# Patient Record
Sex: Female | Born: 1945 | Race: White | Hispanic: No | Marital: Married | Smoking: Never smoker
Health system: Southern US, Community
[De-identification: ages and names within clinical notes are randomized; demographics above are authoritative.]

## PROBLEM LIST (undated history)

## (undated) DIAGNOSIS — K635 Polyp of colon: Secondary | ICD-10-CM

## (undated) DIAGNOSIS — O00109 Unspecified tubal pregnancy without intrauterine pregnancy: Secondary | ICD-10-CM

## (undated) DIAGNOSIS — K5790 Diverticulosis of intestine, part unspecified, without perforation or abscess without bleeding: Secondary | ICD-10-CM

## (undated) DIAGNOSIS — N6019 Diffuse cystic mastopathy of unspecified breast: Secondary | ICD-10-CM

## (undated) DIAGNOSIS — N952 Postmenopausal atrophic vaginitis: Secondary | ICD-10-CM

## (undated) DIAGNOSIS — E039 Hypothyroidism, unspecified: Secondary | ICD-10-CM

## (undated) DIAGNOSIS — K5792 Diverticulitis of intestine, part unspecified, without perforation or abscess without bleeding: Secondary | ICD-10-CM

## (undated) DIAGNOSIS — I10 Essential (primary) hypertension: Secondary | ICD-10-CM

## (undated) DIAGNOSIS — E785 Hyperlipidemia, unspecified: Secondary | ICD-10-CM

## (undated) DIAGNOSIS — T4145XA Adverse effect of unspecified anesthetic, initial encounter: Secondary | ICD-10-CM

## (undated) DIAGNOSIS — M8580 Other specified disorders of bone density and structure, unspecified site: Secondary | ICD-10-CM

## (undated) HISTORY — DX: Hyperlipidemia, unspecified: E78.5

## (undated) HISTORY — PX: ECTOPIC PREGNANCY SURGERY: SHX613

## (undated) HISTORY — DX: Postmenopausal atrophic vaginitis: N95.2

## (undated) HISTORY — PX: OTHER SURGICAL HISTORY: SHX169

## (undated) HISTORY — DX: Hypothyroidism, unspecified: E03.9

## (undated) HISTORY — PX: FERTILITY SURGERY: SHX945

## (undated) HISTORY — DX: Diverticulosis of intestine, part unspecified, without perforation or abscess without bleeding: K57.90

## (undated) HISTORY — DX: Essential (primary) hypertension: I10

## (undated) HISTORY — DX: Diverticulitis of intestine, part unspecified, without perforation or abscess without bleeding: K57.92

## (undated) HISTORY — DX: Diffuse cystic mastopathy of unspecified breast: N60.19

## (undated) HISTORY — DX: Other specified disorders of bone density and structure, unspecified site: M85.80

## (undated) HISTORY — PX: PARTIAL COLECTOMY: SHX5273

## (undated) HISTORY — DX: Unspecified tubal pregnancy without intrauterine pregnancy: O00.109

## (undated) HISTORY — DX: Polyp of colon: K63.5

---

## 1979-07-31 DIAGNOSIS — T8859XA Other complications of anesthesia, initial encounter: Secondary | ICD-10-CM

## 1979-07-31 HISTORY — DX: Other complications of anesthesia, initial encounter: T88.59XA

## 1985-11-29 HISTORY — PX: WRIST FRACTURE SURGERY: SHX121

## 1998-07-17 ENCOUNTER — Ambulatory Visit: Admission: RE | Admit: 1998-07-17 | Discharge: 1998-07-17 | Payer: Self-pay | Admitting: Internal Medicine

## 1999-02-04 ENCOUNTER — Other Ambulatory Visit: Admission: RE | Admit: 1999-02-04 | Discharge: 1999-02-04 | Payer: Self-pay | Admitting: Internal Medicine

## 1999-07-23 ENCOUNTER — Ambulatory Visit (HOSPITAL_COMMUNITY): Admission: RE | Admit: 1999-07-23 | Discharge: 1999-07-23 | Payer: Self-pay | Admitting: Internal Medicine

## 1999-07-23 ENCOUNTER — Encounter: Payer: Self-pay | Admitting: Internal Medicine

## 1999-07-27 ENCOUNTER — Ambulatory Visit (HOSPITAL_COMMUNITY): Admission: RE | Admit: 1999-07-27 | Discharge: 1999-07-27 | Payer: Self-pay | Admitting: Internal Medicine

## 1999-07-27 ENCOUNTER — Encounter: Payer: Self-pay | Admitting: Internal Medicine

## 2000-02-09 ENCOUNTER — Other Ambulatory Visit: Admission: RE | Admit: 2000-02-09 | Discharge: 2000-02-09 | Payer: Self-pay | Admitting: Internal Medicine

## 2000-02-10 ENCOUNTER — Encounter: Payer: Self-pay | Admitting: Internal Medicine

## 2000-02-10 ENCOUNTER — Emergency Department (HOSPITAL_COMMUNITY): Admission: EM | Admit: 2000-02-10 | Discharge: 2000-02-10 | Payer: Self-pay | Admitting: Emergency Medicine

## 2000-10-10 ENCOUNTER — Encounter: Admission: RE | Admit: 2000-10-10 | Discharge: 2000-10-10 | Payer: Self-pay | Admitting: Internal Medicine

## 2000-10-10 ENCOUNTER — Encounter: Payer: Self-pay | Admitting: Internal Medicine

## 2001-01-24 ENCOUNTER — Other Ambulatory Visit: Admission: RE | Admit: 2001-01-24 | Discharge: 2001-01-24 | Payer: Self-pay | Admitting: Internal Medicine

## 2001-02-23 ENCOUNTER — Encounter (INDEPENDENT_AMBULATORY_CARE_PROVIDER_SITE_OTHER): Payer: Self-pay | Admitting: Specialist

## 2001-02-23 ENCOUNTER — Other Ambulatory Visit: Admission: RE | Admit: 2001-02-23 | Discharge: 2001-02-23 | Payer: Self-pay | Admitting: Internal Medicine

## 2001-11-29 HISTORY — PX: BREAST CYST ASPIRATION: SHX578

## 2002-01-12 ENCOUNTER — Other Ambulatory Visit: Admission: RE | Admit: 2002-01-12 | Discharge: 2002-01-12 | Payer: Self-pay | Admitting: Internal Medicine

## 2002-01-18 ENCOUNTER — Encounter: Admission: RE | Admit: 2002-01-18 | Discharge: 2002-01-18 | Payer: Self-pay | Admitting: Internal Medicine

## 2002-01-18 ENCOUNTER — Encounter: Payer: Self-pay | Admitting: Internal Medicine

## 2002-02-20 ENCOUNTER — Encounter: Admission: RE | Admit: 2002-02-20 | Discharge: 2002-02-20 | Payer: Self-pay | Admitting: Internal Medicine

## 2002-02-20 ENCOUNTER — Encounter: Payer: Self-pay | Admitting: Internal Medicine

## 2002-04-10 ENCOUNTER — Encounter: Payer: Self-pay | Admitting: Internal Medicine

## 2002-04-10 ENCOUNTER — Encounter: Admission: RE | Admit: 2002-04-10 | Discharge: 2002-04-10 | Payer: Self-pay | Admitting: Internal Medicine

## 2002-07-02 ENCOUNTER — Inpatient Hospital Stay (HOSPITAL_COMMUNITY): Admission: AD | Admit: 2002-07-02 | Discharge: 2002-07-03 | Payer: Self-pay | Admitting: Internal Medicine

## 2002-07-02 ENCOUNTER — Encounter: Payer: Self-pay | Admitting: Internal Medicine

## 2002-07-03 ENCOUNTER — Encounter: Payer: Self-pay | Admitting: Cardiology

## 2002-07-06 ENCOUNTER — Encounter: Admission: RE | Admit: 2002-07-06 | Discharge: 2002-07-06 | Payer: Self-pay | Admitting: Internal Medicine

## 2002-07-06 ENCOUNTER — Encounter: Payer: Self-pay | Admitting: Internal Medicine

## 2002-10-24 ENCOUNTER — Encounter: Admission: RE | Admit: 2002-10-24 | Discharge: 2002-10-24 | Payer: Self-pay | Admitting: Internal Medicine

## 2002-10-24 ENCOUNTER — Encounter: Payer: Self-pay | Admitting: Internal Medicine

## 2003-02-18 ENCOUNTER — Other Ambulatory Visit: Admission: RE | Admit: 2003-02-18 | Discharge: 2003-02-18 | Payer: Self-pay | Admitting: Internal Medicine

## 2005-04-02 ENCOUNTER — Other Ambulatory Visit: Admission: RE | Admit: 2005-04-02 | Discharge: 2005-04-02 | Payer: Self-pay | Admitting: Internal Medicine

## 2005-09-02 ENCOUNTER — Encounter: Admission: RE | Admit: 2005-09-02 | Discharge: 2005-09-02 | Payer: Self-pay | Admitting: Internal Medicine

## 2005-09-14 ENCOUNTER — Encounter: Admission: RE | Admit: 2005-09-14 | Discharge: 2005-09-14 | Payer: Self-pay | Admitting: Internal Medicine

## 2006-05-30 ENCOUNTER — Encounter: Admission: RE | Admit: 2006-05-30 | Discharge: 2006-05-30 | Payer: Self-pay | Admitting: Internal Medicine

## 2006-05-31 ENCOUNTER — Other Ambulatory Visit: Admission: RE | Admit: 2006-05-31 | Discharge: 2006-05-31 | Payer: Self-pay | Admitting: Internal Medicine

## 2007-06-13 ENCOUNTER — Other Ambulatory Visit: Admission: RE | Admit: 2007-06-13 | Discharge: 2007-06-13 | Payer: Self-pay | Admitting: Internal Medicine

## 2007-10-04 ENCOUNTER — Encounter: Admission: RE | Admit: 2007-10-04 | Discharge: 2007-10-04 | Payer: Self-pay | Admitting: Internal Medicine

## 2007-10-04 LAB — HM MAMMOGRAPHY

## 2009-01-02 ENCOUNTER — Ambulatory Visit: Payer: Self-pay | Admitting: Internal Medicine

## 2009-04-14 ENCOUNTER — Ambulatory Visit: Payer: Self-pay | Admitting: Internal Medicine

## 2010-02-27 ENCOUNTER — Ambulatory Visit: Payer: Self-pay | Admitting: Internal Medicine

## 2010-07-06 ENCOUNTER — Encounter (INDEPENDENT_AMBULATORY_CARE_PROVIDER_SITE_OTHER): Payer: Self-pay | Admitting: *Deleted

## 2010-12-14 ENCOUNTER — Other Ambulatory Visit
Admission: RE | Admit: 2010-12-14 | Discharge: 2010-12-14 | Payer: Self-pay | Source: Home / Self Care | Admitting: Internal Medicine

## 2010-12-14 ENCOUNTER — Ambulatory Visit
Admission: RE | Admit: 2010-12-14 | Discharge: 2010-12-14 | Payer: Self-pay | Source: Home / Self Care | Attending: Internal Medicine | Admitting: Internal Medicine

## 2010-12-14 LAB — HM PAP SMEAR

## 2010-12-29 NOTE — Letter (Signed)
Summary: Colonoscopy Letter  [facility]  [address] [place], [place] [address]   Phone: [phone]  Fax: [phone]      July 06, 2010 MRN: [id number]   [facility] [address] [place], [place]  [address]   Dear Ms. Moten,   According to your medical record, it is time for you to schedule a Colonoscopy. The American Cancer Society recommends this procedure as a method to detect early colon cancer. Patients with a family history of colon cancer, or a personal history of colon polyps or inflammatory bowel disease are at increased risk.  This letter has been generated based on the recommendations made at the time of your procedure. If you feel that in your particular situation this may no longer apply, please contact our office.  Please call our office at [phone] to schedule this appointment or to update your records at your earliest convenience.  Thank you for cooperating with [place] to provide you with the very best care possible.   Sincerely,  Hedwig Morton. Juanda Chance, M.D.  [facility] [phone]

## 2011-04-16 NOTE — Discharge Summary (Signed)
NAME:  Pamela [name], Pamela [name]                         ACCOUNT NO.:  [id number]   MEDICAL RECORD NO.:  [id number]                   PATIENT TYPE:  INP   LOCATION:  2002                                 FACILITY:  [facility]   PHYSICIAN:  Luanna Cole. Lenord Fellers, M.D.                DATE OF BIRTH:  09/22/1946   DATE OF ADMISSION:  07/02/2002  DATE OF DISCHARGE:  07/03/2002                                 DISCHARGE SUMMARY   FINAL DIAGNOSES:  1. Chest pain, myocardial infarction ruled out.  2. Probable gastroesophageal reflux.  3. Hypothyroidism.  4. History of recurrent diverticulitis.  5. Fibrocystic breast disease.   CONSULTATIONS:  [facility].   HISTORY OF PRESENT ILLNESS:  This 65 year old white female with no prior  history of cardiac problems or chest pain had onset of chest pain 06/20/02,  after eating cherries.  She thought initially it was indigestion, but has  had recurrent episodes of chest pain and pressure lasting up to 1/2 hour.  There was no associated diaphoresis, nausea or vomiting.  Pain was noted to  be at rest.  She awakened on the morning of 06/30/02, with chest pain after a  good night's sleep.  She had eaten pizza the night before.  For two days  prior to admission she had some four or five episodes of chest pain.  On the  day of admission, she had onset of chest pain at 11 a.m.  She was  subsequently admitted to rule out myocardial infarction.  She was noted to  have an approximate 1 mm ST depression in leads 2 and F.  She has a prior  history of hypothyroidism, and is treated with Synthroid 0.112 mg daily.  She is on Estrogen replacement consisting of Prempro 0.625 mg/2.5 mg daily.  We have previously discussed discontinuing it, but we have decided that she  should continue with Estrogen at this point since she is in early menopause.  She takes multivitamin and a calcium supplement.  She has a history of  diverticulitis diagnosed in 3/01, and has occasional recurrent  episodes.  History of fibrocystic breast disease.  Had tonsillectomy in 1955.  Had a  right tubal ectopic pregnancy in 1982.  EA left tubal ectopic pregnancy in  1993, and another right tubal ectopic pregnancy in 1986.  Father has a  history of adult onset diabetes.  He died with a CVA, but had a history of  coronary artery disease and was status post bypass surgery.  Mother had a  history of hypertension, but no coronary artery disease.   HOSPITAL COURSE:  The patient was admitted to a telemetry unit.  Pulse  oximetry was noted to be 95%.  Blood pressure was 140/80, pulse was 72 and  regular.  CK-MB fractions and troponin-I were obtained, and proved to be  within normal limits.  The patient was seen in consultation by Dr. Samule Ohm  cardiologist  with [facility].  He felt that the patient had a good  history for unstable angina.  He suggested we place her on subcutaneous  Lovenox, and agreed with obtaining serial enzymes.  He also placed her on  aspirin and a beta blocker.  I also placed the patient on Protonix 40 mg  daily.  The evening of admission, she was resting in her room and complained  of chest tightness at 2145.  She was given sublingual nitroglycerin with  some relief.  Blood pressure was 170/100.  She was given a second  nitroglycerin tablet.  At 2203 hours, she complained of recurrent chest  tightness.  Oxygen was applied.  She was given IV Demerol and Phenergan.  The day following admission, the patient underwent a Cardiolite study.  She  had no arrhythmias during her entire hospital course.  The Cardiolite study  showed no evidence of ischemia, and her ejection fraction was noted to be  66%.  It was felt that she was stable for discharge.  Thus, she was  discharged home in the early afternoon of 07/03/02, on Protonix 40 mg daily.  The patient was advised  not to eat spicy food and to not eat late at night.  We told the patient we  would do an upper GI as an outpatient  later in the week, and she should  follow up with me one week after discharge.  She is to continue with  Synthroid 0.112 mg daily.                                               Luanna Cole. Lenord Fellers, M.D.    MJB/MEDQ  D:  08/14/2002  T:  08/15/2002  Job:  [id number]   cc:   [facility]

## 2011-04-16 NOTE — Consult Note (Signed)
NAME:  Pamela [name], Pamela [name]                         ACCOUNT NO.:  [id number]   MEDICAL RECORD NO.:  [id number]                   PATIENT TYPE:  INP   LOCATION:  2002                                 FACILITY:  [facility]   PHYSICIAN:  Salvadore Farber, MD [facility]            DATE OF BIRTH:  15-Apr-1946   DATE OF CONSULTATION:  DATE OF DISCHARGE:                              CARDIOLOGY CONSULTATION   CHIEF COMPLAINT:  Consulted by Dr. Lenord Fellers for assistance in the evaluation  and management of chest pain.   HISTORY OF PRESENT ILLNESS:  Pamela [name] is a 65 year old lady without prior  cardiac disease.  She has atherosclerotic risk factors only of positive  family history and current hormone-replacement therapy.  Since June 20, 2002, she has and multiple episodes of substernal chest pain. She has  attributed these variously to something she ate or ill-fitting clothes.  All  have occurred at rest or with minimal exertion.  She has had no pain during  several walks of up to 20 minutes with her husband.  The pain has typically  lasted up to 30 minutes but not longer.  It does not radiate and has not  been associated with diaphoresis, nausea, palpitations, syncope, or  presyncope.  Her last pain was at 11 o'clock this morning while seated at  her desk.   PAST MEDICAL HISTORY:  Hypothyroidism, diverticulitis, fibrocystic breast  disease, status post right tubal ectopic pregnancy in 1982, status post  tonsillectomy.   MEDICATIONS:  Synthroid, Prempro, multivitamins, calcium supplement.   ALLERGIES:  No known drug allergies   SOCIAL HISTORY:  She is a married Airline pilot, drinks occasional alcohol, no  tobacco.   FAMILY HISTORY:  Father had coronary artery bypass grafting.  Mother had  lung cancer.   REVIEW OF SYSTEMS:  Negative in detail except as above.   PHYSICAL EXAMINATION:  GENERAL:  This is a thin, well-appearing woman in no  distress.  VITAL SIGNS:  Heart rate 64, blood pressure 170/94,  temperature 97.5, and  oxygen saturation of 100% on room air.  NECK:  She has no lymphadenopathy.  There is no jugular venous distention.  LUNGS:  Clear to auscultation and percussion bilaterally.  HEART:  She has a nondisplaced maximal cardiac impulse.  Regular rate and  rhythm.  There is no murmur, rub, S3, or S4.  ABDOMEN:  Soft, nondistended, nontender.  There is no hepatosplenomegaly.  Bowel sounds are normal.  EXTREMITIES:  Without clubbing, cyanosis, or edema.  They are warm.  PULSES:  Carotid pulses are 2+ bilaterally without bruits.  Femoral pulses 2+  bilaterally without bruit. Dorsalis pedis pulses are 2+ bilaterally.   LABORATORY DATA:  All laboratories are pending.   Chest x-ray: Pending.   EKG:  Normal sinus rhythm with inferolateral ST depressions of up to 1 mm  which is slightly more pronounced than a study obtained on January 12, 2002.  IMPRESSION:  Pamela [name] is a 65 year old lady with minimal risk factors for  atherosclerotic disease who presents with multiple episodes of substernal  chest discomfort over the past several weeks.  Despite her minimal risk  factors, these remain concerning for unstable angina.  Her EKG is clearly  abnormal though not substantially changed from previously.   PLAN:  Agree with obtaining serial enzymes to exclude myocardial infarction.  Agree with aspirin and beta blocker.  Given the multiple episodes of rest  pain including one this morning and the abnormal EKG, I would add  anticoagulation with enoxaparin.  Will plan ETT Cardiolite for the morning.                                                Salvadore Farber, MD [facility]    WED/MEDQ  D:  07/02/2002  T:  07/06/2002  Job:  [id number]

## 2011-04-16 NOTE — H&P (Signed)
NAME:  Pamela [name], Pamela [name]                         ACCOUNT NO.:  [id number]   MEDICAL RECORD NO.:  [id number]                   PATIENT TYPE:  INP   LOCATION:  2002                                 FACILITY:  [facility]   PHYSICIAN:  Luanna Cole. Lenord Fellers, M.D.                DATE OF BIRTH:  12/28/1945   DATE OF ADMISSION:  07/02/2002  DATE OF DISCHARGE:                                HISTORY & PHYSICAL   CHIEF COMPLAINT:  Chest tightness.   HISTORY OF PRESENT ILLNESS:  This 65 year old white female with no prior  history of cardiac problems or chest pain had onset of chest pain June 20, 2002 after eating cherries.  She thought it was indigestion but has had  recurrent chest pain and pressure lasting up to about 1/2 hour over the past  1 to 1-1/2 weeks.  No diaphoresis or nausea and vomiting.  The pain is in  the center of her chest and is described as more of a tightness rather than  a frank severe pain.  She awakened on the morning of August 2nd with chest  pain after a good nights sleep.  She had eaten pizza the night before.  She  has had at least 4 or 5 episodes over the past two days.  Around 11 a.m. she  had another episode today.  She became alarmed and came to my office.  She  is now admitted to rule out MI.  She has about a 1 mm ST depression in leads  II and aVF.   PAST MEDICAL HISTORY:  History of hypothyroidism treated with Synthroid  0.112 mg per day.  History of estrogen replacement currently on Prempro  0.625 mg/2.5 mg daily.  Takes a multivitamin daily, takes calcium  supplement.  History of diverticulitis in March of 2001 and has had  occasional recurrent episodes.  None of these have required hospitalization.  History of fibrocystic breast disease.  Had tonsillectomy in 1955.  She has  had several tubal ectopic pregnancies, one on the right in 1982, one on the  left in 1983, and another one on the right in 1986.   SOCIAL HISTORY:  She is married.  This is her second marriage.   Husband is  employed as a Heritage manager for [employer] and also is a Careers information officer.  Nonsmoker.  Occasional wine consumption.  She is an account/CPA.   FAMILY HISTORY:  Father died of a CVA with history of adult-onset diabetes  mellitus, coronary artery disease, and was status post coronary artery  bypass surgery x4.  Mother died of lung cancer with history of hypertension.  Three brothers in good health, one sister who has an elevated ANA titer.   PHYSICAL EXAMINATION:  VITAL SIGNS:  Temperature 98.6 degrees orally, pulse  72 regular, blood pressure 140/80, weight 133 pounds.  Pulse oximetry 95% on  room air.  SKIN:  Warm and dry.  NODES:  None.  HEENT:  Head is normocephalic, atraumatic.  Sclerae and conjunctivae are  clear.  TMs and pharynx are clear.  NECK:  Supple without JVD, thyromegaly or carotid bruits.  CHEST:  Clear to auscultation.  CARDIAC:  Regular rate and rhythm, normal S1 & S2 without murmurs.  BREASTS:  Fibrocystic changes bilaterally without discrete masses.  ABDOMEN:  Bowel sounds active.  No hepatosplenomegaly, masses, or  tenderness.  EXTREMITIES:  Without edema.  NEUROLOGICAL:  No focal deficits.   IMPRESSION:  Increasing number of episodes of chest tightness rule out  myocardial infarction question of unstable angina question of  gastrointestinal reflux.   PLAN:  The patient will be admitted to telemetry, serial CK-MB fractions  will be obtained along with troponin-I fraction. Cardiology consultation  will be obtained as well.  The patient will be placed on aspirin and beta  blocker.  Consideration will be given to heparin.                                               Luanna Cole. Lenord Fellers, M.D.    MJB/MEDQ  D:  07/02/2002  T:  07/06/2002  Job:  [id number]

## 2011-05-11 ENCOUNTER — Other Ambulatory Visit: Payer: Self-pay | Admitting: Internal Medicine

## 2011-05-11 DIAGNOSIS — Z780 Asymptomatic menopausal state: Secondary | ICD-10-CM

## 2011-05-11 DIAGNOSIS — Z1231 Encounter for screening mammogram for malignant neoplasm of breast: Secondary | ICD-10-CM

## 2011-05-11 DIAGNOSIS — M8580 Other specified disorders of bone density and structure, unspecified site: Secondary | ICD-10-CM

## 2011-06-03 ENCOUNTER — Ambulatory Visit
Admission: RE | Admit: 2011-06-03 | Discharge: 2011-06-03 | Disposition: A | Discharge status: Home / Self Care | Payer: Managed Care, Other (non HMO) | Source: Ambulatory Visit | Attending: Internal Medicine | Admitting: Internal Medicine

## 2011-06-03 DIAGNOSIS — M8580 Other specified disorders of bone density and structure, unspecified site: Secondary | ICD-10-CM

## 2011-06-03 DIAGNOSIS — Z780 Asymptomatic menopausal state: Secondary | ICD-10-CM

## 2011-06-03 DIAGNOSIS — Z1231 Encounter for screening mammogram for malignant neoplasm of breast: Secondary | ICD-10-CM

## 2011-06-04 ENCOUNTER — Encounter: Payer: Self-pay | Admitting: Internal Medicine

## 2011-06-10 ENCOUNTER — Encounter: Payer: Self-pay | Admitting: Internal Medicine

## 2011-06-14 ENCOUNTER — Other Ambulatory Visit: Payer: Managed Care, Other (non HMO) | Admitting: Internal Medicine

## 2011-06-14 DIAGNOSIS — E785 Hyperlipidemia, unspecified: Secondary | ICD-10-CM

## 2011-06-14 DIAGNOSIS — E039 Hypothyroidism, unspecified: Secondary | ICD-10-CM

## 2011-06-14 LAB — LIPID PANEL
Cholesterol: 172 mg/dL (ref 0–200)
HDL: 62 mg/dL (ref >39)
Total CHOL/HDL Ratio: 2.8 Ratio

## 2011-06-14 LAB — HEPATIC FUNCTION PANEL
ALT: 9 U/L (ref 0–35)
AST: 16 U/L (ref 0–37)
Albumin: 4.1 g/dL (ref 3.5–5.2)

## 2011-06-14 LAB — TSH: TSH: 0.308 u[IU]/mL — ABNORMAL LOW (ref 0.350–4.500)

## 2011-06-15 ENCOUNTER — Encounter: Payer: Self-pay | Admitting: Internal Medicine

## 2011-06-15 ENCOUNTER — Ambulatory Visit (INDEPENDENT_AMBULATORY_CARE_PROVIDER_SITE_OTHER): Payer: Managed Care, Other (non HMO) | Admitting: Internal Medicine

## 2011-06-15 VITALS — BP 138/78 | HR 64 | Temp 98.2°F | Ht 62.0 in | Wt 132.0 lb

## 2011-06-15 DIAGNOSIS — K5792 Diverticulitis of intestine, part unspecified, without perforation or abscess without bleeding: Secondary | ICD-10-CM

## 2011-06-15 DIAGNOSIS — N6019 Diffuse cystic mastopathy of unspecified breast: Secondary | ICD-10-CM

## 2011-06-15 DIAGNOSIS — Z8719 Personal history of other diseases of the digestive system: Secondary | ICD-10-CM

## 2011-06-15 DIAGNOSIS — M8580 Other specified disorders of bone density and structure, unspecified site: Secondary | ICD-10-CM

## 2011-06-15 DIAGNOSIS — K5732 Diverticulitis of large intestine without perforation or abscess without bleeding: Secondary | ICD-10-CM

## 2011-06-15 DIAGNOSIS — G4700 Insomnia, unspecified: Secondary | ICD-10-CM

## 2011-06-15 DIAGNOSIS — E039 Hypothyroidism, unspecified: Secondary | ICD-10-CM

## 2011-06-15 DIAGNOSIS — M899 Disorder of bone, unspecified: Secondary | ICD-10-CM

## 2011-06-15 DIAGNOSIS — E785 Hyperlipidemia, unspecified: Secondary | ICD-10-CM

## 2011-06-15 NOTE — Progress Notes (Signed)
  Subjective:    Patient ID: Pamela [name], female    DOB: [birth year], 65 y.o.   MRN: [id number]  HPI patient with history of hypothyroidism, hypertension, osteopenia, recurrent diverticulitis, hyperlipidemia, vaginal atrophy, fibrocystic breast disease for six-month recheck. Is on Synthroid 0.1 mg daily. TSH drawn. In 2010 prescribed Zocor 10 mg daily for her. She really doesn't want to take it. Had Zostavax vaccine January 2012, colonoscopy 2004 and Dr. Juanda Chance recommended repeat study in 7 years. Recently she had a recurrent bout of diverticulitis. She basically suffered through it and it resolved without antibiotics. She was out of Cipro and Flagyl and did not contact the options for evaluation or for prescription. History of left wrist fracture 1987. Admitted in 2001 with acute sigmoid diverticulitis. Tdap Vaccine given in fall 2011. Recent bone density study July 2012 showed a T score in the LS spine of -1.7 and in the femoral neck -2.1. The scores are very stable from her previous scores in 2006 and 2008. She is no longer on oral estrogen replacement but does have prescription for Premarin vaginal cream to use 3 times weekly. Is supposed to be taking calcium and vitamin D. Last physical exam January 2012 history of insomnia and takes Ambien 5 mg sparingly on a when necessary basis.     Review of Systems     Objective:   Physical Exam neck is supple, no thyromegaly; chest clear; cardiac exam regular rate and rhythm normal S1 and S2;         Assessment & Plan:  Diverticulitis-recurrent: Prescribed Cipro 500 mg #20 one by mouth twice a day for 10 days and Flagyl 500 mg #20 one by mouth twice a day for 10 days should episode recur. This is long-standing for many years with her. Due for repeat colonoscopy and she should check with gastroenterology office.  Hypothyroidism. Refill Synthroid 0.1 mg daily for 6 months  Osteopenia-recommend calcium 1200 mg daily and vitamin D 3 2000 units daily by  mouth  Hyperlipidemia-diet controlled; patient refuses statin medication  Hypertension: Currently not on any medication. Was diagnosed in 2004 and she took all taste for a brief period of time.  Insomnia-- refill Ambien 10 mg (#30) 1/2-1 by mouth each bedtime when necessary sleep  Fibrocystic breast disease: Recommend annual mammogram  Return January 2013 for physical exam

## 2011-06-22 ENCOUNTER — Encounter: Payer: Self-pay | Admitting: Internal Medicine

## 2011-06-26 DIAGNOSIS — M8580 Other specified disorders of bone density and structure, unspecified site: Secondary | ICD-10-CM | POA: Insufficient documentation

## 2011-06-26 DIAGNOSIS — N6019 Diffuse cystic mastopathy of unspecified breast: Secondary | ICD-10-CM | POA: Insufficient documentation

## 2011-06-26 DIAGNOSIS — E039 Hypothyroidism, unspecified: Secondary | ICD-10-CM | POA: Insufficient documentation

## 2011-06-26 DIAGNOSIS — E785 Hyperlipidemia, unspecified: Secondary | ICD-10-CM | POA: Insufficient documentation

## 2011-06-26 DIAGNOSIS — G4700 Insomnia, unspecified: Secondary | ICD-10-CM | POA: Insufficient documentation

## 2011-12-07 ENCOUNTER — Other Ambulatory Visit: Payer: Managed Care, Other (non HMO) | Admitting: Internal Medicine

## 2011-12-07 DIAGNOSIS — E7800 Pure hypercholesterolemia, unspecified: Secondary | ICD-10-CM

## 2011-12-07 DIAGNOSIS — E039 Hypothyroidism, unspecified: Secondary | ICD-10-CM

## 2011-12-07 DIAGNOSIS — Z0000 Encounter for general adult medical examination without abnormal findings: Secondary | ICD-10-CM

## 2011-12-07 LAB — COMPREHENSIVE METABOLIC PANEL
AST: 26 U/L (ref 0–37)
Albumin: 4.8 g/dL (ref 3.5–5.2)
Alkaline Phosphatase: 58 U/L (ref 39–117)
BUN: 16 mg/dL (ref 6–23)
Potassium: 4.5 mEq/L (ref 3.5–5.3)
Sodium: 138 mEq/L (ref 135–145)
Total Bilirubin: 0.7 mg/dL (ref 0.3–1.2)

## 2011-12-07 LAB — TSH: TSH: 1.579 u[IU]/mL (ref 0.350–4.500)

## 2011-12-07 LAB — CBC WITH DIFFERENTIAL/PLATELET
Basophils Absolute: 0 10*3/uL (ref 0.0–0.1)
Basophils Relative: 0 % (ref 0–1)
MCHC: 32.7 g/dL (ref 30.0–36.0)
Monocytes Absolute: 0.3 10*3/uL (ref 0.1–1.0)
Neutro Abs: 3 10*3/uL (ref 1.7–7.7)
Neutrophils Relative %: 61 % (ref 43–77)
RDW: 13.3 % (ref 11.5–15.5)

## 2011-12-07 LAB — LIPID PANEL
HDL: 85 mg/dL (ref >39)
LDL Cholesterol: 101 mg/dL — ABNORMAL HIGH (ref 0–99)
VLDL: 19 mg/dL (ref 0–40)

## 2011-12-09 ENCOUNTER — Ambulatory Visit (INDEPENDENT_AMBULATORY_CARE_PROVIDER_SITE_OTHER): Payer: Managed Care, Other (non HMO) | Admitting: Internal Medicine

## 2011-12-09 ENCOUNTER — Encounter: Payer: Self-pay | Admitting: Internal Medicine

## 2011-12-09 VITALS — BP 142/84 | HR 72 | Temp 98.4°F | Ht 61.75 in | Wt 138.0 lb

## 2011-12-09 DIAGNOSIS — E785 Hyperlipidemia, unspecified: Secondary | ICD-10-CM

## 2011-12-09 DIAGNOSIS — Z0000 Encounter for general adult medical examination without abnormal findings: Secondary | ICD-10-CM

## 2011-12-09 DIAGNOSIS — E039 Hypothyroidism, unspecified: Secondary | ICD-10-CM

## 2011-12-09 LAB — POCT URINALYSIS DIPSTICK
Bilirubin, UA: NEGATIVE
Glucose, UA: NEGATIVE
Leukocytes, UA: NEGATIVE
Nitrite, UA: NEGATIVE

## 2011-12-09 MED ORDER — LEVOTHYROXINE SODIUM 100 MCG PO TABS: 100.0000 ug | ORAL_TABLET | Freq: Every day | ORAL | Status: DC

## 2011-12-09 MED ORDER — SIMVASTATIN 10 MG PO TABS: 10.0000 mg | ORAL_TABLET | Freq: Every day | ORAL | Status: DC

## 2011-12-16 ENCOUNTER — Other Ambulatory Visit: Payer: Self-pay

## 2011-12-28 ENCOUNTER — Other Ambulatory Visit: Payer: Self-pay

## 2011-12-28 DIAGNOSIS — E039 Hypothyroidism, unspecified: Secondary | ICD-10-CM

## 2011-12-28 DIAGNOSIS — E785 Hyperlipidemia, unspecified: Secondary | ICD-10-CM

## 2011-12-28 MED ORDER — LEVOTHYROXINE SODIUM 100 MCG PO TABS: 100.0000 ug | ORAL_TABLET | Freq: Every day | ORAL | Status: DC

## 2011-12-28 MED ORDER — SIMVASTATIN 10 MG PO TABS: 10.0000 mg | ORAL_TABLET | Freq: Every day | ORAL | Status: DC

## 2012-02-12 NOTE — Patient Instructions (Signed)
Continue same medications and return in 6 months 

## 2012-02-12 NOTE — Progress Notes (Signed)
Subjective:    Patient ID: Pamela [name], female    DOB: 1946-07-10, 66 y.o.   MRN: [id number]  HPI  66 year old white female with history of hypothyroidism, fibrocystic breast disease, hypertension, osteopenia, recurrent diverticulitis, hyperlipidemia, history of positive ANA with a titer of 1:160, history of vaginal atrophy for health maintenance exam. Patient saw Dr. Phylliss [name] in 30-Apr-1995 for evaluation of positive ANA. 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  Diagnosed with hypothyroidism in 04/29/1992. TSH was 33.3 when she was diagnosed.  Patient has history of left wrist fracture 04/29/86, has had recurrent urinary tract infection from time to time,  had sigmoid diverticulitis proven March 2001. Would have intermittent bouts of diverticulitis for which she would take Cipro and Flagyl. Sometimes would have these while traveling out of the country. Gets annual mammogram. Could not tolerate Zocor.  Colonoscopy September 2004 by Dr. Juanda [name], Zostavax vaccine January 2012, Tdap Fall 2011.  Social history: This is her second marriage. She is currently retired. Enjoys gardening. 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 No smoking history. Occasional wine consumption.  Family history father died in 04-29-2000 with history of coronary artery disease, CVA, MI, diabetes. Mother died at age 43 in 84 with history of hypertension and lung cancer. 3 brothers and one sister in good health. No children.  Last bone density study July 2012 showed a T score in the LS spine of -1.7 and femoral neck -2.1. Patient does not want to be on bone sparing medication. Currently on calcium and vitamin D.  Hospitalized with chest pain August 2003. MI was ruled out. 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 Subsequently had cardiac stress test 07/03/2002 which was negative. No further recurrence.  History of ectopic pregnancies in 1982, 1983, 04-29-84 resulting in laparotomy each time. Patient had tonsillectomy around  [phone]. In 1983-04-30 patient had tubal repair laparotomy. Fractured wrist in 1987 had to be repaired surgically.  Has been patient in this practice since October 1991.      Review of Systems  Constitutional: Negative.   HENT: Negative.   Eyes: Negative.   Cardiovascular: Negative.   Gastrointestinal: Negative.   Genitourinary: Negative.   Musculoskeletal: Negative.   Neurological: Negative.   Hematological: Negative.   Psychiatric/Behavioral: Negative.        Objective:   Physical Exam  Vitals reviewed. Constitutional: She is oriented to person, place, and time. She appears well-developed and well-nourished. No distress.  HENT:  Head: Normocephalic and atraumatic.  Right Ear: External ear normal.  Left Ear: External ear normal.  Mouth/Throat: Oropharynx is clear and moist.  Eyes: Conjunctivae and EOM are normal. Pupils are equal, round, and reactive to light. Right eye exhibits no discharge. Left eye exhibits no discharge. No scleral icterus.  Neck: Normal range of motion. Neck supple. No JVD present. No thyromegaly present.  Cardiovascular: Normal rate, regular rhythm, normal heart sounds and intact distal pulses.   No murmur heard. Pulmonary/Chest: Effort normal and breath sounds normal. She has no rales.       Breast exam fibrocystic changes  Abdominal: Soft. Bowel sounds are normal. She exhibits no mass. There is no rebound and no guarding.  Genitourinary:       Bimanual normal. Pap done 12/15/2010  Musculoskeletal: Normal range of motion. She exhibits no edema.  Lymphadenopathy:    She has no cervical adenopathy.  Neurological: She is alert and oriented to person, place, and time. She has normal reflexes. No cranial nerve deficit.  Coordination normal.  Skin: Skin is warm and dry. No rash noted. She is not diaphoretic.  Psychiatric: She has a normal mood and affect. Judgment and thought content normal.          Assessment & Plan:  Hypothyroidism  History of  positive ANA with a titer of 1:160 thought to be possible fibromyalgia. Lupus ruled out by rheumatologist.  Hypertension  Osteopenia-does not want to be on bone sparing medication last bone density study 2012  Fibrocystic breast disease  Hyperlipidemia-does not tolerate lipid-lowering medication well.  History recurrent diverticulitis  History of multiple ectopic pregnancies  History of vaginal atrophy  History of leg length discrepancy 1995  History of infrequent urinary tract infections  Plan: Return in 6 months for office visit lipid panel TSH.

## 2012-03-14 ENCOUNTER — Encounter: Payer: Self-pay | Admitting: Internal Medicine

## 2012-04-21 ENCOUNTER — Ambulatory Visit (AMBULATORY_SURGERY_CENTER): Payer: Managed Care, Other (non HMO)

## 2012-04-21 VITALS — Ht 62.0 in | Wt 125.0 lb

## 2012-04-21 DIAGNOSIS — Z1211 Encounter for screening for malignant neoplasm of colon: Secondary | ICD-10-CM

## 2012-04-21 MED ORDER — PEG-KCL-NACL-NASULF-NA ASC-C 100 G PO SOLR: 1.0000 | Freq: Once | ORAL | Status: DC

## 2012-04-25 ENCOUNTER — Encounter: Payer: Self-pay | Admitting: Internal Medicine

## 2012-05-05 ENCOUNTER — Ambulatory Visit (AMBULATORY_SURGERY_CENTER): Payer: Managed Care, Other (non HMO) | Admitting: Internal Medicine

## 2012-05-05 ENCOUNTER — Encounter: Payer: Self-pay | Admitting: Internal Medicine

## 2012-05-05 VITALS — BP 165/105 | HR 62 | Temp 97.3°F | Resp 16 | Ht 62.0 in | Wt 138.0 lb

## 2012-05-05 DIAGNOSIS — Z86010 Personal history of colonic polyps: Secondary | ICD-10-CM

## 2012-05-05 DIAGNOSIS — Z1211 Encounter for screening for malignant neoplasm of colon: Secondary | ICD-10-CM

## 2012-05-05 MED ORDER — SODIUM CHLORIDE 0.9 % IV SOLN: 500.0000 mL | INTRAVENOUS | Status: DC

## 2012-05-05 NOTE — Progress Notes (Signed)
Patient did not experience any of the following events: a burn prior to discharge; a fall within the facility; wrong site/side/patient/procedure/implant event; or a hospital transfer or hospital admission upon discharge from the facility. (G8907) Patient did not have preoperative order for IV antibiotic SSI prophylaxis. (G8918)  

## 2012-05-05 NOTE — Op Note (Signed)
[facility] [address] [place], [place]  [address]  COLONOSCOPY PROCEDURE REPORT  PATIENT:  Pamela [name], Pamela [name]  MR#:  [id number] BIRTHDATE:  1946-04-06, 66 yrs. old  GENDER:  female ENDOSCOPIST:  Hedwig Morton. Juanda Chance, MD REF. BY:  Sharlet Salina, M.D. PROCEDURE DATE:  05/05/2012 PROCEDURE:  Colonoscopy 96295 ASA CLASS:  Class II INDICATIONS:  history of polyps colon 2002- inflammatory polyp colon 2004 was normal MEDICATIONS:  None  DESCRIPTION OF PROCEDURE:   After the risks and benefits and of the procedure were explained, informed consent was obtained.  No rectal exam performed. The LB [id number]-[id number] endoscope was introduced through the anus and advanced to the cecum, which was identified by both the appendix and ileocecal valve.  The quality of the prep was good, using MoviPrep.  The instrument was then slowly withdrawn as the colon was fully examined. <<PROCEDUREIMAGES>>  FINDINGS:  Moderate diverticulosis was found (see image1, image2, and image7). multiple diverticuli sigmoid colon, thickened folds, narrow lumen  This was otherwise a normal examination of the colon (see image8, image4, image5, and image3).   Retroflexed views in the rectum revealed no abnormalities.    The scope was then withdrawn from the patient and the procedure completed.  COMPLICATIONS:  None ENDOSCOPIC IMPRESSION: 1) Moderate diverticulosis 2) Otherwise normal examination RECOMMENDATIONS: 1) High fiber diet. matamucil 1 tsp po qd  REPEAT EXAM:  In 10 year(s) for.  ______________________________ Hedwig Morton. Juanda Chance, MD  CC:  n. eSIGNED:   Hedwig Morton. Breniyah Romm at 05/05/2012 10:37 AM  [place], Lurena Joiner, [phone]

## 2012-05-05 NOTE — Patient Instructions (Signed)
YOU HAD AN ENDOSCOPIC PROCEDURE TODAY AT THE [facility]: Refer to the procedure report that was given to you for any specific questions about what was found during the examination.  If the procedure report does not answer your questions, please call your gastroenterologist to clarify.  If you requested that your care partner not be given the details of your procedure findings, then the procedure report has been included in a sealed envelope for you to review at your convenience later.  YOU SHOULD EXPECT: Some feelings of bloating in the abdomen. Passage of more gas than usual.  Walking can help get rid of the air that was put into your GI tract during the procedure and reduce the bloating. If you had a lower endoscopy (such as a colonoscopy or flexible sigmoidoscopy) you may notice spotting of blood in your stool or on the toilet paper. If you underwent a bowel prep for your procedure, then you may not have a normal bowel movement for a few days.  DIET: Your first meal following the procedure should be a light meal and then it is ok to progress to your normal diet.  A half-sandwich or bowl of soup is an example of a good first meal.  Heavy or fried foods are harder to digest and may make you feel nauseous or bloated.  Likewise meals heavy in dairy and vegetables can cause extra gas to form and this can also increase the bloating.  Drink plenty of fluids but you should avoid alcoholic beverages for 24 hours.  ACTIVITY: Your care partner should take you home directly after the procedure.  You should plan to take it easy, moving slowly for the rest of the day.  You can resume normal activity the day after the procedure however you should NOT DRIVE or use heavy machinery for 24 hours (because of the sedation medicines used during the test).    SYMPTOMS TO REPORT IMMEDIATELY: A gastroenterologist can be reached at any hour.  During normal business hours, 8:30 AM to 5:00 PM Monday through Friday,  call [phone].  After hours and on weekends, please call the GI answering service at [phone] who will take a message and have the physician on call contact you.   Following lower endoscopy (colonoscopy or flexible sigmoidoscopy):  Excessive amounts of blood in the stool  Significant tenderness or worsening of abdominal pains  Swelling of the abdomen that is new, acute  Fever of 100F or higher  Following upper endoscopy (EGD)  Vomiting of blood or coffee ground material  New chest pain or pain under the shoulder blades  Painful or persistently difficult swallowing  New shortness of breath  Fever of 100F or higher  Black, tarry-looking stools  FOLLOW UP: If any biopsies were taken you will be contacted by phone or by letter within the next 1-3 weeks.  Call your gastroenterologist if you have not heard about the biopsies in 3 weeks.  Our staff will call the home number listed on your records the next business day following your procedure to check on you and address any questions or concerns that you may have at that time regarding the information given to you following your procedure. This is a courtesy call and so if there is no answer at the home number and we have not heard from you through the emergency physician on call, we will assume that you have returned to your regular daily activities without incident.  SIGNATURES/CONFIDENTIALITY: You and/or your care   partner have signed paperwork which will be entered into your electronic medical record.  These signatures attest to the fact that that the information above on your After Visit Summary has been reviewed and is understood.  Full responsibility of the confidentiality of this discharge information lies with you and/or your care-partner.  

## 2012-05-08 ENCOUNTER — Telehealth: Payer: Self-pay | Admitting: *Deleted

## 2012-05-08 NOTE — Telephone Encounter (Signed)
  Follow up Call-  Call back number 05/05/2012  Post procedure Call Back phone  # [phone]  Permission to leave phone message Yes     No answer and no answering machine picked up to leave message

## 2012-06-08 ENCOUNTER — Other Ambulatory Visit: Payer: Managed Care, Other (non HMO) | Admitting: Internal Medicine

## 2012-06-08 DIAGNOSIS — E039 Hypothyroidism, unspecified: Secondary | ICD-10-CM

## 2012-11-07 ENCOUNTER — Other Ambulatory Visit: Payer: Self-pay

## 2012-11-07 DIAGNOSIS — E039 Hypothyroidism, unspecified: Secondary | ICD-10-CM

## 2012-11-07 MED ORDER — LEVOTHYROXINE SODIUM 100 MCG PO TABS: 100.0000 ug | ORAL_TABLET | Freq: Every day | ORAL | Status: DC

## 2012-11-08 ENCOUNTER — Other Ambulatory Visit: Payer: Self-pay

## 2012-11-08 DIAGNOSIS — E785 Hyperlipidemia, unspecified: Secondary | ICD-10-CM

## 2012-11-08 MED ORDER — SIMVASTATIN 10 MG PO TABS: 10.0000 mg | ORAL_TABLET | Freq: Every day | ORAL | Status: DC

## 2013-02-14 ENCOUNTER — Other Ambulatory Visit: Payer: Self-pay | Admitting: Internal Medicine

## 2013-02-14 LAB — CBC WITH DIFFERENTIAL/PLATELET
Eosinophils Relative: 0 % (ref 0–5)
HCT: 38.3 % (ref 36.0–46.0)
Lymphocytes Relative: 16 % (ref 12–46)
Lymphs Abs: 1.5 10*3/uL (ref 0.7–4.0)
MCH: 29.3 pg (ref 26.0–34.0)
MCV: 86.5 fL (ref 78.0–100.0)
Monocytes Absolute: 0.6 10*3/uL (ref 0.1–1.0)
Monocytes Relative: 7 % (ref 3–12)
RBC: 4.43 MIL/uL (ref 3.87–5.11)
WBC: 9.3 10*3/uL (ref 4.0–10.5)

## 2013-02-14 LAB — COMPREHENSIVE METABOLIC PANEL
ALT: 11 U/L (ref 0–35)
BUN: 12 mg/dL (ref 6–23)
CO2: 24 mEq/L (ref 19–32)
Calcium: 9.4 mg/dL (ref 8.4–10.5)
Chloride: 102 mEq/L (ref 96–112)
Creat: 0.83 mg/dL (ref 0.50–1.10)
Glucose, Bld: 89 mg/dL (ref 70–99)

## 2013-02-14 LAB — LIPID PANEL
Cholesterol: 179 mg/dL (ref 0–200)
Total CHOL/HDL Ratio: 2.3 Ratio
Triglycerides: 64 mg/dL (ref <150)

## 2013-02-15 ENCOUNTER — Other Ambulatory Visit: Payer: Managed Care, Other (non HMO) | Admitting: Internal Medicine

## 2013-02-15 LAB — VITAMIN D 25 HYDROXY (VIT D DEFICIENCY, FRACTURES): Vit D, 25-Hydroxy: 25 ng/mL — ABNORMAL LOW (ref 30–89)

## 2013-02-16 ENCOUNTER — Ambulatory Visit (INDEPENDENT_AMBULATORY_CARE_PROVIDER_SITE_OTHER): Payer: Managed Care, Other (non HMO) | Admitting: Internal Medicine

## 2013-02-16 ENCOUNTER — Encounter: Payer: Self-pay | Admitting: Internal Medicine

## 2013-02-16 VITALS — BP 114/76 | HR 72 | Temp 98.8°F | Ht 62.0 in | Wt 135.5 lb

## 2013-02-16 DIAGNOSIS — K5732 Diverticulitis of large intestine without perforation or abscess without bleeding: Secondary | ICD-10-CM

## 2013-02-16 DIAGNOSIS — G4700 Insomnia, unspecified: Secondary | ICD-10-CM

## 2013-02-16 DIAGNOSIS — Z0000 Encounter for general adult medical examination without abnormal findings: Secondary | ICD-10-CM

## 2013-02-16 DIAGNOSIS — N6019 Diffuse cystic mastopathy of unspecified breast: Secondary | ICD-10-CM

## 2013-02-16 DIAGNOSIS — Z23 Encounter for immunization: Secondary | ICD-10-CM

## 2013-02-16 DIAGNOSIS — E785 Hyperlipidemia, unspecified: Secondary | ICD-10-CM

## 2013-02-16 DIAGNOSIS — E039 Hypothyroidism, unspecified: Secondary | ICD-10-CM

## 2013-02-16 LAB — POCT URINALYSIS DIPSTICK
Bilirubin, UA: NEGATIVE
Blood, UA: NEGATIVE
Glucose, UA: NEGATIVE
Leukocytes, UA: NEGATIVE
Nitrite, UA: NEGATIVE

## 2013-02-16 MED ORDER — PNEUMOCOCCAL VAC POLYVALENT 25 MCG/0.5ML IJ INJ: 0.5000 mL | INJECTION | INTRAMUSCULAR | Status: AC

## 2013-04-02 ENCOUNTER — Other Ambulatory Visit: Payer: Self-pay

## 2013-04-02 DIAGNOSIS — E039 Hypothyroidism, unspecified: Secondary | ICD-10-CM

## 2013-04-02 DIAGNOSIS — E785 Hyperlipidemia, unspecified: Secondary | ICD-10-CM

## 2013-04-02 MED ORDER — LEVOTHYROXINE SODIUM 100 MCG PO TABS: 100.0000 ug | ORAL_TABLET | Freq: Every day | ORAL | Status: DC

## 2013-04-02 MED ORDER — SIMVASTATIN 10 MG PO TABS: 10.0000 mg | ORAL_TABLET | Freq: Every day | ORAL | Status: DC

## 2013-04-02 MED ORDER — ZOLPIDEM TARTRATE 5 MG PO TABS: 5.0000 mg | ORAL_TABLET | Freq: Every evening | ORAL | Status: DC | PRN

## 2013-05-17 ENCOUNTER — Other Ambulatory Visit: Payer: Self-pay

## 2013-05-17 DIAGNOSIS — Z1231 Encounter for screening mammogram for malignant neoplasm of breast: Secondary | ICD-10-CM

## 2013-06-02 ENCOUNTER — Encounter: Payer: Self-pay | Admitting: Internal Medicine

## 2013-06-02 ENCOUNTER — Telehealth: Payer: Self-pay | Admitting: Internal Medicine

## 2013-06-02 NOTE — Telephone Encounter (Signed)
Called this morning complaining of recurrent diverticulitis symptoms for 2 days. Does not have refill on Cipro and Flagyl. Called to [facility] refills on Cipro and Flagyl as previously prescribed. Patient worried that she might need to have colon resection for recurrent diverticulitis. Says she recently had colonoscopy with Dr. Juanda Chance. Will as Schedule appointment with Dr. Juanda Chance to discuss recurrent diverticulitis.

## 2013-06-02 NOTE — Patient Instructions (Addendum)
Take Cipro and Flagyl for recurrent diverticulitis. Return in 6 months

## 2013-06-02 NOTE — Progress Notes (Signed)
Subjective:    Patient ID: Pamela [name], female    DOB: 12/06/1945, 67 y.o.   MRN: [id number]  HPI 67 year old white female with history of hyperlipidemia, osteopenia, fibrocystic breast disease, diverticulitis of the colon which is recurrent from time to time, hypothyroidism and insomnia in today for health maintenance exam and evaluation of medical problems. She is on statin medication. Is also on thyroid replacement. When she gets bouts of diverticulitis we treat her with Cipro and Flagyl. She's had recurrent episodes for a number of years.   History of  Positive ANA with a titer of 1:160 with no evidence of SLE seen by Dr. Phylliss Bob in May 07, 1995 and was diagnosed with fibromyalgia-type complex.  Tetanus immunization fall May 06, 2010, Zostavax vaccine January 2012. Colonoscopy by Dr. Juanda Chance June 2013.  Diagnosed with hypothyroidism in May 06, 1992. TSH was 33.3 when she was diagnosed.  Past medical history: History of left wrist fracture 1987 had to be repaired surgically, has had recurrent urinary tract infections on occasion. Proven sigmoid diverticulitis March 2001 by CT.  Social history: This is her second marriage. She is retired and enjoys gardening. 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 No history of smoking. Occasional wine consumption.  Family history: Father died in 05-06-2000 with history of coronary artery disease, CVA, MI diabetes. Mother died at age 36 15-Sep-1994 with history of hypertension and lung cancer. 3 brothers and one sister in good health. No children.  Last bone density study July 2012 showed a T score in the LS spine of -1.7 and in the femoral neck of -2.1. Patient does not want to be on bone sparing medication. Takes calcium and vitamin D.  History of ectopic pregnancies in 1982, 1983 and 05/06/84 resulting in laparotomy each time.  Patient had tonsillectomy around 1950 03/17/1954.  In 07-May-1983 she had tubal repair laparotomy.  Hospitalized for chest pain in August 2003. MI was  ruled out. 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 Had negative cardiac stress test 07/03/2002 and has had no further recurrence.    Review of Systems  HENT: Negative.   Eyes: Negative.   Respiratory: Negative.   Cardiovascular: Negative.   Gastrointestinal:       Recurrent diverticulitis. Worse in spring when she's eating fresh vegetables  Endocrine:       History of hypothyroidism  Allergic/Immunologic: Negative.   Neurological: Negative.   Psychiatric/Behavioral:       History of insomnia       Objective:   Physical Exam  Vitals reviewed. Constitutional: She is oriented to person, place, and time. She appears well-developed and well-nourished. No distress.  HENT:  Head: Normocephalic and atraumatic.  Right Ear: External ear normal.  Left Ear: External ear normal.  Mouth/Throat: Oropharynx is clear and moist. No oropharyngeal exudate.  Eyes: Conjunctivae and EOM are normal. Pupils are equal, round, and reactive to light. Right eye exhibits no discharge. Left eye exhibits no discharge. No scleral icterus.  Neck: Neck supple. No JVD present. No thyromegaly present.  Cardiovascular: Normal rate, regular rhythm, normal heart sounds and intact distal pulses.   No murmur heard. Pulmonary/Chest: Effort normal and breath sounds normal. No respiratory distress. She has no wheezes. She has no rales. She exhibits no tenderness.  Abdominal: Soft. Bowel sounds are normal. She exhibits no distension and no mass. There is no tenderness. There is no rebound and no guarding.  Musculoskeletal: Normal range of motion. She exhibits no edema.  Neurological: She is alert and oriented  to person, place, and time. She has normal reflexes. She displays normal reflexes. No cranial nerve deficit. Coordination normal.  Skin: Skin is warm and dry. No rash noted. She is not diaphoretic.  Psychiatric: She has a normal mood and affect. Her behavior is normal. Judgment and thought content normal.   Pap deferred.  Bimanual normal.       Assessment & Plan:  Recurrent diverticulitis. Prescribe Cipro and Flagyl for 7 days. Had colonoscopy 2013.  Hypothyroidism-stable on thyroid replacement therapy  Insomnia  History of fibrocystic breast disease-recommend annual mammogram  Plan: Return in 6 months for office visit lipid panel liver functions and TSH. Make sure she has Cipro and Flagyl on hand for recurrent attacks of diverticulitis. This may happen 1-2 times a year.

## 2013-06-04 ENCOUNTER — Telehealth: Payer: Self-pay

## 2013-06-04 DIAGNOSIS — K5732 Diverticulitis of large intestine without perforation or abscess without bleeding: Secondary | ICD-10-CM

## 2013-06-06 ENCOUNTER — Encounter: Payer: Self-pay | Admitting: Internal Medicine

## 2013-06-06 NOTE — Telephone Encounter (Signed)
Patient scheduled for an appointment with Dr. Juanda Chance on 08/03/2013 at 245pm. Informed of this.

## 2013-06-15 ENCOUNTER — Encounter: Payer: Self-pay | Admitting: *Deleted

## 2013-06-19 ENCOUNTER — Ambulatory Visit
Admission: RE | Admit: 2013-06-19 | Discharge: 2013-06-19 | Disposition: A | Discharge status: Home / Self Care | Payer: Managed Care, Other (non HMO) | Source: Ambulatory Visit

## 2013-06-19 DIAGNOSIS — Z1231 Encounter for screening mammogram for malignant neoplasm of breast: Secondary | ICD-10-CM

## 2013-06-25 ENCOUNTER — Other Ambulatory Visit: Payer: Self-pay | Admitting: Internal Medicine

## 2013-07-14 ENCOUNTER — Other Ambulatory Visit: Payer: Self-pay | Admitting: Internal Medicine

## 2013-07-15 ENCOUNTER — Telehealth: Payer: Self-pay | Admitting: Internal Medicine

## 2013-07-15 DIAGNOSIS — K5732 Diverticulitis of large intestine without perforation or abscess without bleeding: Secondary | ICD-10-CM

## 2013-07-15 NOTE — Telephone Encounter (Signed)
These were refilled urgently by phone today

## 2013-07-15 NOTE — Telephone Encounter (Signed)
Pt called this afternoon with 2 day history of recurrent diverticulitis symptoms. Last had bout lte July treated urgently over the weekend with Cipro and Flagyl. Now has similar symptoms which had onset Friday night. No fever or chills. No N and V. Has appt to see Dr. Juanda Chance in early Sept.  Have refilled Cipro and Flagyl today by phone. Advise pt to have CBC with Diff CT abd and pelvis with contrast this coming week.

## 2013-07-16 ENCOUNTER — Telehealth: Payer: Self-pay

## 2013-07-16 DIAGNOSIS — K5792 Diverticulitis of intestine, part unspecified, without perforation or abscess without bleeding: Secondary | ICD-10-CM

## 2013-07-16 NOTE — Telephone Encounter (Signed)
Left messages on cell and home phone this am

## 2013-07-16 NOTE — Telephone Encounter (Signed)
Patient is scheduled for a CT abdomen and pelvis w/contrast on 07/18/2013. Will come in for labs prior to that.

## 2013-07-17 ENCOUNTER — Other Ambulatory Visit: Payer: Managed Care, Other (non HMO) | Admitting: Internal Medicine

## 2013-07-17 DIAGNOSIS — K5732 Diverticulitis of large intestine without perforation or abscess without bleeding: Secondary | ICD-10-CM

## 2013-07-17 DIAGNOSIS — Z0189 Encounter for other specified special examinations: Secondary | ICD-10-CM

## 2013-07-17 LAB — BASIC METABOLIC PANEL
BUN: 11 mg/dL (ref 6–23)
CO2: 26 mEq/L (ref 19–32)
Chloride: 100 mEq/L (ref 96–112)
Creat: 0.76 mg/dL (ref 0.50–1.10)
Potassium: 4.1 mEq/L (ref 3.5–5.3)

## 2013-07-17 LAB — CBC WITH DIFFERENTIAL/PLATELET
Basophils Absolute: 0 10*3/uL (ref 0.0–0.1)
Eosinophils Relative: 0 % (ref 0–5)
HCT: 38.2 % (ref 36.0–46.0)
Hemoglobin: 13.2 g/dL (ref 12.0–15.0)
Lymphocytes Relative: 24 % (ref 12–46)
Lymphs Abs: 1.2 10*3/uL (ref 0.7–4.0)
MCV: 85.1 fL (ref 78.0–100.0)
Monocytes Absolute: 0.4 10*3/uL (ref 0.1–1.0)
Monocytes Relative: 8 % (ref 3–12)
Neutro Abs: 3.4 10*3/uL (ref 1.7–7.7)
RBC: 4.49 MIL/uL (ref 3.87–5.11)
WBC: 5.1 10*3/uL (ref 4.0–10.5)

## 2013-07-18 ENCOUNTER — Ambulatory Visit
Admission: RE | Admit: 2013-07-18 | Discharge: 2013-07-18 | Disposition: A | Discharge status: Home / Self Care | Payer: Managed Care, Other (non HMO) | Source: Ambulatory Visit | Attending: Internal Medicine | Admitting: Internal Medicine

## 2013-07-18 MED ORDER — IOHEXOL 300 MG/ML  SOLN
100.0000 mL | Freq: Once | INTRAMUSCULAR | Status: AC | PRN
  Administered 2013-07-18: 100 mL via INTRAVENOUS

## 2013-07-19 NOTE — Telephone Encounter (Signed)
Patient informed. Will see Dr. Diamond Nickel on 08/03/2013, as they cannot get her in sooner.

## 2013-08-03 ENCOUNTER — Ambulatory Visit (INDEPENDENT_AMBULATORY_CARE_PROVIDER_SITE_OTHER): Payer: Managed Care, Other (non HMO) | Admitting: Internal Medicine

## 2013-08-03 ENCOUNTER — Encounter: Payer: Self-pay | Admitting: Internal Medicine

## 2013-08-03 VITALS — BP 126/68 | HR 72 | Ht 61.75 in | Wt 126.5 lb

## 2013-08-03 DIAGNOSIS — K5732 Diverticulitis of large intestine without perforation or abscess without bleeding: Secondary | ICD-10-CM

## 2013-08-03 NOTE — Patient Instructions (Addendum)
Barium Enema Prep  You have been scheduled for a Barium Enema.  Your procedure time is at 10:00 am on Tuesday 08/07/13. You should register in the radiology department at [facility].  You will need to arrive 15 minutes prior to your scheduled appointment time.  Be prepared to spend 1 to 2  hours in the Radiology Department. You will need to purchase a bottle of Magnesium Citrate, Dulcolax (5 mg) tablets and Dulcolax suppository from the laxative section of your drug store.    It is very important to follow all the below instructions.  Failure to follow these instructions may result in a study that is less than optimal and may lead to the necessity of repeating the examination.    On the day before your examination: 08/06/13 Monday  12:00 Lunch- This meal may include clear broth, white chicken meat sandwich, (no butter, lettuce or other additives), or two hard boiled eggs, strained fruit juice, jello (not containing nuts of fruit).  Coffee,  tea (no milk or cream), water or carbonated beverages.    1:00 Drink one full glass or more of water  3:00 Drink one full glass or more of water  5:00 Have a clear liquid supper.  Clear liquids include: water, ice, tea/coffee (sugar is ok, but no milk or cream), juice (apple, white grape, white cranberry), clear bullion, consomme, broth, strained chicken noodle soup, jello, popsicles, powdered fruit flavored drinks, gatorade, lemonade, carbonated beverages, hard candy.  7:00 Drink one full or more glass of water  8:00  Drink a bottle of Magnesium Citrate  10:00 Take 3 DULCOLAX tablets with one full glass or more of water  On the Day of your examination:08/07/13 Tuesday  No breakfast except coffee or tea (without milk or cream) clear strained fruit juice  7:00 am Drink one full glass of water.  Insert the DULCOLAX Suppository into the rectum.   ____________________________________________________________________________________________________________________  You have been scheduled for an appointment with Dr Michaell Cowing at [facility]. Your appointment is on Tuesday 08/14/13 at 10:15 am. Please arrive at 9:45 am for registration. Make certain to bring a list of current medications, including any over the counter medications or vitamins. Also bring your co-pay if you have one as well as your insurance cards. [facility] is located at [address]. Should you need to reschedule your appointment, please contact them at [phone].  CC: Dr  Lenord Fellers, Dr Karie Soda

## 2013-08-03 NOTE — Progress Notes (Signed)
Pamela [name] 18-Sep-1946 MRN [id number]  History of Present Illness:  This is a 67 year old white female with recurrent attacks of diverticulitis. She has  moderately severe diverticulosis based on previous colonoscopies in 2002, 2004 and most recently in June 2013. She has had numerous episodes of left lower quadrant abdominal pain; most recently at least 3 separate attacks this summer documented on a CT scan of the abdomen on 07/16/2013 showing thickening and proximal sigmoid colon consistent with diverticulitis. There was no extravasation and no abscess. She is currently in remission. She is on a low-residue diet and denies any diarrhea, abdominal pain or rectal bleeding. There has been no fever or weight loss.Her husband just recovered from perforated diverticulitis requiring temporary colostomy and she would like to be considered for sigmoid resection in order to prevent similar complication   Past Medical History  Diagnosis Date  . Diverticulosis   . Hypertension   . Osteopenia   . Diverticulitis   . Hyperlipidemia   . Vaginal atrophy   . Vitamin D deficiency   . Fibrocystic breast disease   . Hypothyroidism   . Tubal ectopic pregnancy     x 3  . Colon polyps    Past Surgical History  Procedure Laterality Date  . Wrist fracture surgery Left 1987  . Laparotomy      x 4  . Laparoscopy      x 4 for invitro  . Tubal repair Right     reports that she has never smoked. She has never used smokeless tobacco. She reports that  drinks alcohol. She reports that she does not use illicit drugs. family history includes Bladder Cancer in her paternal uncle; Colon cancer in her paternal uncle; Diabetes in her father and other; Heart disease in her father; Hypertension in her mother; Prostate cancer in her paternal uncle; Skin cancer in her paternal uncle; Stroke in her father. Allergies  Allergen Reactions  . Boniva [Ibandronate Sodium]     GI  . Morphine And Related Nausea And Vomiting   . Penicillins Hives  . Succinylcholine         Review of Systems: Denies dysphagia heartburn chest pain  The remainder of the 10 point ROS is negative except as outlined in H&P   Physical Exam: General appearance  Well developed, in no distress. Eyes- non icteric. HEENT nontraumatic, normocephalic. Mouth no lesions, tongue papillated, no cheilosis. Neck supple without adenopathy, thyroid not enlarged, no carotid bruits, no JVD. Lungs Clear to auscultation bilaterally. Cor normal S1, normal S2, regular rhythm, no murmur,  quiet precordium. Abdomen: Soft with minimal tenderness in left lower quadrant. No fullness. Normal active bowel sounds. No CVA tenderness. Rectal: Soft Hemoccult negative stool. Extremities no pedal edema. Skin no lesions. Neurological alert and oriented x 3. Psychological normal mood and affect.  Assessment and Plan:  Problem #76 67 year old white female with numerous episodes of left lower quadrant abdominal pain, some of them documented as acute diverticulitis. She has had 3 recent episodes requiring 3 separate courses of antibiotics. She has documented moderately severe diverticulosis of the sigmoid colon from last years colonoscopy. She is ready to get a surgical consultation for segmental resection of the sigmoid colon. Her husband just recovered from an episode of acute diverticulitis with perforation. He had a diverting colostomy and subsequent  takedown . She would like to prevent this complication herself. We will obtain a barium enema to assess the extent and the severity of the disease and will set up a  surgical consultation. She will remain on a low-residue diet.   08/03/2013 Pamela [name]

## 2013-08-05 ENCOUNTER — Encounter: Payer: Self-pay | Admitting: Internal Medicine

## 2013-08-07 ENCOUNTER — Ambulatory Visit (HOSPITAL_COMMUNITY)
Admission: RE | Admit: 2013-08-07 | Discharge: 2013-08-07 | Disposition: A | Discharge status: Home / Self Care | Payer: Managed Care, Other (non HMO) | Source: Ambulatory Visit | Attending: Internal Medicine | Admitting: Internal Medicine

## 2013-08-07 DIAGNOSIS — K5732 Diverticulitis of large intestine without perforation or abscess without bleeding: Secondary | ICD-10-CM

## 2013-08-07 DIAGNOSIS — K5730 Diverticulosis of large intestine without perforation or abscess without bleeding: Secondary | ICD-10-CM | POA: Insufficient documentation

## 2013-08-14 ENCOUNTER — Telehealth: Payer: Self-pay

## 2013-08-14 ENCOUNTER — Ambulatory Visit (INDEPENDENT_AMBULATORY_CARE_PROVIDER_SITE_OTHER): Payer: Managed Care, Other (non HMO) | Admitting: Surgery

## 2013-08-14 ENCOUNTER — Encounter (INDEPENDENT_AMBULATORY_CARE_PROVIDER_SITE_OTHER): Payer: Self-pay | Admitting: Surgery

## 2013-08-14 VITALS — BP 136/90 | HR 58 | Temp 96.6°F | Ht 62.0 in | Wt 126.8 lb

## 2013-08-14 DIAGNOSIS — K5732 Diverticulitis of large intestine without perforation or abscess without bleeding: Secondary | ICD-10-CM

## 2013-08-14 MED ORDER — METRONIDAZOLE 500 MG PO TABS: 500.0000 mg | ORAL_TABLET | ORAL | Status: DC

## 2013-08-14 MED ORDER — NEOMYCIN SULFATE 500 MG PO TABS: 1000.0000 mg | ORAL_TABLET | ORAL | Status: DC

## 2013-08-14 NOTE — Telephone Encounter (Signed)
Patient has been seeing Drs. Juanda Chance and Gross, and is tentatively scheduled for a colon resection on Oct7th. However they want to do a robotic assisted surgery with 2 other surgeons present. She wants to talk to you about this; she has a 6 month followup appt with you next week. Not sure why the urgency.

## 2013-08-14 NOTE — Progress Notes (Signed)
Subjective:     Patient ID: Pamela [name], female   DOB: 03-30-1946, 67 y.o.   MRN: [id number]  HPI  BRITIANY [name]  [birth year] [id number]  Patient Care Team: [name], MD as PCP - General (Internal Medicine) [name], MD as Consulting Physician (Gastroenterology) [name], MD as Consulting Physician (General Surgery)  This patient is a 67 y.o.female who presents today for surgical evaluation at the request of Dr. Juanda Chance.   Reason for visit: Recurrent sigmoid diverticulitis  Pleasant active woman.  She has had attacks of diverticulitis for over a decade.  Treated with oral antibiotics.  She has got them virtually every year.  She is proud prior colonoscopies.  Descending and sigmoid diverticulosis noted.  Some chronic inflammation and mild stricturing noted on CAT scan and endoscopy, especially with the most recent evaluation past two years.  She had another bout of diverticulitis this summer.  Required three courses of antibiotics to improve it.  Also, her husband had perforated diverticulitis that required an emergency Hartmann procedure with colostomy.  That concerned her.  She comes in today to consider surgery to resect the problem area.  She normally has about one every day.  She sticks to a high-fiber diet with a fiber supplement.  She has had mild hemorrhoid flares in the past but nothing recently.  No history of skin problems.  She did have a ectopic pregnancy that required surgery.  She had a tubal ligation.  It was reversed in [facility].  She is also had attempts at in vitro fertilization.  She got another ectopic pregnancy.  No history of bowel obstructions or fistulas.  Patient Active Problem List   Diagnosis Date Noted  . Diverticulitis of sigmoid colon - recurrent 08/14/2013  . Hypothyroidism 06/26/2011  . Osteopenia 06/26/2011  . Fibrocystic breast disease 06/26/2011  . Hyperlipidemia 06/26/2011  . Insomnia 06/26/2011    Past Medical History    Diagnosis Date  . Diverticulosis   . Hypertension   . Osteopenia   . Diverticulitis   . Hyperlipidemia   . Vaginal atrophy   . Vitamin D deficiency   . Fibrocystic breast disease   . Hypothyroidism   . Tubal ectopic pregnancy     x 3  . Colon polyps     Past Surgical History  Procedure Laterality Date  . Wrist fracture surgery Left 1987  . Tubal ligation  1980s  . Ectopic pregnancy surgery  1980s    x2  . Reversal tubal ligation  1980s  . Fertility surgery      x4 IVF    History   Social History  . Marital Status: Married    Spouse Name: N/A    Number of Children: 0  . Years of Education: N/A   Occupational History  . accountant    Social History Main Topics  . Smoking status: Never Smoker   . Smokeless tobacco: Never Used  . Alcohol Use: Yes     Comment: 1 per day  . Drug Use: No  . Sexual Activity: Not on file   Other Topics Concern  . Not on file   Social History Narrative  . No narrative on file    Family History  Problem Relation Age of Onset  . Colon cancer Paternal Uncle   . Cancer Paternal Uncle     Colon  . Bladder Cancer Paternal Uncle   . Cancer Paternal Chief Financial Officer  . Prostate cancer  Paternal Uncle   . Skin cancer Paternal Uncle   . Diabetes Father   . Heart disease Father   . Stroke Father   . Diabetes Other     father's side juvenile and adult onset  . Hypertension Mother   . Cancer Mother     Lung  . Cancer Paternal Grandfather     Skin/Stomach    Current Outpatient Prescriptions  Medication Sig Dispense Refill  . levothyroxine (SYNTHROID, LEVOTHROID) 100 MCG tablet Take 1 tablet (100 mcg total) by mouth daily.  90 tablet  1  . psyllium (METAMUCIL) 58.6 % packet Take 1 packet by mouth daily.        . simvastatin (ZOCOR) 10 MG tablet Take 1 tablet (10 mg total) by mouth at bedtime.  90 tablet  1  . zolpidem (AMBIEN) 5 MG tablet Take 1 tablet (5 mg total) by mouth at bedtime as needed.  30 tablet  1  .  metroNIDAZOLE (FLAGYL) 500 MG tablet Take 1 tablet (500 mg total) by mouth as directed. Take 2 pills (=1000mg ) by mouth at 1pm, 3pm, and 10pm the day before your colorectal operation as discussed in [facility] office.  Call [phone] with questions  6 tablet  0  . neomycin (MYCIFRADIN) 500 MG tablet Take 2 tablets (1,000 mg total) by mouth as directed. Take 2 pills (=1000mg ) by mouth at 1pm, 3pm, and 10pm the day before your colorectal operation as discussed in [facility] office.  Call [phone] with questions  6 tablet  0   No current facility-administered medications for this visit.     Allergies  Allergen Reactions  . Boniva [Ibandronate Sodium]     GI  . Morphine And Related Nausea And Vomiting  . Penicillins Hives  . Succinylcholine     BP 136/90  Pulse 58  Temp(Src) 96.6 F (35.9 C) (Temporal)  Ht 5\' 2"  (1.575 m)  Wt 126 lb 12.8 oz (57.516 kg)  BMI 23.19 kg/m2  SpO2 98%  Ct Abdomen Pelvis W Contrast  07/18/2013   *RADIOLOGY REPORT*  Clinical Data: Left sided abdominal pain. Diverticulitis.  CT ABDOMEN AND PELVIS WITH CONTRAST  Technique:  Multidetector CT imaging of the abdomen and pelvis was performed following the standard protocol during bolus administration of intravenous contrast.  Contrast: OMNIPAQUE IOHEXOL 300 MG/ML  SOLN  Comparison: None.  Findings: A tiny sub-centimeter cyst is noted the left hepatic lobe and other tiny sub-centimeter cysts are noted within the kidneys. No masses are seen involving the liver, kidneys, pancreas, adrenal glands, or spleen.  No evidence of hydronephrosis.  Uterus and adnexal regions are unremarkable.  Diverticulosis is seen as well as mild wall thickening involving the proximal sigmoid colon.  This is consistent with mild diverticulitis.  There is no evidence of abscess or free fluid. No evidence of bowel obstruction.  IMPRESSION:  Mild sigmoid diverticulitis.  No evidence of abscess or other complication.   Original Report Authenticated By:  Myles Rosenthal, M.D.   Dg Colon Ramond Craver Hi Den Cm  08/09/2013   CLINICAL DATA:  History of diverticulitis. Possible candidate for colonic resection.  EXAM: AIR CONTRAST BARIUM ENEMA  TECHNIQUE: Initial scout AP supine abdominal image obtained to insure adequate colon cleansing. Barium was introduced into the colon in a retrograde fashion and refluxed from the rectum to the distal transverse colon. As much of the barium as possible was then removed through the indwelling tube via gravity drain. Air was then insufflated into the colon. Spot  images of the colon followed by overhead radiographs were obtained.  COMPARISON:  No priors.  FLUOROSCOPY TIME:  7 min and 30 seconds.  FINDINGS: Extensive diverticulosis is noted, predominantly in the distal descending colon and sigmoid colon, where there appears to be some mild narrowing of the colonic lumen, presumably related to chronic inflammation and mild scarring. No definite colonic polyps or colonic mass identified on today's examination.  IMPRESSION: Extensive colonic diverticulosis predominantly involving the descending colon and sigmoid colon.   Electronically Signed   By: Trudie Reed M.D.   On: 08/09/2013 10:35     Review of Systems  Constitutional: Negative for fever, chills, diaphoresis, appetite change and fatigue.  HENT: Negative for ear pain, sore throat, trouble swallowing, neck pain and ear discharge.   Eyes: Negative for photophobia, discharge and visual disturbance.  Respiratory: Negative for cough, choking, chest tightness and shortness of breath.   Cardiovascular: Negative for chest pain and palpitations.        No exertional chest/neck/shoulder/arm pain.  Patient can walk 5 miles without difficulty.    Gastrointestinal: Positive for abdominal pain and abdominal distention. Negative for nausea, vomiting, diarrhea, constipation, blood in stool, anal bleeding and rectal pain.       No personal nor family history of GI/colon cancer, inflammatory  bowel disease, irritable bowel syndrome, allergy such as Celiac Sprue, dietary/dairy problems, colitis, ulcers nor gastritis.  No recent sick contacts/gastroenteritis.  No travel outside the country.  No changes in diet.    Endocrine: Negative for cold intolerance and heat intolerance.  Genitourinary: Negative for dysuria, frequency and difficulty urinating.  Musculoskeletal: Negative for myalgias and gait problem.  Skin: Negative for color change, pallor and rash.  Allergic/Immunologic: Negative for environmental allergies, food allergies and immunocompromised state.  Neurological: Negative for dizziness, speech difficulty, weakness and numbness.  Hematological: Negative for adenopathy.  Psychiatric/Behavioral: Negative for confusion and agitation. The patient is not nervous/anxious.        Objective:   Physical Exam  Constitutional: She is oriented to person, place, and time. She appears well-developed and well-nourished. No distress.  HENT:  Head: Normocephalic.  Mouth/Throat: Oropharynx is clear and moist. No oropharyngeal exudate.  Eyes: Conjunctivae and EOM are normal. Pupils are equal, round, and reactive to light. No scleral icterus.  Neck: Normal range of motion. Neck supple. No tracheal deviation present.  Cardiovascular: Normal rate, regular rhythm and intact distal pulses.   Pulmonary/Chest: Effort normal and breath sounds normal. No stridor. No respiratory distress. She exhibits no tenderness.  Abdominal: Soft. She exhibits no distension and no mass. There is no tenderness. There is no rigidity, no rebound, no guarding, no CVA tenderness, no tenderness at McBurney's point and negative Murphy's sign. No hernia. Hernia confirmed negative in the right inguinal area and confirmed negative in the left inguinal area.    Genitourinary: No vaginal discharge found.  Musculoskeletal: Normal range of motion. She exhibits no tenderness.       Right elbow: She exhibits normal range of  motion.       Left elbow: She exhibits normal range of motion.       Right wrist: She exhibits normal range of motion.       Left wrist: She exhibits normal range of motion.       Right hand: Normal strength noted.       Left hand: Normal strength noted.  Lymphadenopathy:       Head (right side): No posterior auricular adenopathy present.  Head (left side): No posterior auricular adenopathy present.    She has no cervical adenopathy.    She has no axillary adenopathy.       Right: No inguinal adenopathy present.       Left: No inguinal adenopathy present.  Neurological: She is alert and oriented to person, place, and time. No cranial nerve deficit. She exhibits normal muscle tone. Coordination normal.  Skin: Skin is warm and dry. No rash noted. She is not diaphoretic. No erythema.  Psychiatric: She has a normal mood and affect. Her behavior is normal. Judgment and thought content normal.       Assessment:     Recurrent bouts of diverticulitis with at least 15 attacks, three this summer.  Confirmed by enema, colonoscopy, CT scan.     Plan:     I think she is past the point of requiring surgery.  I think she would benefit from it.  She agrees.  Despite her numerous pelvic surgeries, she is a reasonable candidate to consider laparoscopic or robotic approach.  I discussed with her how we are starting in robotic colorectal program.  She is interested in being one of the first cases.  I explained that we are having a proctor and extra training in the process.  We will see if that can be arranged in the next few weeks:  The anatomy & physiology of the digestive tract was discussed.  The pathophysiology was discussed.  Natural history risks without surgery was discussed.   I feel the risks of no intervention will lead to serious problems that outweigh the operative risks; therefore, I recommended a partial colectomy to remove the pathology.  Laparoscopic, robotic & open techniques were  discussed.   Risks such as bleeding, infection, abscess, leak, reoperation, possible ostomy, hernia, heart attack, death, and other risks were discussed.  I noted a good likelihood this will help address the problem.   Goals of post-operative recovery were discussed as well.  We will work to minimize complications.  An educational handout on the pathology was given as well.  Questions were answered.  The patient expresses understanding & wishes to proceed with surgery.

## 2013-08-14 NOTE — Patient Instructions (Addendum)
See the Handout(s) we gave you.  Consider surgery.  Please call our office at [phone] if you wish to schedule surgery or if you have further questions / concerns.     da Vinci Surgery If you have been diagnosed a colorectal condition, including: colon cancer, rectal cancer, diverticulitis, and inflammatory bowel disease (ulcerative colitis and Crohn's disease), your doctor may recommend surgery. Surgery to remove all or part of the colon is known as a colectomy. Rectal cancer surgery is known as a low anterior resection.  If you are facing colorectal surgery, ask your doctor about minimally invasive da Vinci Surgery.  Why da Vinci Surgery?Instead of a large abdominal incision used in open surgery, Youth worker make a few small incisions - similar to traditional laparoscopy. The [employer] System features a magnified 3D high-definition vision system and special wristed instruments that bend and rotate far greater than the human wrist. As a result, Archivist to operate with enhanced vision, precision, dexterity and control.  Engineer, building services Colectomy - As a result of Dispensing optician, Engineer, building services Colectomy offers the following potential benefits:  Precise removal of cancerous tissue1 Low blood loss1,2 Quick return of bowel function2,3 Quick return to a normal diet2,3 Low rate of complications1,2,3 Low conversion rate to open surgery2,3 Short hospital stay1,2,3 Better cosmetic result compared to open surgery da Vinci Low Anterior Resection - As a result of Dispensing optician, Engineer, building services Low Anterior Resection offers precise removal of cancerous tissue as well as the following potential benefits when compared to open surgery:  Less blood loss5 Less pain6 Shorter hospital stay6 Quicker return of bowel function6 Quicker return to a normal diet6 Faster recovery6 Better cosmetic result When compared to traditional laparoscopy, da Vinci Low Anterior Resection offers the  following potential benefits:  Lower conversion rate to open surgery7 Fewer major complications7 Shorter hospital stay7 Quicker return to a normal diet7 Quicker return of urinary function8 Quicker return of sexual Dietitian uses the latest in surgical and robotics technologies and is beneficial for performing complex surgery. Your surgeon is 100% in control of the [employer], which translates his or her hand movements into smaller, more precise movements of tiny instruments inside your body. Engineer, building services - taking surgery beyond the limits of the human hand.  Physicians have used the [employer] successfully worldwide in approximately 1.5 million various surgical procedures to date. Engineer, building services is changing the experience of surgery for people around the world.  Risks & Considerations of da Vinci Colectomy & Low Anterior Resection:Potential risks of any colectomy or low anterior resection procedure include:1,6  Anastomotic leak (intestinal fluid leak) Ileus (bowel blockage) Pulmonary embolism (blocked lung artery) Abscess Urinary problems  ABDOMINAL SURGERY: POST OP INSTRUCTIONS  1. DIET: Follow a light bland diet the first 24 hours after arrival home, such as soup, liquids, crackers, etc.  Be sure to include lots of fluids daily.  Avoid fast food or heavy meals as your are more likely to get nauseated.  Eat a low fat the next few days after surgery.   2. Take your usually prescribed home medications unless otherwise directed. 3. PAIN CONTROL: a. Pain is best controlled by a usual combination of three different methods TOGETHER: i. Ice/Heat ii. Over the counter pain medication iii. Prescription pain medication b. Most patients will experience some swelling and bruising around the incisions.  Ice packs or heating pads (30-60 minutes up to 6 times a day)  will help. Use ice for the first few days to help decrease swelling and bruising, then switch to heat to help  relax tight/sore spots and speed recovery.  Some people prefer to use ice alone, heat alone, alternating between ice & heat.  Experiment to what works for you.  Swelling and bruising can take several weeks to resolve.   c. It is helpful to take an over-the-counter pain medication regularly for the first few weeks.  Choose one of the following that works best for you: i. Naproxen (Aleve, etc)  Two 220mg  tabs twice a day ii. Ibuprofen (Advil, etc) Three 200mg  tabs four times a day (every meal & bedtime) iii. Acetaminophen (Tylenol, etc) 500-650mg  four times a day (every meal & bedtime) d. A  prescription for pain medication (such as oxycodone, hydrocodone, etc) should be given to you upon discharge.  Take your pain medication as prescribed.  i. If you are having problems/concerns with the prescription medicine (does not control pain, nausea, vomiting, rash, itching, etc), please call us [phone] to see if we need to switch you to a different pain medicine that will work better for you and/or control your side effect better. ii. If you need a refill on your pain medication, please contact your pharmacy.  They will contact our office to request authorization. Prescriptions will not be filled after 5 pm or on week-ends. 4. Avoid getting constipated.  Between the surgery and the pain medications, it is common to experience some constipation.  Increasing fluid intake and taking a fiber supplement (such as Metamucil, Citrucel, FiberCon, MiraLax, etc) 1-2 times a day regularly will usually help prevent this problem from occurring.  A mild laxative (prune juice, Milk of Magnesia, MiraLax, etc) should be taken according to package directions if there are no bowel movements after 48 hours.   5. Watch out for diarrhea.  If you have many loose bowel movements, simplify your diet to bland foods & liquids for a few days.  Stop any stool softeners and decrease your fiber supplement.  Switching to mild anti-diarrheal  medications (Kayopectate, Pepto Bismol) can help.  If this worsens or does not improve, please call us. 6. Wash / shower every day.  You may shower over the incision / wound.  Avoid baths until the skin is fully healed.  Continue to shower over incision(s) after the dressing is off. 7. Remove your waterproof bandages 5 days after surgery.  You may leave the incision open to air.  You may replace a dressing/Band-Aid to cover the incision for comfort if you wish. 8. ACTIVITIES as tolerated:   a. You may resume regular (light) daily activities beginning the next day-such as daily self-care, walking, climbing stairs-gradually increasing activities as tolerated.  If you can walk 30 minutes without difficulty, it is safe to try more intense activity such as jogging, treadmill, bicycling, low-impact aerobics, swimming, etc. b. Save the most intensive and strenuous activity for last such as sit-ups, heavy lifting, contact sports, etc  Refrain from any heavy lifting or straining until you are off narcotics for pain control.   c. DO NOT PUSH THROUGH PAIN.  Let pain be your guide: If it hurts to do something, don't do it.  Pain is your body warning you to avoid that activity for another week until the pain goes down. d. You may drive when you are no longer taking prescription pain medication, you can comfortably wear a seatbelt, and you can safely maneuver your car and apply brakes.  e. Bonita Quin may have sexual intercourse when it is comfortable.  9. FOLLOW UP in our office a. Please call [facility] at [phone] to set up an appointment to see your surgeon in the office for a follow-up appointment approximately 1-2 weeks after your surgery. b. Make sure that you call for this appointment the day you arrive home to insure a convenient appointment time. 10. IF YOU HAVE DISABILITY OR FAMILY LEAVE FORMS, BRING THEM TO THE OFFICE FOR PROCESSING.  DO NOT GIVE THEM TO YOUR DOCTOR.   WHEN TO CALL us [phone]: 1. Poor  pain control 2. Reactions / problems with new medications (rash/itching, nausea, etc)  3. Fever over 101.5 F (38.5 C) 4. Inability to urinate 5. Nausea and/or vomiting 6. Worsening swelling or bruising 7. Continued bleeding from incision. 8. Increased pain, redness, or drainage from the incision  The clinic staff is available to answer your questions during regular business hours (8:30am-5pm).  Please don't hesitate to call and ask to speak to one of our nurses for clinical concerns.   A surgeon from [facility] is always on call at the hospitals   If you have a medical emergency, go to the nearest emergency room or call 911.    [facility] [address], [place], [place]  [address] ? MAIN: [phone] ? TOLL FREE: [phone] ? FAX [phone] [email or web address]  GETTING TO GOOD BOWEL HEALTH. Irregular bowel habits such as constipation and diarrhea can lead to many problems over time.  Having one soft bowel movement a day is the most important way to prevent further problems.  The anorectal canal is designed to handle stretching and feces to safely manage our ability to get rid of solid waste (feces, poop, stool) out of our body.  BUT, hard constipated stools can act like ripping concrete bricks and diarrhea can be a burning fire to this very sensitive area of our body, causing inflamed hemorrhoids, anal fissures, increasing risk is perirectal abscesses, abdominal pain/bloating, an making irritable bowel worse.     The goal: ONE SOFT BOWEL MOVEMENT A DAY!  To have soft, regular bowel movements:    Drink at least 8 tall glasses of water a day.     Take plenty of fiber.  Fiber is the undigested part of plant food that passes into the colon, acting s "natures broom" to encourage bowel motility and movement.  Fiber can absorb and hold large amounts of water. This results in a larger, bulkier stool, which is soft and easier to  pass. Work gradually over several weeks up to 6 servings a day of fiber (25g a day even more if needed) in the form of: o Vegetables -- Root (potatoes, carrots, turnips), leafy green (lettuce, salad greens, celery, spinach), or cooked high residue (cabbage, broccoli, etc) o Fruit -- Fresh (unpeeled skin & pulp), Dried (prunes, apricots, cherries, etc ),  or stewed ( applesauce)  o Whole grain breads, pasta, etc (whole wheat)  o Bran cereals    Bulking Agents -- This type of water-retaining fiber generally is easily obtained each day by one of the following:  o Psyllium bran -- The psyllium plant is remarkable because its ground seeds can retain so much water. This product is available as Metamucil, Konsyl, Effersyllium, Per Diem Fiber, or the less expensive generic preparation in drug and health food stores. Although labeled a laxative, it really is not a laxative.  o Methylcellulose -- This is another  fiber derived from wood which also retains water. It is available as Citrucel. o Polyethylene Glycol - and "artificial" fiber commonly called Miralax or Glycolax.  It is helpful for people with gassy or bloated feelings with regular fiber o Flax Seed - a less gassy fiber than psyllium   No reading or other relaxing activity while on the toilet. If bowel movements take longer than 5 minutes, you are too constipated   AVOID CONSTIPATION.  High fiber and water intake usually takes care of this.  Sometimes a laxative is needed to stimulate more frequent bowel movements, but    Laxatives are not a good long-term solution as it can wear the colon out. o Osmotics (Milk of Magnesia, Fleets phosphosoda, Magnesium citrate, MiraLax, GoLytely) are safer than  o Stimulants (Senokot, Castor Oil, Dulcolax, Ex Lax)    o Do not take laxatives for more than 7days in a row.    IF SEVERELY CONSTIPATED, try a Bowel Retraining Program: o Do not use laxatives.  o Eat a diet high in roughage, such as bran cereals and leafy  vegetables.  o Drink six (6) ounces of prune or apricot juice each morning.  o Eat two (2) large servings of stewed fruit each day.  o Take one (1) heaping tablespoon of a psyllium-based bulking agent twice a day. Use sugar-free sweetener when possible to avoid excessive calories.  o Eat a normal breakfast.  o Set aside 15 minutes after breakfast to sit on the toilet, but do not strain to have a bowel movement.  o If you do not have a bowel movement by the third day, use an enema and repeat the above steps.    Controlling diarrhea o Switch to liquids and simpler foods for a few days to avoid stressing your intestines further. o Avoid dairy products (especially milk & ice cream) for a short time.  The intestines often can lose the ability to digest lactose when stressed. o Avoid foods that cause gassiness or bloating.  Typical foods include beans and other legumes, cabbage, broccoli, and dairy foods.  Every person has some sensitivity to other foods, so listen to our body and avoid those foods that trigger problems for you. o Adding fiber (Citrucel, Metamucil, psyllium, Miralax) gradually can help thicken stools by absorbing excess fluid and retrain the intestines to act more normally.  Slowly increase the dose over a few weeks.  Too much fiber too soon can backfire and cause cramping & bloating. o Probiotics (such as active yogurt, Align, etc) may help repopulate the intestines and colon with normal bacteria and calm down a sensitive digestive tract.  Most studies show it to be of mild help, though, and such products can be costly. o Medicines:   Bismuth subsalicylate (ex. Kayopectate, Pepto Bismol) every 30 minutes for up to 6 doses can help control diarrhea.  Avoid if pregnant.   Loperamide (Immodium) can slow down diarrhea.  Start with two tablets (4mg  total) first and then try one tablet every 6 hours.  Avoid if you are having fevers or severe pain.  If you are not better or start feeling worse,  stop all medicines and call your doctor for advice o Call your doctor if you are getting worse or not better.  Sometimes further testing (cultures, endoscopy, X-ray studies, bloodwork, etc) may be needed to help diagnose and treat the cause of the diarrhea.  Diverticulitis A diverticulum is a small pouch or sac on the colon. Diverticulosis is the presence of  these diverticula on the colon. Diverticulitis is the irritation (inflammation) or infection of diverticula. CAUSES  The colon and its diverticula contain bacteria. If food particles block the tiny opening to a diverticulum, the bacteria inside can grow and cause an increase in pressure. This leads to infection and inflammation and is called diverticulitis. SYMPTOMS   Abdominal pain and tenderness. Usually, the pain is located on the left side of your abdomen. However, it could be located elsewhere.  Fever.  Bloating.  Feeling sick to your stomach (nausea).  Throwing up (vomiting).  Abnormal stools. DIAGNOSIS  Your caregiver will take a history and perform a physical exam. Since many things can cause abdominal pain, other tests may be necessary. Tests may include:  Blood tests.  Urine tests.  X-ray of the abdomen.  CT scan of the abdomen. Sometimes, surgery is needed to determine if diverticulitis or other conditions are causing your symptoms. TREATMENT  Most of the time, you can be treated without surgery. Treatment includes:  Resting the bowels by only having liquids for a few days. As you improve, you will need to eat a low-fiber diet.  Intravenous (IV) fluids if you are losing body fluids (dehydrated).  Antibiotic medicines that treat infections may be given.  Pain and nausea medicine, if needed.  Surgery if the inflamed diverticulum has burst. HOME CARE INSTRUCTIONS   Try a clear liquid diet (broth, tea, or water for as long as directed by your caregiver). You may then gradually begin a low-fiber diet as  tolerated.  A low-fiber diet is a diet with less than 10 grams of fiber. Choose the foods below to reduce fiber in the diet:  White breads, cereals, rice, and pasta.  Cooked fruits and vegetables or soft fresh fruits and vegetables without the skin.  Ground or well-cooked tender beef, ham, veal, lamb, pork, or poultry.  Eggs and seafood.  After your diverticulitis symptoms have improved, your caregiver may put you on a high-fiber diet. A high-fiber diet includes 14 grams of fiber for every 1000 calories consumed. For a standard 2000 calorie diet, you would need 28 grams of fiber. Follow these diet guidelines to help you increase the fiber in your diet. It is important to slowly increase the amount fiber in your diet to avoid gas, constipation, and bloating.  Choose whole-grain breads, cereals, pasta, and brown rice.  Choose fresh fruits and vegetables with the skin on. Do not overcook vegetables because the more vegetables are cooked, the more fiber is lost.  Choose more nuts, seeds, legumes, dried peas, beans, and lentils.  Look for food products that have greater than 3 grams of fiber per serving on the Nutrition Facts label.  Take all medicine as directed by your caregiver.  If your caregiver has given you a follow-up appointment, it is very important that you go. Not going could result in lasting (chronic) or permanent injury, pain, and disability. If there is any problem keeping the appointment, call to reschedule. SEEK MEDICAL CARE IF:   Your pain does not improve.  You have a hard time advancing your diet beyond clear liquids.  Your bowel movements do not return to normal. SEEK IMMEDIATE MEDICAL CARE IF:   Your pain becomes worse.  You have an oral temperature above 102 F (38.9 C), not controlled by medicine.  You have repeated vomiting.  You have bloody or black, tarry stools.  Symptoms that brought you to your caregiver become worse or are not getting better. MAKE  SURE  YOU:   Understand these instructions.  Will watch your condition.  Will get help right away if you are not doing well or get worse. Document Released: 08/25/2005 Document Revised: 02/07/2012 Document Reviewed: 12/21/2010 [facility] Patient Information 2014 [place], [place].

## 2013-08-14 NOTE — Telephone Encounter (Signed)
I will not be in until Friday. Can she come in then?

## 2013-08-15 NOTE — Telephone Encounter (Signed)
Spoke with patient regarding this. States she feels confident enough to proceed with this surgery. Has a 6 mo followup next week.

## 2013-08-20 ENCOUNTER — Other Ambulatory Visit: Payer: Managed Care, Other (non HMO) | Admitting: Internal Medicine

## 2013-08-20 DIAGNOSIS — E785 Hyperlipidemia, unspecified: Secondary | ICD-10-CM

## 2013-08-20 DIAGNOSIS — Z79899 Other long term (current) drug therapy: Secondary | ICD-10-CM

## 2013-08-20 DIAGNOSIS — E039 Hypothyroidism, unspecified: Secondary | ICD-10-CM

## 2013-08-20 LAB — LIPID PANEL
HDL: 75 mg/dL (ref >39)
LDL Cholesterol: 109 mg/dL — ABNORMAL HIGH (ref 0–99)

## 2013-08-20 LAB — HEPATIC FUNCTION PANEL
AST: 20 U/L (ref 0–37)
Albumin: 4.7 g/dL (ref 3.5–5.2)
Alkaline Phosphatase: 54 U/L (ref 39–117)
Bilirubin, Direct: 0.1 mg/dL (ref 0.0–0.3)
Indirect Bilirubin: 0.7 mg/dL (ref 0.0–0.9)
Total Bilirubin: 0.8 mg/dL (ref 0.3–1.2)

## 2013-08-21 ENCOUNTER — Ambulatory Visit (INDEPENDENT_AMBULATORY_CARE_PROVIDER_SITE_OTHER): Payer: Managed Care, Other (non HMO) | Admitting: Internal Medicine

## 2013-08-21 ENCOUNTER — Encounter: Payer: Self-pay | Admitting: Internal Medicine

## 2013-08-21 VITALS — BP 134/76 | HR 76 | Wt 129.0 lb

## 2013-08-21 DIAGNOSIS — E039 Hypothyroidism, unspecified: Secondary | ICD-10-CM

## 2013-08-21 DIAGNOSIS — Z23 Encounter for immunization: Secondary | ICD-10-CM

## 2013-08-21 NOTE — Patient Instructions (Addendum)
Clear for surgery in 2 weeks. Continue same dose of Synthroid. Return in 6 months for physical examination. Flu vaccine given today

## 2013-08-21 NOTE — Progress Notes (Signed)
  Subjective:    Patient ID: Pamela [name], female    DOB: February 09, 1946, 67 y.o.   MRN: [id number]  HPI Patient is here today to followup on hypothyroidism. This is her six-month recheck. In the interim, she's had several bouts of diverticulitis and is scheduled for colonic resection with Dr. Michaell Cowing in about 2 weeks. Has involvement of sigmoid and descending colon. TSH is a bit low on current dose of Synthroid but she feels well on this dose and were going to leave it at the same dose for now. Influenza vaccine given today. Surgery will be done with a Chemical engineer. She's planning to retire early 2015. She also has a history of hyperlipidemia and is on low-dose Zocor 10 mg daily. Lipid panel liver functions are acceptable.    Review of Systems     Objective:   Physical Exam Neck is supple without thyromegaly. Chest clear to auscultation. Cardiac exam regular rate and rhythm normal S1 and S2. Extremities without edema. Skin is warm and dry.       Assessment & Plan:  Recurrent diverticulitis schedule for colonic resection in a couple of weeks  Hypothyroidism-TSH is low on current dose of Synthroid but will repeat in 6 months and continue with same dose for now.  Hyperlipidemia-stable on Zocor 10 mg daily  Plan: Influenza vaccine given today.

## 2013-08-23 ENCOUNTER — Encounter (HOSPITAL_COMMUNITY): Payer: Self-pay | Admitting: Pharmacy Technician

## 2013-08-27 ENCOUNTER — Telehealth (INDEPENDENT_AMBULATORY_CARE_PROVIDER_SITE_OTHER): Payer: Self-pay

## 2013-08-27 NOTE — Telephone Encounter (Signed)
LMOM on cell b/c home # busy. I want the pt to call me so I can go over a few things before surgery next week per Dr Michaell Cowing.

## 2013-08-28 ENCOUNTER — Other Ambulatory Visit (HOSPITAL_COMMUNITY): Payer: Self-pay | Admitting: Surgery

## 2013-08-28 NOTE — Patient Instructions (Addendum)
20 EYANA STOLZE  08/28/2013   Your procedure is scheduled on: 09-04-2013  Report to [facility] at 600 AM.  Call this number if you have problems the morning of surgery [phone]   Remember:   Do not eat food or drink liquids :After Midnight.     Take these medicines the morning of surgery with A SIP OF WATER: none                                SEE [facility] PREPARING FOR SURGERY SHEET             You may not have any metal on your body including hair pins and piercings  Do not wear jewelry, make-up.  Do not wear lotions, powders, or perfumes. You may wear deodorant.   Men may shave face and neck.  Do not bring valuables to the hospital. [facility] IS NOT RESPONSIBLE FOR VALUEABLES.  Contacts, dentures or bridgework may not be worn into surgery.  Leave suitcase in the car. After surgery it may be brought to your room.  For patients admitted to the hospital, checkout time is 11:00 AM the day of discharge.   Patients discharged the day of surgery will not be allowed to drive home.  Name and phone number of your driver:  Special Instructions: N/A   Please read over the following fact sheets that you were given: blood fact  sheet  Call Cain Sieve RN pre op nurse if needed [phone]    FAILURE TO FOLLOW THESE INSTRUCTIONS MAY RESULT IN THE CANCELLATION OF YOUR SURGERY.  PATIENT SIGNATURE___________________________________________  NURSE SIGNATURE_____________________________________________

## 2013-08-29 ENCOUNTER — Encounter (HOSPITAL_COMMUNITY)
Admission: RE | Admit: 2013-08-29 | Discharge: 2013-08-29 | Disposition: A | Discharge status: Home / Self Care | Payer: Managed Care, Other (non HMO) | Source: Ambulatory Visit | Attending: Surgery | Admitting: Surgery

## 2013-08-29 ENCOUNTER — Ambulatory Visit (HOSPITAL_COMMUNITY)
Admission: RE | Admit: 2013-08-29 | Discharge: 2013-08-29 | Disposition: A | Discharge status: Home / Self Care | Payer: Managed Care, Other (non HMO) | Source: Ambulatory Visit | Attending: Surgery | Admitting: Surgery

## 2013-08-29 ENCOUNTER — Encounter (HOSPITAL_COMMUNITY): Payer: Self-pay

## 2013-08-29 DIAGNOSIS — Z01818 Encounter for other preprocedural examination: Secondary | ICD-10-CM | POA: Insufficient documentation

## 2013-08-29 DIAGNOSIS — Z01812 Encounter for preprocedural laboratory examination: Secondary | ICD-10-CM | POA: Insufficient documentation

## 2013-08-29 DIAGNOSIS — I517 Cardiomegaly: Secondary | ICD-10-CM | POA: Insufficient documentation

## 2013-08-29 DIAGNOSIS — Z01810 Encounter for preprocedural cardiovascular examination: Secondary | ICD-10-CM | POA: Insufficient documentation

## 2013-08-29 HISTORY — DX: Adverse effect of unspecified anesthetic, initial encounter: T41.45XA

## 2013-08-29 LAB — ABO/RH: ABO/RH(D): AB POS

## 2013-08-29 LAB — BASIC METABOLIC PANEL
Chloride: 103 mEq/L (ref 96–112)
GFR calc Af Amer: >90 mL/min (ref >90)
GFR calc non Af Amer: 89 mL/min — ABNORMAL LOW (ref >90)
Glucose, Bld: 93 mg/dL (ref 70–99)
Potassium: 4.2 mEq/L (ref 3.5–5.1)
Sodium: 138 mEq/L (ref 135–145)

## 2013-08-29 LAB — CBC
Hemoglobin: 13.7 g/dL (ref 12.0–15.0)
MCH: 29.7 pg (ref 26.0–34.0)
RBC: 4.62 MIL/uL (ref 3.87–5.11)
WBC: 5.7 10*3/uL (ref 4.0–10.5)

## 2013-09-03 NOTE — Progress Notes (Signed)
anesthesia record requested from [facility] 08-29-2013  from 1980;s surgery,  No records received.

## 2013-09-04 ENCOUNTER — Encounter (HOSPITAL_COMMUNITY): Payer: Self-pay | Admitting: *Deleted

## 2013-09-04 ENCOUNTER — Encounter (HOSPITAL_COMMUNITY): Payer: Self-pay | Admitting: Anesthesiology

## 2013-09-04 ENCOUNTER — Inpatient Hospital Stay (HOSPITAL_COMMUNITY): Payer: Managed Care, Other (non HMO) | Admitting: Anesthesiology

## 2013-09-04 ENCOUNTER — Inpatient Hospital Stay (HOSPITAL_COMMUNITY)
Admission: RE | Admit: 2013-09-04 | Discharge: 2013-09-07 | DRG: 331 | Disposition: A | Discharge status: Home / Self Care | Payer: Managed Care, Other (non HMO) | Source: Ambulatory Visit | Attending: Surgery | Admitting: Surgery

## 2013-09-04 ENCOUNTER — Encounter (HOSPITAL_COMMUNITY)
Admission: RE | Disposition: A | Discharge status: Home / Self Care | Payer: Self-pay | Source: Ambulatory Visit | Attending: Surgery

## 2013-09-04 DIAGNOSIS — K219 Gastro-esophageal reflux disease without esophagitis: Secondary | ICD-10-CM | POA: Diagnosis present

## 2013-09-04 DIAGNOSIS — Z800 Family history of malignant neoplasm of digestive organs: Secondary | ICD-10-CM

## 2013-09-04 DIAGNOSIS — Z860100 Personal history of colon polyps, unspecified: Secondary | ICD-10-CM

## 2013-09-04 DIAGNOSIS — K5732 Diverticulitis of large intestine without perforation or abscess without bleeding: Secondary | ICD-10-CM | POA: Diagnosis present

## 2013-09-04 DIAGNOSIS — I10 Essential (primary) hypertension: Secondary | ICD-10-CM | POA: Diagnosis present

## 2013-09-04 DIAGNOSIS — M899 Disorder of bone, unspecified: Secondary | ICD-10-CM | POA: Diagnosis present

## 2013-09-04 DIAGNOSIS — E039 Hypothyroidism, unspecified: Secondary | ICD-10-CM | POA: Diagnosis present

## 2013-09-04 DIAGNOSIS — K5730 Diverticulosis of large intestine without perforation or abscess without bleeding: Secondary | ICD-10-CM

## 2013-09-04 DIAGNOSIS — R112 Nausea with vomiting, unspecified: Secondary | ICD-10-CM | POA: Diagnosis not present

## 2013-09-04 LAB — CBC
HCT: 37.5 % (ref 36.0–46.0)
Hemoglobin: 13 g/dL (ref 12.0–15.0)
MCHC: 34.7 g/dL (ref 30.0–36.0)
MCV: 86.4 fL (ref 78.0–100.0)
Platelets: 226 10*3/uL (ref 150–400)
RBC: 4.34 MIL/uL (ref 3.87–5.11)
RDW: 12.6 % (ref 11.5–15.5)
WBC: 11.1 10*3/uL — ABNORMAL HIGH (ref 4.0–10.5)

## 2013-09-04 LAB — CREATININE, SERUM
Creatinine, Ser: 0.64 mg/dL (ref 0.50–1.10)
GFR calc Af Amer: >90 mL/min (ref >90)
GFR calc non Af Amer: >90 mL/min (ref >90)

## 2013-09-04 LAB — TYPE AND SCREEN
ABO/RH(D): AB POS
Antibody Screen: NEGATIVE

## 2013-09-04 SURGERY — ROBOT ASSISTED LAPAROSCOPIC PARTIAL COLECTOMY
Anesthesia: General | Wound class: Contaminated

## 2013-09-04 MED ORDER — PHENYLEPHRINE HCL 10 MG/ML IJ SOLN
INTRAMUSCULAR | Status: DC | PRN
  Administered 2013-09-04 (×4): 40 ug via INTRAVENOUS

## 2013-09-04 MED ORDER — SACCHAROMYCES BOULARDII 250 MG PO CAPS
250.0000 mg | ORAL_CAPSULE | Freq: Two times a day (BID) | ORAL | Status: DC
  Administered 2013-09-04 – 2013-09-07 (×6): 250 mg via ORAL
  Filled 2013-09-04 (×7): qty 1

## 2013-09-04 MED ORDER — PROMETHAZINE HCL 25 MG/ML IJ SOLN
6.2500 mg | Freq: Four times a day (QID) | INTRAMUSCULAR | Status: DC | PRN
  Administered 2013-09-04: 6.25 mg via INTRAVENOUS
  Filled 2013-09-04: qty 1

## 2013-09-04 MED ORDER — DIPHENHYDRAMINE HCL 12.5 MG/5ML PO ELIX: 12.5000 mg | ORAL_SOLUTION | Freq: Four times a day (QID) | ORAL | Status: DC | PRN

## 2013-09-04 MED ORDER — HYDROMORPHONE HCL PF 1 MG/ML IJ SOLN
0.5000 mg | INTRAMUSCULAR | Status: DC | PRN
  Administered 2013-09-05 – 2013-09-06 (×3): 1 mg via INTRAVENOUS
  Filled 2013-09-04 (×3): qty 1

## 2013-09-04 MED ORDER — ROCURONIUM BROMIDE 100 MG/10ML IV SOLN
INTRAVENOUS | Status: DC | PRN
  Administered 2013-09-04: 10 mg via INTRAVENOUS
  Administered 2013-09-04: 20 mg via INTRAVENOUS
  Administered 2013-09-04: 50 mg via INTRAVENOUS
  Administered 2013-09-04 (×2): 20 mg via INTRAVENOUS

## 2013-09-04 MED ORDER — BUPIVACAINE 0.25 % ON-Q PUMP DUAL CATH 300 ML
300.0000 mL | INJECTION | Status: DC
  Filled 2013-09-04: qty 300

## 2013-09-04 MED ORDER — MIDAZOLAM HCL 5 MG/5ML IJ SOLN
INTRAMUSCULAR | Status: DC | PRN
  Administered 2013-09-04: 2 mg via INTRAVENOUS

## 2013-09-04 MED ORDER — GENTAMICIN SULFATE 40 MG/ML IJ SOLN
300.0000 mg | INTRAMUSCULAR | Status: AC
  Administered 2013-09-04: 300 mg via INTRAVENOUS
  Filled 2013-09-04: qty 7.5

## 2013-09-04 MED ORDER — ONDANSETRON HCL 4 MG/2ML IJ SOLN
4.0000 mg | Freq: Four times a day (QID) | INTRAMUSCULAR | Status: DC | PRN
  Administered 2013-09-04 – 2013-09-05 (×2): 4 mg via INTRAVENOUS
  Filled 2013-09-04 (×2): qty 2

## 2013-09-04 MED ORDER — ALVIMOPAN 12 MG PO CAPS
12.0000 mg | ORAL_CAPSULE | Freq: Two times a day (BID) | ORAL | Status: DC
  Administered 2013-09-05 – 2013-09-06 (×3): 12 mg via ORAL
  Filled 2013-09-04 (×6): qty 1

## 2013-09-04 MED ORDER — DIPHENHYDRAMINE HCL 50 MG/ML IJ SOLN: 12.5000 mg | Freq: Four times a day (QID) | INTRAMUSCULAR | Status: DC | PRN

## 2013-09-04 MED ORDER — NEOMYCIN SULFATE 500 MG PO TABS: 1000.0000 mg | ORAL_TABLET | ORAL | Status: DC

## 2013-09-04 MED ORDER — KCL IN DEXTROSE-NACL 40-5-0.9 MEQ/L-%-% IV SOLN
INTRAVENOUS | Status: DC
  Administered 2013-09-04 – 2013-09-05 (×2): via INTRAVENOUS
  Filled 2013-09-04 (×4): qty 1000

## 2013-09-04 MED ORDER — BUPIVACAINE-EPINEPHRINE PF 0.25-1:200000 % IJ SOLN
INTRAMUSCULAR | Status: AC
  Filled 2013-09-04: qty 60

## 2013-09-04 MED ORDER — ACETAMINOPHEN 10 MG/ML IV SOLN
INTRAVENOUS | Status: DC | PRN
  Administered 2013-09-04: 1000 mg via INTRAVENOUS

## 2013-09-04 MED ORDER — METOPROLOL TARTRATE 1 MG/ML IV SOLN
5.0000 mg | Freq: Four times a day (QID) | INTRAVENOUS | Status: DC | PRN
  Filled 2013-09-04: qty 5

## 2013-09-04 MED ORDER — LACTATED RINGERS IV SOLN
INTRAVENOUS | Status: DC | PRN
  Administered 2013-09-04 (×2): via INTRAVENOUS

## 2013-09-04 MED ORDER — HYDROMORPHONE HCL PF 1 MG/ML IJ SOLN: 0.2500 mg | INTRAMUSCULAR | Status: DC | PRN

## 2013-09-04 MED ORDER — PROPOFOL 10 MG/ML IV BOLUS
INTRAVENOUS | Status: DC | PRN
  Administered 2013-09-04: 120 mg via INTRAVENOUS

## 2013-09-04 MED ORDER — HYDRALAZINE HCL 20 MG/ML IJ SOLN
INTRAMUSCULAR | Status: AC
  Filled 2013-09-04: qty 1

## 2013-09-04 MED ORDER — LIP MEDEX EX OINT
1.0000 "application " | TOPICAL_OINTMENT | Freq: Two times a day (BID) | CUTANEOUS | Status: DC
  Administered 2013-09-04 – 2013-09-07 (×6): 1 via TOPICAL
  Filled 2013-09-04 (×3): qty 7

## 2013-09-04 MED ORDER — LACTATED RINGERS IR SOLN
Status: DC | PRN
  Administered 2013-09-04: 1000 mL

## 2013-09-04 MED ORDER — ACETAMINOPHEN 500 MG PO TABS
1000.0000 mg | ORAL_TABLET | Freq: Three times a day (TID) | ORAL | Status: DC
  Administered 2013-09-04 – 2013-09-07 (×9): 1000 mg via ORAL
  Filled 2013-09-04 (×12): qty 2

## 2013-09-04 MED ORDER — ALUM & MAG HYDROXIDE-SIMETH 200-200-20 MG/5ML PO SUSP
30.0000 mL | Freq: Four times a day (QID) | ORAL | Status: DC | PRN
  Administered 2013-09-06: 30 mL via ORAL
  Filled 2013-09-04: qty 30

## 2013-09-04 MED ORDER — SODIUM CHLORIDE 0.9 % IV SOLN
INTRAVENOUS | Status: DC
  Filled 2013-09-04: qty 6

## 2013-09-04 MED ORDER — BUPIVACAINE ON-Q PAIN PUMP (FOR ORDER SET NO CHG)
INJECTION | Status: DC
  Filled 2013-09-04: qty 1

## 2013-09-04 MED ORDER — HEPARIN SODIUM (PORCINE) 5000 UNIT/ML IJ SOLN
5000.0000 [IU] | Freq: Once | INTRAMUSCULAR | Status: AC
  Administered 2013-09-04: 5000 [IU] via SUBCUTANEOUS
  Filled 2013-09-04: qty 1

## 2013-09-04 MED ORDER — LEVOTHYROXINE SODIUM 100 MCG PO TABS
100.0000 ug | ORAL_TABLET | Freq: Every day | ORAL | Status: DC
  Administered 2013-09-04 – 2013-09-06 (×3): 100 ug via ORAL
  Filled 2013-09-04 (×4): qty 1

## 2013-09-04 MED ORDER — PROMETHAZINE HCL 25 MG/ML IJ SOLN: 6.2500 mg | INTRAMUSCULAR | Status: DC | PRN

## 2013-09-04 MED ORDER — HYDRALAZINE HCL 20 MG/ML IJ SOLN
5.0000 mg | Freq: Once | INTRAMUSCULAR | Status: AC
  Administered 2013-09-04: 5 mg via INTRAVENOUS

## 2013-09-04 MED ORDER — ALVIMOPAN 12 MG PO CAPS
12.0000 mg | ORAL_CAPSULE | Freq: Once | ORAL | Status: AC
  Administered 2013-09-04: 12 mg via ORAL
  Filled 2013-09-04: qty 1

## 2013-09-04 MED ORDER — NEOSTIGMINE METHYLSULFATE 1 MG/ML IJ SOLN
INTRAMUSCULAR | Status: DC | PRN
  Administered 2013-09-04: 3.5 mg via INTRAVENOUS

## 2013-09-04 MED ORDER — HEPARIN SODIUM (PORCINE) 5000 UNIT/ML IJ SOLN
5000.0000 [IU] | Freq: Three times a day (TID) | INTRAMUSCULAR | Status: DC
  Administered 2013-09-04 – 2013-09-07 (×8): 5000 [IU] via SUBCUTANEOUS
  Filled 2013-09-04 (×11): qty 1

## 2013-09-04 MED ORDER — CLINDAMYCIN PHOSPHATE 900 MG/50ML IV SOLN
900.0000 mg | INTRAVENOUS | Status: AC
Start: 2013-09-04 — End: 2013-09-04
  Administered 2013-09-04: 900 mg via INTRAVENOUS

## 2013-09-04 MED ORDER — STERILE WATER FOR IRRIGATION IR SOLN
Status: DC | PRN
  Administered 2013-09-04: 3000 mL

## 2013-09-04 MED ORDER — VITAMIN D3 25 MCG (1000 UNIT) PO TABS
1000.0000 [IU] | ORAL_TABLET | Freq: Every day | ORAL | Status: DC
  Administered 2013-09-05 – 2013-09-07 (×3): 1000 [IU] via ORAL
  Filled 2013-09-04 (×3): qty 1

## 2013-09-04 MED ORDER — FENTANYL CITRATE 0.05 MG/ML IJ SOLN
INTRAMUSCULAR | Status: DC | PRN
  Administered 2013-09-04: 100 ug via INTRAVENOUS
  Administered 2013-09-04 (×3): 50 ug via INTRAVENOUS

## 2013-09-04 MED ORDER — BUPIVACAINE-EPINEPHRINE 0.25% -1:200000 IJ SOLN
INTRAMUSCULAR | Status: DC | PRN
  Administered 2013-09-04: 60 mL

## 2013-09-04 MED ORDER — LACTATED RINGERS IV SOLN: INTRAVENOUS | Status: DC

## 2013-09-04 MED ORDER — ONDANSETRON HCL 4 MG/2ML IJ SOLN
INTRAMUSCULAR | Status: DC | PRN
  Administered 2013-09-04: 4 mg via INTRAMUSCULAR

## 2013-09-04 MED ORDER — ZOLPIDEM TARTRATE 5 MG PO TABS: 5.0000 mg | ORAL_TABLET | Freq: Every evening | ORAL | Status: DC | PRN

## 2013-09-04 MED ORDER — SIMVASTATIN 10 MG PO TABS
10.0000 mg | ORAL_TABLET | Freq: Every day | ORAL | Status: DC
  Administered 2013-09-04 – 2013-09-06 (×3): 10 mg via ORAL
  Filled 2013-09-04 (×4): qty 1

## 2013-09-04 MED ORDER — HYDROMORPHONE HCL PF 1 MG/ML IJ SOLN
INTRAMUSCULAR | Status: DC | PRN
  Administered 2013-09-04: 1 mg via INTRAVENOUS
  Administered 2013-09-04 (×2): 0.5 mg via INTRAVENOUS
  Administered 2013-09-04: 1 mg via INTRAVENOUS

## 2013-09-04 MED ORDER — LIDOCAINE HCL (CARDIAC) 20 MG/ML IV SOLN
INTRAVENOUS | Status: DC | PRN
  Administered 2013-09-04: 50 mg via INTRAVENOUS

## 2013-09-04 MED ORDER — GLYCOPYRROLATE 0.2 MG/ML IJ SOLN
INTRAMUSCULAR | Status: DC | PRN
  Administered 2013-09-04: .7 mg via INTRAVENOUS

## 2013-09-04 MED ORDER — LACTATED RINGERS IV BOLUS (SEPSIS): 1000.0000 mL | Freq: Three times a day (TID) | INTRAVENOUS | Status: DC | PRN

## 2013-09-04 MED ORDER — DEXAMETHASONE SODIUM PHOSPHATE 10 MG/ML IJ SOLN
INTRAMUSCULAR | Status: DC | PRN
  Administered 2013-09-04: 10 mg via INTRAVENOUS

## 2013-09-04 MED ORDER — CLINDAMYCIN PHOSPHATE 900 MG/50ML IV SOLN
INTRAVENOUS | Status: AC
  Filled 2013-09-04: qty 50

## 2013-09-04 MED ORDER — CLINDAMYCIN PHOSPHATE 600 MG/50ML IV SOLN
600.0000 mg | Freq: Four times a day (QID) | INTRAVENOUS | Status: DC
  Administered 2013-09-04 – 2013-09-05 (×3): 600 mg via INTRAVENOUS
  Filled 2013-09-04 (×4): qty 50

## 2013-09-04 MED ORDER — KETOROLAC TROMETHAMINE 30 MG/ML IJ SOLN: 15.0000 mg | Freq: Once | INTRAMUSCULAR | Status: DC | PRN

## 2013-09-04 MED ORDER — OXYCODONE HCL 5 MG PO TABS: 5.0000 mg | ORAL_TABLET | ORAL | Status: DC | PRN

## 2013-09-04 MED ORDER — ACETAMINOPHEN 10 MG/ML IV SOLN
1000.0000 mg | Freq: Once | INTRAVENOUS | Status: DC
  Filled 2013-09-04: qty 100

## 2013-09-04 SURGICAL SUPPLY — 94 items
APPLIER CLIP 5 13 M/L LIGAMAX5 (MISCELLANEOUS)
APPLIER CLIP ROT 10 11.4 M/L (STAPLE)
BAG URINE DRAINAGE (UROLOGICAL SUPPLIES) IMPLANT
BLADE EXTENDED COATED 6.5IN (ELECTRODE) IMPLANT
BLADE HEX COATED 2.75 (ELECTRODE) ×2 IMPLANT
BLADE SURG SZ10 CARB STEEL (BLADE) IMPLANT
CABLE HIGH FREQUENCY MONO STRZ (ELECTRODE) IMPLANT
CANISTER SUCTION 2500CC (MISCELLANEOUS) ×2 IMPLANT
CATH FOLEY SILVER 30CC 28FR (CATHETERS) IMPLANT
CATH KIT ON Q 7.5IN SLV (PAIN MANAGEMENT) ×4 IMPLANT
CELLS DAT CNTRL 66122 CELL SVR (MISCELLANEOUS) ×1 IMPLANT
CHLORAPREP W/TINT 26ML (MISCELLANEOUS) ×2 IMPLANT
CLIP APPLIE 5 13 M/L LIGAMAX5 (MISCELLANEOUS) IMPLANT
CLIP APPLIE ROT 10 11.4 M/L (STAPLE) IMPLANT
CLOTH BEACON ORANGE TIMEOUT ST (SAFETY) ×2 IMPLANT
COVER MAYO STAND STRL (DRAPES) ×4 IMPLANT
DECANTER SPIKE VIAL GLASS SM (MISCELLANEOUS) ×2 IMPLANT
DRAIN CHANNEL RND F F (WOUND CARE) IMPLANT
DRAPE CAMERA CLOSED 9X96 (DRAPES) ×2 IMPLANT
DRAPE CAMERA HEAD DAVINCI SI (DRAPES) ×2 IMPLANT
DRAPE INSTRUMENT ARM DA VINCI (DRAPES)
DRAPE INSTRUMENT ARM DVNC (DRAPES) IMPLANT
DRAPE LAPAROSCOPIC ABDOMINAL (DRAPES) ×2 IMPLANT
DRAPE LG THREE QUARTER DISP (DRAPES) ×6 IMPLANT
DRAPE TABLE BACK 44X90 PK DISP (DRAPES) ×6 IMPLANT
DRAPE UTILITY W/TAPE 26X15 (DRAPES) ×2 IMPLANT
DRAPE WARM FLUID 44X44 (DRAPE) ×4 IMPLANT
DRSG OPSITE POSTOP 4X6 (GAUZE/BANDAGES/DRESSINGS) ×2 IMPLANT
DRSG OPSITE POSTOP 4X8 (GAUZE/BANDAGES/DRESSINGS) ×2 IMPLANT
DRSG TEGADERM 2-3/8X2-3/4 SM (GAUZE/BANDAGES/DRESSINGS) ×2 IMPLANT
DRSG TEGADERM 4X4.75 (GAUZE/BANDAGES/DRESSINGS) IMPLANT
ELECT REM PT RETURN 9FT ADLT (ELECTROSURGICAL) ×2
ELECTRODE REM PT RTRN 9FT ADLT (ELECTROSURGICAL) ×1 IMPLANT
EVACUATOR SILICONE 100CC (DRAIN) IMPLANT
FILTER SMOKE EVAC LAPAROSHD (FILTER) IMPLANT
GAUZE SPONGE 2X2 8PLY STRL LF (GAUZE/BANDAGES/DRESSINGS) ×5 IMPLANT
GLOVE ECLIPSE 8.0 STRL XLNG CF (GLOVE) ×6 IMPLANT
GLOVE INDICATOR 8.0 STRL GRN (GLOVE) ×6 IMPLANT
GOWN PREVENTION PLUS LG XLONG (DISPOSABLE) IMPLANT
GOWN STRL REIN XL XLG (GOWN DISPOSABLE) ×14 IMPLANT
KIT BASIN OR (CUSTOM PROCEDURE TRAY) ×2 IMPLANT
LEGGING LITHOTOMY PAIR STRL (DRAPES) ×2 IMPLANT
LIGASURE IMPACT 36 18CM CVD LR (INSTRUMENTS) IMPLANT
NS IRRIG 1000ML POUR BTL (IV SOLUTION) ×2 IMPLANT
PACK GENERAL/GYN (CUSTOM PROCEDURE TRAY) ×2 IMPLANT
PENCIL BUTTON HOLSTER BLD 10FT (ELECTRODE) ×2 IMPLANT
RTRCTR WOUND ALEXIS 18CM MED (MISCELLANEOUS) ×2
SCISSORS ENDO CVD 5DCS (MISCELLANEOUS) ×2 IMPLANT
SCISSORS LAP 5X35 DISP (ENDOMECHANICALS) IMPLANT
SEALER TISSUE G2 CVD JAW 35 (ENDOMECHANICALS) ×1 IMPLANT
SEALER TISSUE G2 CVD JAW 45CM (ENDOMECHANICALS) ×1
SET IRRIG TUBING LAPAROSCOPIC (IRRIGATION / IRRIGATOR) ×2 IMPLANT
SLEEVE Z-THREAD 5X100MM (TROCAR) IMPLANT
SPONGE GAUZE 2X2 STER 10/PKG (GAUZE/BANDAGES/DRESSINGS) ×5
SPONGE GAUZE 4X4 12PLY (GAUZE/BANDAGES/DRESSINGS) IMPLANT
SPONGE LAP 18X18 X RAY DECT (DISPOSABLE) IMPLANT
STAPLER CIRC CVD 29MM 37CM (STAPLE) ×2 IMPLANT
STAPLER CUT CVD 40MM BLUE (STAPLE) ×2 IMPLANT
STAPLER VISISTAT 35W (STAPLE) IMPLANT
SUCTION POOLE TIP (SUCTIONS) ×2 IMPLANT
SUT ETHILON 2 0 PS N (SUTURE) ×4 IMPLANT
SUT MNCRL AB 4-0 PS2 18 (SUTURE) ×4 IMPLANT
SUT PDS AB 1 CTX 36 (SUTURE) ×4 IMPLANT
SUT PDS AB 1 TP1 96 (SUTURE) IMPLANT
SUT PROLENE 0 CT 2 (SUTURE) ×2 IMPLANT
SUT PROLENE 2 0 CT2 30 (SUTURE) IMPLANT
SUT PROLENE 2 0 KS (SUTURE) IMPLANT
SUT SILK 2 0 (SUTURE) ×1
SUT SILK 2 0 SH CR/8 (SUTURE) ×2 IMPLANT
SUT SILK 2-0 18XBRD TIE 12 (SUTURE) ×1 IMPLANT
SUT SILK 3 0 (SUTURE) ×1
SUT SILK 3 0 SH CR/8 (SUTURE) ×2 IMPLANT
SUT SILK 3-0 18XBRD TIE 12 (SUTURE) ×1 IMPLANT
SUT VIC AB 2-0 SH 18 (SUTURE) IMPLANT
SUT VICRYL 2 0 18  UND BR (SUTURE)
SUT VICRYL 2 0 18 UND BR (SUTURE) IMPLANT
SYR 30ML LL (SYRINGE) IMPLANT
SYR BULB IRRIGATION 50ML (SYRINGE) IMPLANT
SYRINGE IRR TOOMEY STRL 70CC (SYRINGE) IMPLANT
SYS LAPSCP GELPORT 120MM (MISCELLANEOUS)
SYSTEM LAPSCP GELPORT 120MM (MISCELLANEOUS) IMPLANT
TOWEL OR 17X26 10 PK STRL BLUE (TOWEL DISPOSABLE) ×6 IMPLANT
TRAY FOLEY CATH 14FRSI W/METER (CATHETERS) ×2 IMPLANT
TRAY LAP CHOLE (CUSTOM PROCEDURE TRAY) ×2 IMPLANT
TROCAR 12M 150ML BLUNT (TROCAR) ×2 IMPLANT
TROCAR BLADELESS OPT 5 100 (ENDOMECHANICALS) ×2 IMPLANT
TROCAR XCEL BLADELESS 5X75MML (TROCAR) ×2 IMPLANT
TROCAR XCEL NON-BLD 11X100MML (ENDOMECHANICALS) IMPLANT
TROCAR XCEL UNIV SLVE 11M 100M (ENDOMECHANICALS) IMPLANT
TUBING FILTER THERMOFLATOR (ELECTROSURGICAL) ×2 IMPLANT
TUNNELER SHEATH ON-Q 16GX12 DP (PAIN MANAGEMENT) ×2 IMPLANT
WATER STERILE IRR 1500ML POUR (IV SOLUTION) IMPLANT
YANKAUER SUCT BULB TIP 10FT TU (MISCELLANEOUS) ×2 IMPLANT
YANKAUER SUCT BULB TIP NO VENT (SUCTIONS) ×2 IMPLANT

## 2013-09-04 NOTE — Op Note (Signed)
09/04/2013  12:47 PM  PATIENT:  Pamela [name]  67 y.o. female  Patient Care Team: [name], MD as PCP - General (Internal Medicine) [name], MD as Consulting Physician (Gastroenterology) [name], MD as Consulting Physician (General Surgery)  PRE-OPERATIVE DIAGNOSIS:  Recurrent sigmoid diverticulitis   POST-OPERATIVE DIAGNOSIS:  Recurrent sigmoid diverticulitis   PROCEDURE:  Procedure(s): ROBOT ASSISTED SIGMOID COLECTOMY RIGID PROCTOSCOPY  SURGEON:  Surgeon(s): [name], MD [name], MD - Asst  ANESTHESIA:   local and general  EBL:  Total I/O In: 1000 [I.V.:1000] Out: 250 [Urine:200; Blood:50]  Delay start of Pharmacological VTE agent (>24hrs) due to surgical blood loss or risk of bleeding:  no  DRAINS: none   SPECIMEN:  Source of Specimen:    Sigmoid colon.  Open end proximal. Anastomotic rings.  Prolene stitch in proximal right  DISPOSITION OF SPECIMEN:  PATHOLOGY  COUNTS:  YES  PLAN OF CARE: Admit to inpatient   PATIENT DISPOSITION:  PACU - hemodynamically stable.  INDICATION:    Pleasant woman with recurrent sigmoid diverticulitis.   Has been 1-2 episodes for years.  Had three episodes this summer time. Her husband required an emergency colectomy/ostomy heart procedure for perforated diverticulitis last year.  Took some time to recover.  Patient more interested in surgery now.  I recommended segmental resection:  The anatomy & physiology of the digestive tract was discussed.  The pathophysiology was discussed.  Natural history risks without surgery was discussed.   I worked to give an overview of the disease and the frequent need to have multispecialty involvement.  I feel the risks of no intervention will lead to serious problems that outweigh the operative risks; therefore, I recommended a partial colectomy to remove the pathology.  Laparoscopic & open techniques were discussed.   Risks such as bleeding, infection, abscess, leak,  reoperation, possible ostomy, hernia, heart attack, death, and other risks were discussed.  I noted a good likelihood this will help address the problem.   Goals of post-operative recovery were discussed as well.  We will work to minimize complications.  Educational materials on the pathology had been given in the office.  Questions were answered.    The patient expressed understanding & wished to proceed with surgery.  OR FINDINGS:   Patient had Inflamed colon at the descending/sigmoid colon junction.  Some redundant rectosigmoid colon as well.  No obvious metastatic disease on visceral parietal peritoneum or liver.  The anastomosis rests 15 cm from the anal verge by rigid proctoscopy.  DESCRIPTION:   Informed consent was confirmed.  The patient underwent general anaesthesia without difficulty.  The patient was positioned appropriately.  VTE prevention in place.  The patient's abdomen was clipped, prepped, & draped in a sterile fashion.  Surgical timeout confirmed our plan.  The patient was positioned in reverse Trendelenburg.  Abdominal entry was gained using optical entry technique in the right upper abdomen.  Entry was clean.  I induced carbon dioxide insufflation.  Camera inspection revealed no injury.  Some mild omental adhesions were carefully freed off the left anterior abdominal wall  Extra ports were carefully placed under direct laparoscopic visualization.  I reflected the greater omentum and the upper abdomen the small bowel in the upper abdomen. The patient was positioned in Trendelenburg right side down.  The da Vinci SI robot was carefully docked on all four ports.  Instruments were inserted under direct visualization.  Proceed with robotic dissection.  I scored the base of  peritoneum of the right side of the mesentery of the left colon from the ligament of Treitz to the peritoneal reflection of the mid rectum.  I elevated the sigmoid mesentery and got into the retro-mesenteric  plane. We were able to identify the left ureter and gonadal vessels. We kept those posterior within the retroperitoneum and elevated the left colon mesentery off that. I did free off of mesentery to the IMA pedicle but did not ligate it yet.  I continued distally and got into the avascular plane posterior to the mesorectum. This allowed me to help mobilize the rectum as well by freeing the mesorectum off the sacrum.  I mobilized the peritoneal coverings towards the peritoneal reflection on both the right and left sides of the rectum.  I could see the right and left ureters and stayed away from them.  I kept the lateral vascular pedicles to the rectum intact.  I skeletonized the lymph nodes off the inferior mesenteric artery pedicle.  I went down to its takeoff from the aorta.  After confirming the left ureter was out of the way, I went ahead and ligated the inferior mesenteric artery pedicle with Plastic locking clips.  Because I had pretty good mobilization, I held off on ligating the inferior mesenteric vein.  We ensured hemostasis. I skeletonized the mesorectum at the junction at the proximal rectum using blunt dissection & bipolar energy. I mobilized the left colon in a lateral to medial fashion up towards the splenic flexure and down towards the left peritoneal reflection.  I mobilized the left colon some more to ensure good mobilization of the left colon to reach into the pelvis.  I chose an area in the mid descending colon not reached well down.  I took the mesentery using bipolar energy in a radial fashion up to this point.  I wound protector through a Pfannenstiel incision in the suprapubic region, taking care to avoid bladder injury. I transected bowel at the junction between the proximal and mid rectum using a contour stapler. I was able to eviscerate the rectosigmoid and descending colon out the wound.  I Found the region in the mid descending colon that had its mesentery transected more distally.   It was soft and easily reached down. I clamped the colon proximal to this area using a soft bowel clamp. I transected at the descending/sigmoid junction with a scalpel. I got healthy bleeding mucosa.  We sent the rectosigmoid colon specimen off to go to pathology.  We sized the colon orifice.  I chose a 29 EEA anvil stapler system. I placed the anvil to the open end of the descending colon and closed around it using a 0 Prolene pursestring.  We did copious irrigation with crystalloid solution.  Hemostasis was good.       Dr. Maisie Fus scrubbed down and did gentle anal dilation and advanced the EEA stapler up the rectal stump. The spike was brought out at the provimal end of the rectal stump under direct visualization.  I attached the anvil of the proximal colon the spike of the stapler. Anvil was tightened down and held clamped for 60 seconds. The EEA stapler was fired and held clamped for 30 seconds. The stapler was released & removed. We noted 2 excellent anastomotic rings. Blue stitch is in the proximal ring.  Dr. Maisie Fus did rigid proctoscopy noted the anastomosis was at 15 cm from the anal verge consistent with the proximal rectum.  We did a final irrigation of antibiotic solution (900  mg clindamycin/240 mg gentamicin in a liter of crystalloid) & held that for the pelvic air leak test .  The rectum was insufflated the rectum while clamping the colon proximal to that anastomosis.  There was a negative air leak test. There was no tension. Anastomosis & colon looked viable.    We changed gown and gloves.  We did diagnostic laparoscopy.  We aspirated the antibiotic irrigation.  Hemostasis was good.   Ureters & bowel uninjured.  The anastomosis looked healthy.  We evacuated carbon dioxide out through the ports.  We removed all ports and wound protectors.  We reached down and draped per colectomy protocol.   I placed On-Q catheter and sheaths into the preperitoneal space under direct palpation. I closed the 5mm port  sites using Monocryl stitch and sterile dressing.  I closed the Pfannenstiel wound using a 0 Vicryl vertical peritoneal closure and a #1 PDS transverse anterior rectal fascial closure. I closed the skin with some interrupted Monocryl stitches. I placed antibiotic-soaked wicks into the closure at the corners & centrally x4 between those areas. I placed a sterile dressing.  OnQ catheters placed & sheaths peeled away.  Patient is being extubated go to recovery room. I discussed postop care with the patient in detail the office & in the holding area. Instructions are written. I updated the patient's status to her husband.  Recommendations were made.  Questions were answered.  The family expressed understanding & appreciation.

## 2013-09-04 NOTE — H&P (View-Only) (Signed)
Subjective:     Patient ID: Pamela [name], female   DOB: 10-21-1946, 67 y.o.   MRN: [id number]  HPI  Pamela [name]  05/16/1946 [id number]  Patient Care Team: [name], MD as PCP - General (Internal Medicine) [name], MD as Consulting Physician (Gastroenterology) [name], MD as Consulting Physician (General Surgery)  This patient is a 67 y.o.female who presents today for surgical evaluation at the request of Dr. Juanda Chance.   Reason for visit: Recurrent sigmoid diverticulitis  Pleasant active woman.  She has had attacks of diverticulitis for over a decade.  Treated with oral antibiotics.  She has got them virtually every year.  She is proud prior colonoscopies.  Descending and sigmoid diverticulosis noted.  Some chronic inflammation and mild stricturing noted on CAT scan and endoscopy, especially with the most recent evaluation past two years.  She had another bout of diverticulitis this summer.  Required three courses of antibiotics to improve it.  Also, her husband had perforated diverticulitis that required an emergency Hartmann procedure with colostomy.  That concerned her.  She comes in today to consider surgery to resect the problem area.  She normally has about one every day.  She sticks to a high-fiber diet with a fiber supplement.  She has had mild hemorrhoid flares in the past but nothing recently.  No history of skin problems.  She did have a ectopic pregnancy that required surgery.  She had a tubal ligation.  It was reversed in [facility]  She is also had attempts at in vitro fertilization.  She got another ectopic pregnancy.  No history of bowel obstructions or fistulas.  Patient Active Problem List   Diagnosis Date Noted  . Diverticulitis of sigmoid colon - recurrent 08/14/2013  . Hypothyroidism 06/26/2011  . Osteopenia 06/26/2011  . Fibrocystic breast disease 06/26/2011  . Hyperlipidemia 06/26/2011  . Insomnia 06/26/2011    Past Medical History    Diagnosis Date  . Diverticulosis   . Hypertension   . Osteopenia   . Diverticulitis   . Hyperlipidemia   . Vaginal atrophy   . Vitamin D deficiency   . Fibrocystic breast disease   . Hypothyroidism   . Tubal ectopic pregnancy     x 3  . Colon polyps     Past Surgical History  Procedure Laterality Date  . Wrist fracture surgery Left 1987  . Tubal ligation  1980s  . Ectopic pregnancy surgery  1980s    x2  . Reversal tubal ligation  1980s  . Fertility surgery      x4 IVF    History   Social History  . Marital Status: Married    Spouse Name: N/A    Number of Children: 0  . Years of Education: N/A   Occupational History  . accountant    Social History Main Topics  . Smoking status: Never Smoker   . Smokeless tobacco: Never Used  . Alcohol Use: Yes     Comment: 1 per day  . Drug Use: No  . Sexual Activity: Not on file   Other Topics Concern  . Not on file   Social History Narrative  . No narrative on file    Family History  Problem Relation Age of Onset  . Colon cancer Paternal Uncle   . Cancer Paternal Uncle     Colon  . Bladder Cancer Paternal Uncle   . Cancer Paternal Chief Financial Officer  . Prostate cancer  Paternal Uncle   . Skin cancer Paternal Uncle   . Diabetes Father   . Heart disease Father   . Stroke Father   . Diabetes Other     father's side juvenile and adult onset  . Hypertension Mother   . Cancer Mother     Lung  . Cancer Paternal Grandfather     Skin/Stomach    Current Outpatient Prescriptions  Medication Sig Dispense Refill  . levothyroxine (SYNTHROID, LEVOTHROID) 100 MCG tablet Take 1 tablet (100 mcg total) by mouth daily.  90 tablet  1  . psyllium (METAMUCIL) 58.6 % packet Take 1 packet by mouth daily.        . simvastatin (ZOCOR) 10 MG tablet Take 1 tablet (10 mg total) by mouth at bedtime.  90 tablet  1  . zolpidem (AMBIEN) 5 MG tablet Take 1 tablet (5 mg total) by mouth at bedtime as needed.  30 tablet  1  .  metroNIDAZOLE (FLAGYL) 500 MG tablet Take 1 tablet (500 mg total) by mouth as directed. Take 2 pills (=1000mg ) by mouth at 1pm, 3pm, and 10pm the day before your colorectal operation as discussed in [facility] office.  Call [phone] with questions  6 tablet  0  . neomycin (MYCIFRADIN) 500 MG tablet Take 2 tablets (1,000 mg total) by mouth as directed. Take 2 pills (=1000mg ) by mouth at 1pm, 3pm, and 10pm the day before your colorectal operation as discussed in [facility] office.  Call [phone] with questions  6 tablet  0   No current facility-administered medications for this visit.     Allergies  Allergen Reactions  . Boniva [Ibandronate Sodium]     GI  . Morphine And Related Nausea And Vomiting  . Penicillins Hives  . Succinylcholine     BP 136/90  Pulse 58  Temp(Src) 96.6 F (35.9 C) (Temporal)  Ht 5\' 2"  (1.575 m)  Wt 126 lb 12.8 oz (57.516 kg)  BMI 23.19 kg/m2  SpO2 98%  Ct Abdomen Pelvis W Contrast  07/18/2013   *RADIOLOGY REPORT*  Clinical Data: Left sided abdominal pain. Diverticulitis.  CT ABDOMEN AND PELVIS WITH CONTRAST  Technique:  Multidetector CT imaging of the abdomen and pelvis was performed following the standard protocol during bolus administration of intravenous contrast.  Contrast: OMNIPAQUE IOHEXOL 300 MG/ML  SOLN  Comparison: None.  Findings: A tiny sub-centimeter cyst is noted the left hepatic lobe and other tiny sub-centimeter cysts are noted within the kidneys. No masses are seen involving the liver, kidneys, pancreas, adrenal glands, or spleen.  No evidence of hydronephrosis.  Uterus and adnexal regions are unremarkable.  Diverticulosis is seen as well as mild wall thickening involving the proximal sigmoid colon.  This is consistent with mild diverticulitis.  There is no evidence of abscess or free fluid. No evidence of bowel obstruction.  IMPRESSION:  Mild sigmoid diverticulitis.  No evidence of abscess or other complication.   Original Report Authenticated By:  Myles Rosenthal, M.D.   Dg Colon Ramond Craver Hi Den Cm  08/09/2013   CLINICAL DATA:  History of diverticulitis. Possible candidate for colonic resection.  EXAM: AIR CONTRAST BARIUM ENEMA  TECHNIQUE: Initial scout AP supine abdominal image obtained to insure adequate colon cleansing. Barium was introduced into the colon in a retrograde fashion and refluxed from the rectum to the distal transverse colon. As much of the barium as possible was then removed through the indwelling tube via gravity drain. Air was then insufflated into the colon. Spot  images of the colon followed by overhead radiographs were obtained.  COMPARISON:  No priors.  FLUOROSCOPY TIME:  7 min and 30 seconds.  FINDINGS: Extensive diverticulosis is noted, predominantly in the distal descending colon and sigmoid colon, where there appears to be some mild narrowing of the colonic lumen, presumably related to chronic inflammation and mild scarring. No definite colonic polyps or colonic mass identified on today's examination.  IMPRESSION: Extensive colonic diverticulosis predominantly involving the descending colon and sigmoid colon.   Electronically Signed   By: Trudie Reed M.D.   On: 08/09/2013 10:35     Review of Systems  Constitutional: Negative for fever, chills, diaphoresis, appetite change and fatigue.  HENT: Negative for ear pain, sore throat, trouble swallowing, neck pain and ear discharge.   Eyes: Negative for photophobia, discharge and visual disturbance.  Respiratory: Negative for cough, choking, chest tightness and shortness of breath.   Cardiovascular: Negative for chest pain and palpitations.        No exertional chest/neck/shoulder/arm pain.  Patient can walk 5 miles without difficulty.    Gastrointestinal: Positive for abdominal pain and abdominal distention. Negative for nausea, vomiting, diarrhea, constipation, blood in stool, anal bleeding and rectal pain.       No personal nor family history of GI/colon cancer, inflammatory  bowel disease, irritable bowel syndrome, allergy such as Celiac Sprue, dietary/dairy problems, colitis, ulcers nor gastritis.  No recent sick contacts/gastroenteritis.  No travel outside the country.  No changes in diet.    Endocrine: Negative for cold intolerance and heat intolerance.  Genitourinary: Negative for dysuria, frequency and difficulty urinating.  Musculoskeletal: Negative for myalgias and gait problem.  Skin: Negative for color change, pallor and rash.  Allergic/Immunologic: Negative for environmental allergies, food allergies and immunocompromised state.  Neurological: Negative for dizziness, speech difficulty, weakness and numbness.  Hematological: Negative for adenopathy.  Psychiatric/Behavioral: Negative for confusion and agitation. The patient is not nervous/anxious.        Objective:   Physical Exam  Constitutional: She is oriented to person, place, and time. She appears well-developed and well-nourished. No distress.  HENT:  Head: Normocephalic.  Mouth/Throat: Oropharynx is clear and moist. No oropharyngeal exudate.  Eyes: Conjunctivae and EOM are normal. Pupils are equal, round, and reactive to light. No scleral icterus.  Neck: Normal range of motion. Neck supple. No tracheal deviation present.  Cardiovascular: Normal rate, regular rhythm and intact distal pulses.   Pulmonary/Chest: Effort normal and breath sounds normal. No stridor. No respiratory distress. She exhibits no tenderness.  Abdominal: Soft. She exhibits no distension and no mass. There is no tenderness. There is no rigidity, no rebound, no guarding, no CVA tenderness, no tenderness at McBurney's point and negative Murphy's sign. No hernia. Hernia confirmed negative in the right inguinal area and confirmed negative in the left inguinal area.    Genitourinary: No vaginal discharge found.  Musculoskeletal: Normal range of motion. She exhibits no tenderness.       Right elbow: She exhibits normal range of  motion.       Left elbow: She exhibits normal range of motion.       Right wrist: She exhibits normal range of motion.       Left wrist: She exhibits normal range of motion.       Right hand: Normal strength noted.       Left hand: Normal strength noted.  Lymphadenopathy:       Head (right side): No posterior auricular adenopathy present.  Head (left side): No posterior auricular adenopathy present.    She has no cervical adenopathy.    She has no axillary adenopathy.       Right: No inguinal adenopathy present.       Left: No inguinal adenopathy present.  Neurological: She is alert and oriented to person, place, and time. No cranial nerve deficit. She exhibits normal muscle tone. Coordination normal.  Skin: Skin is warm and dry. No rash noted. She is not diaphoretic. No erythema.  Psychiatric: She has a normal mood and affect. Her behavior is normal. Judgment and thought content normal.       Assessment:     Recurrent bouts of diverticulitis with at least 15 attacks, three this summer.  Confirmed by enema, colonoscopy, CT scan.     Plan:     I think she is past the point of requiring surgery.  I think she would benefit from it.  She agrees.  Despite her numerous pelvic surgeries, she is a reasonable candidate to consider laparoscopic or robotic approach.  I discussed with her how we are starting in robotic colorectal program.  She is interested in being one of the first cases.  I explained that we are having a proctor and extra training in the process.  We will see if that can be arranged in the next few weeks:  The anatomy & physiology of the digestive tract was discussed.  The pathophysiology was discussed.  Natural history risks without surgery was discussed.   I feel the risks of no intervention will lead to serious problems that outweigh the operative risks; therefore, I recommended a partial colectomy to remove the pathology.  Laparoscopic, robotic & open techniques were  discussed.   Risks such as bleeding, infection, abscess, leak, reoperation, possible ostomy, hernia, heart attack, death, and other risks were discussed.  I noted a good likelihood this will help address the problem.   Goals of post-operative recovery were discussed as well.  We will work to minimize complications.  An educational handout on the pathology was given as well.  Questions were answered.  The patient expresses understanding & wishes to proceed with surgery.

## 2013-09-04 NOTE — Progress Notes (Signed)
Blood pressure now 163/86

## 2013-09-04 NOTE — Progress Notes (Signed)
Dr. Renold Don made aware of patient's elevated blood pressures- orders given- Apresoline 5 mg IVP given as ordered.

## 2013-09-04 NOTE — Transfer of Care (Signed)
Immediate Anesthesia Transfer of Care Note  Patient: Pamela [name]  Procedure(s) Performed: Procedure(s) (LRB): ROBOT ASSISTED SIGMOID COLECTOMY AND RIGID PROCTOSCOPY (N/A)  Patient Location: PACU  Anesthesia Type: General  Level of Consciousness: sedated, patient cooperative and responds to stimulation  Airway & Oxygen Therapy: Patient Spontanous Breathing and Patient connected to face mask oxgen  Post-op Assessment: Report given to PACU RN and Post -op Vital signs reviewed and stable  Post vital signs: Reviewed and stable  Complications: No apparent anesthesia complications

## 2013-09-04 NOTE — Anesthesia Preprocedure Evaluation (Signed)
Anesthesia Evaluation  Patient identified by MRN, date of birth, ID band Patient awake    Reviewed: Allergy & Precautions, H&P , NPO status , Patient's Chart, lab work & pertinent test results  Airway Mallampati: II TM Distance: >3 FB Neck ROM: Full    Dental no notable dental hx.    Pulmonary neg pulmonary ROS,  breath sounds clear to auscultation  Pulmonary exam normal       Cardiovascular Rhythm:Regular Rate:Normal     Neuro/Psych negative neurological ROS  negative psych ROS   GI/Hepatic negative GI ROS, Neg liver ROS,   Endo/Other  Hypothyroidism   Renal/GU negative Renal ROS  negative genitourinary   Musculoskeletal negative musculoskeletal ROS (+)   Abdominal   Peds negative pediatric ROS (+)  Hematology negative hematology ROS (+)   Anesthesia Other Findings   Reproductive/Obstetrics negative OB ROS                          Anesthesia Physical Anesthesia Plan  ASA: II  Anesthesia Plan: General   Post-op Pain Management:    Induction: Intravenous  Airway Management Planned: Oral ETT  Additional Equipment:   Intra-op Plan:   Post-operative Plan: Extubation in OR  Informed Consent: I have reviewed the patients History and Physical, chart, labs and discussed the procedure including the risks, benefits and alternatives for the proposed anesthesia with the patient or authorized representative who has indicated his/her understanding and acceptance.   Dental advisory given  Plan Discussed with: CRNA and Surgeon  Anesthesia Plan Comments:         Anesthesia Quick Evaluation  

## 2013-09-04 NOTE — Interval H&P Note (Signed)
History and Physical Interval Note:  09/04/2013 12:55 PM  Pamela [name]  has presented today for surgery, with the diagnosis of Recurrent sigmoid diverticulitis   The various methods of treatment have been discussed with the patient and family. After consideration of risks, benefits and other options for treatment, the patient has consented to  Procedure(s): ROBOT ASSISTED SIGMOID COLECTOMY AND RIGID PROCTOSCOPY (N/A) as a surgical intervention .  The patient's history has been reviewed, patient examined, no change in status, stable for surgery.  I have reviewed the patient's chart and labs.  Questions were answered to the patient's satisfaction.     [name] C.

## 2013-09-04 NOTE — Anesthesia Postprocedure Evaluation (Signed)
Anesthesia Post Note  Patient: Pamela [name]  Procedure(s) Performed: Procedure(s) (LRB): ROBOT ASSISTED SIGMOID COLECTOMY AND RIGID PROCTOSCOPY (N/A)  Anesthesia type: General  Patient location: PACU  Post pain: Pain level controlled  Post assessment: Post-op Vital signs reviewed  Last Vitals: BP 158/77  Pulse 77  Temp(Src) 36.3 C (Oral)  Resp 16  SpO2 100%  Post vital signs: Reviewed  Level of consciousness: sedated  Complications: No apparent anesthesia complications

## 2013-09-05 DIAGNOSIS — I10 Essential (primary) hypertension: Secondary | ICD-10-CM

## 2013-09-05 LAB — BASIC METABOLIC PANEL
BUN: 8 mg/dL (ref 6–23)
CO2: 24 mEq/L (ref 19–32)
Calcium: 8.5 mg/dL (ref 8.4–10.5)
Chloride: 102 mEq/L (ref 96–112)
Creatinine, Ser: 0.64 mg/dL (ref 0.50–1.10)
GFR calc Af Amer: >90 mL/min (ref >90)
GFR calc non Af Amer: >90 mL/min (ref >90)
Sodium: 135 mEq/L (ref 135–145)

## 2013-09-05 LAB — CBC
HCT: 35.3 % — ABNORMAL LOW (ref 36.0–46.0)
MCHC: 34.3 g/dL (ref 30.0–36.0)
MCV: 86.3 fL (ref 78.0–100.0)
Platelets: 233 10*3/uL (ref 150–400)
RDW: 12.8 % (ref 11.5–15.5)
WBC: 9.7 10*3/uL (ref 4.0–10.5)

## 2013-09-05 MED ORDER — FAMOTIDINE 20 MG PO TABS
20.0000 mg | ORAL_TABLET | Freq: Two times a day (BID) | ORAL | Status: DC
  Administered 2013-09-05 – 2013-09-07 (×5): 20 mg via ORAL
  Filled 2013-09-05 (×6): qty 1

## 2013-09-05 MED ORDER — METOPROLOL TARTRATE 12.5 MG HALF TABLET
12.5000 mg | ORAL_TABLET | Freq: Two times a day (BID) | ORAL | Status: DC
  Administered 2013-09-05 (×2): 12.5 mg via ORAL
  Filled 2013-09-05 (×4): qty 1

## 2013-09-05 NOTE — Care Management Note (Signed)
    Page 1 of 1   09/05/2013     10:37:06 AM   CARE MANAGEMENT NOTE 09/05/2013  Patient:  Pamela [name],Pamela [name]   Account Number:  [id number]  Date Initiated:  09/05/2013  Documentation initiated by:  Lorenda Ishihara  Subjective/Objective Assessment:   67 yo female admitted [name]/p robotic sigmoid colectomy. PTA lived at home with spouse.     Action/Plan:   Home when stable   Anticipated DC Date:  09/08/2013   Anticipated DC Plan:  HOME/SELF CARE      DC Planning Services  CM consult      Choice offered to / List presented to:             Status of service:  Completed, signed off Medicare Important Message given?   (If response is "NO", the following Medicare IM given date fields will be blank) Date Medicare IM given:   Date Additional Medicare IM given:    Discharge Disposition:  HOME/SELF CARE  Per UR Regulation:  Reviewed for med. necessity/level of care/duration of stay  If discussed at Long Length of Stay Meetings, dates discussed:    Comments:

## 2013-09-05 NOTE — Progress Notes (Addendum)
Pamela [name] [id number] 01-04-1946  CARE TEAM:  PCP: [name], MD  Outpatient Care Team: Patient Care Team: [name], MD as PCP - General (Internal Medicine) [name], MD as Consulting Physician (Gastroenterology) [name], MD as Consulting Physician (General Surgery)  Inpatient Treatment Team: Treatment Team: Attending Provider: [name], MD; Registered Nurse: Toney Sang, RN; Registered Nurse: Tristan Schroeder, RN; Technician: Zenovia Jarred, NT; Registered Nurse: Bethann Goo, RN   Subjective:  The patient denies pain.  Struggled with nausea especially last night.  Had one episode of emesis.  Feels some reflux especially while supine.  Not getting out much yet.  She is hoping to get up and walk this morning  Objective:  Vital signs:  Filed Vitals:   09/04/13 2239 09/05/13 0200 09/05/13 0500 09/05/13 0537  BP: 151/80 148/69  151/78  Pulse: 78 74  74  Temp: 98.3 F (36.8 C) 98.3 F (36.8 C)  98.7 F (37.1 C)  TempSrc: Oral Oral  Oral  Resp: 19 18  16   Height:   5\' 2"  (1.575 m)   Weight:   126 lb (57.153 kg) 126 lb 11.2 oz (57.471 kg)  SpO2: 100% 98%  97%       Intake/Output   Yesterday:  10/07 0701 - 10/08 0700 In: 3016.7 [P.O.:240; I.V.:2776.7] Out: 1450 [Urine:1250; Emesis/NG output:150; Blood:50] This shift:     Bowel function:  Flatus: n  BM: n  Drain: n/a  Physical Exam:  General: Pt awake/alert/oriented x4 in no acute distress Eyes: PERRL, normal EOM.  Sclera clear.  No icterus Neuro: CN II-XII intact w/o focal sensory/motor deficits. Lymph: No head/neck/groin lymphadenopathy Psych:  No delerium/psychosis/paranoia HENT: Normocephalic, Mucus membranes moist.  No thrush Neck: Supple, No tracheal deviation Chest: No chest wall pain w good excursion CV:  Pulses intact.  Regular rhythm MS: Normal AROM mjr joints.  No obvious deformity Abdomen: Soft.  Nondistended.  Nontender at Pfannenstiel  incision.  Moderate ecchymosis on the left side but not severe.  Dressing clean with no leak.  On-Q in place.  No evidence of peritonitis.  No incarcerated hernias. Ext:  SCDs BLE.  No mjr edema.  No cyanosis Skin: No petechiae / purpura   Problem List:   Principal Problem:   Diverticulitis of sigmoid colon - recurrent Active Problems:   Hypothyroidism   Hypertension   Assessment  Pamela [name]  67 y.o. female  1 Day Post-Op  Procedure(s): ROBOT ASSISTED SIGMOID COLECTOMY AND RIGID PROCTOSCOPY  Stable with some mild nausea/reflux  Plan:  H2 blocker for reflux.  Retry clears when nausea less.  Followup the pathology.  Hypertension.  Start scheduled metoprolol and follow  -VTE prophylaxis- SCDs, etc  -mobilize as tolerated to help recovery.  She needs to get up and walk  I updated the patient's status to the patient.  Recommendations were made.  Questions were answered.  The patient expressed understanding & appreciation.   [name], M.D., F.A.C.S. Gastrointestinal and Minimally Invasive Surgery [facility] [address] [place], [place] [address] [phone] Main / Paging   09/05/2013   Results:   Labs: Results for orders placed during the hospital encounter of 09/04/13 (from the past 48 hour(s))  CBC     Status: Abnormal   Collection Time    09/04/13  4:14 PM      Result Value Range   WBC 11.1 (*) 4.0 - 10.5 K/uL  RBC 4.34  3.87 - 5.11 MIL/uL   Hemoglobin 13.0  12.0 - 15.0 g/dL   HCT 95.2  84.1 - 32.4 %   MCV 86.4  78.0 - 100.0 fL   MCH 30.0  26.0 - 34.0 pg   MCHC 34.7  30.0 - 36.0 g/dL   RDW 40.1  02.7 - 25.3 %   Platelets 226  150 - 400 K/uL  CREATININE, SERUM     Status: None   Collection Time    09/04/13  4:14 PM      Result Value Range   Creatinine, Ser 0.64  0.50 - 1.10 mg/dL   GFR calc non Af Amer >90  >90 mL/min   GFR calc Af Amer >90  >90 mL/min   Comment: (NOTE)     The eGFR has been  calculated using the CKD EPI equation.     This calculation has not been validated in all clinical situations.     eGFR's persistently <90 mL/min signify possible Chronic Kidney     Disease.  BASIC METABOLIC PANEL     Status: Abnormal   Collection Time    09/05/13  4:59 AM      Result Value Range   Sodium 135  135 - 145 mEq/L   Potassium 3.9  3.5 - 5.1 mEq/L   Chloride 102  96 - 112 mEq/L   CO2 24  19 - 32 mEq/L   Glucose, Bld 147 (*) 70 - 99 mg/dL   BUN 8  6 - 23 mg/dL   Creatinine, Ser 6.64  0.50 - 1.10 mg/dL   Calcium 8.5  8.4 - 40.3 mg/dL   GFR calc non Af Amer >90  >90 mL/min   GFR calc Af Amer >90  >90 mL/min   Comment: (NOTE)     The eGFR has been calculated using the CKD EPI equation.     This calculation has not been validated in all clinical situations.     eGFR's persistently <90 mL/min signify possible Chronic Kidney     Disease.  CBC     Status: Abnormal   Collection Time    09/05/13  4:59 AM      Result Value Range   WBC 9.7  4.0 - 10.5 K/uL   RBC 4.09  3.87 - 5.11 MIL/uL   Hemoglobin 12.1  12.0 - 15.0 g/dL   HCT 47.4 (*) 25.9 - 56.3 %   MCV 86.3  78.0 - 100.0 fL   MCH 29.6  26.0 - 34.0 pg   MCHC 34.3  30.0 - 36.0 g/dL   RDW 87.5  64.3 - 32.9 %   Platelets 233  150 - 400 K/uL  MAGNESIUM     Status: None   Collection Time    09/05/13  4:59 AM      Result Value Range   Magnesium 1.7  1.5 - 2.5 mg/dL    Imaging / Studies: No results found.  Medications / Allergies: per chart  Antibiotics: Anti-infectives   Start     Dose/Rate Route Frequency Ordered Stop   09/04/13 1630  clindamycin (CLEOCIN) IVPB 600 mg     600 mg 100 mL/hr over 30 Minutes Intravenous 4 times per day 09/04/13 1520     09/04/13 1519  neomycin (MYCIFRADIN) tablet 1,000 mg  Status:  Discontinued     1,000 mg Oral As directed 09/04/13 1520 09/04/13 1527   09/04/13 0645  clindamycin (CLEOCIN) IVPB 900 mg     900 mg 100  mL/hr over 30 Minutes Intravenous On call to O.R. 09/04/13 0631  09/04/13 0825   09/04/13 0645  gentamicin (GARAMYCIN) 300 mg in dextrose 5 % 100 mL IVPB     300 mg 107.5 mL/hr over 60 Minutes Intravenous On call to O.R. 09/04/13 0631 09/04/13 0918   09/04/13 0645  clindamycin (CLEOCIN) 900 mg, gentamicin (GARAMYCIN) 240 mg in sodium chloride 0.9 % 1,000 mL for intraperitoneal lavage  Status:  Discontinued    Comments:  Pharmacy may adjust dosing strength, schedule, rate of infusion, etc as needed to optimize therapy    Intraperitoneal To Surgery 09/04/13 0631 09/04/13 1422

## 2013-09-06 MED ORDER — SODIUM CHLORIDE 0.9 % IJ SOLN
3.0000 mL | Freq: Two times a day (BID) | INTRAMUSCULAR | Status: DC
  Administered 2013-09-06: 3 mL via INTRAVENOUS

## 2013-09-06 MED ORDER — METOPROLOL TARTRATE 12.5 MG HALF TABLET
12.5000 mg | ORAL_TABLET | Freq: Every day | ORAL | Status: DC
  Administered 2013-09-06 – 2013-09-07 (×2): 12.5 mg via ORAL
  Filled 2013-09-06 (×2): qty 1

## 2013-09-06 MED ORDER — OXYCODONE HCL 5 MG PO TABS
5.0000 mg | ORAL_TABLET | ORAL | Status: DC | PRN
  Administered 2013-09-06 – 2013-09-07 (×5): 5 mg via ORAL
  Filled 2013-09-06 (×2): qty 1
  Filled 2013-09-06: qty 2
  Filled 2013-09-06 (×2): qty 1

## 2013-09-06 MED ORDER — LACTATED RINGERS IV BOLUS (SEPSIS): 1000.0000 mL | Freq: Three times a day (TID) | INTRAVENOUS | Status: DC | PRN

## 2013-09-06 MED ORDER — SODIUM CHLORIDE 0.9 % IJ SOLN: 3.0000 mL | INTRAMUSCULAR | Status: DC | PRN

## 2013-09-06 MED ORDER — HYDROMORPHONE HCL PF 1 MG/ML IJ SOLN: 0.5000 mg | INTRAMUSCULAR | Status: DC | PRN

## 2013-09-06 NOTE — Progress Notes (Signed)
Pamela [name] [id number] Oct 30, 1946  CARE TEAM:  PCP: [name], MD  Outpatient Care Team: Patient Care Team: [name], MD as PCP - General (Internal Medicine) [name], MD as Consulting Physician (Gastroenterology) [name], MD as Consulting Physician (General Surgery)  Inpatient Treatment Team: Treatment Team: Attending Provider: [name], MD; Registered Nurse: Tristan Schroeder, RN; Technician: Zenovia Jarred, NT; Registered Nurse: Bethann Goo, RN   Subjective:  "I feel so much better!"  Struggled with nausea x1 last night, then had flatus   Objective:  Vital signs:  Filed Vitals:   09/05/13 1827 09/05/13 2106 09/06/13 0158 09/06/13 0600  BP: 152/79 106/60 107/81 98/62  Pulse: 70 55 67 62  Temp: 98.4 F (36.9 C) 98.6 F (37 C) 98.9 F (37.2 C) 98 F (36.7 C)  TempSrc: Oral Oral Oral Oral  Resp: 18 16 16 16   Height:      Weight:      SpO2: 99% 99% 98% 93%    Last BM Date:  (PTA)  Intake/Output   Yesterday:  10/08 0701 - 10/09 0700 In: 2131.7 [P.O.:880; I.V.:1251.7] Out: 2350 [Urine:2350] This shift:  Total I/O In: 862.5 [P.O.:240; I.V.:622.5] Out: 950 [Urine:950]  Bowel function:  Flatus: yes - a little  BM: n  Drain: n/a  Physical Exam:  General: Pt awake/alert/oriented x4 in no acute distress Eyes: PERRL, normal EOM.  Sclera clear.  No icterus Neuro: CN II-XII intact w/o focal sensory/motor deficits. Lymph: No head/neck/groin lymphadenopathy Psych:  No delerium/psychosis/paranoia HENT: Normocephalic, Mucus membranes moist.  No thrush Neck: Supple, No tracheal deviation Chest: No chest wall pain w good excursion CV:  Pulses intact.  Regular rhythm MS: Normal AROM mjr joints.  No obvious deformity Abdomen: Soft.  Mildly pouchy/distended.  Nontender at Pfannenstiel incision.  Moderate ecchymosis on the left side but not severe.  Dressing clean with no leak.  On-Q in place.  No evidence of peritonitis.   No incarcerated hernias. Ext:  SCDs BLE.  No mjr edema.  No cyanosis Skin: No petechiae / purpura   Problem List:   Principal Problem:   Diverticulitis of sigmoid colon - recurrent Active Problems:   Hypothyroidism   Hypertension   Assessment  Pamela [name]  67 y.o. female  2 Days Post-Op  Procedure(s): ROBOT ASSISTED SIGMOID COLECTOMY AND RIGID PROCTOSCOPY  Stable with some mild nausea/reflux  Plan:  H2 blocker for reflux.  Adv diet gradually.  Stop IVF  Followup the pathology.  Hypertension.  Lower scheduled metoprolol and follow  -VTE prophylaxis- SCDs, etc  -mobilize as tolerated to help recovery.  She needs to get up and walk  I updated the patient's status to the patient.  Recommendations were made.  Questions were answered.  The patient expressed understanding & appreciation.   [name], M.D., F.A.C.S. Gastrointestinal and Minimally Invasive Surgery [facility] [address] [place], [place] [address] [phone] Main / Paging   09/06/2013   Results:   Labs: Results for orders placed during the hospital encounter of 09/04/13 (from the past 48 hour(s))  CBC     Status: Abnormal   Collection Time    09/04/13  4:14 PM      Result Value Range   WBC 11.1 (*) 4.0 - 10.5 K/uL   RBC 4.34  3.87 - 5.11 MIL/uL   Hemoglobin 13.0  12.0 - 15.0 g/dL   HCT 29.5  28.4 - 13.2 %  MCV 86.4  78.0 - 100.0 fL   MCH 30.0  26.0 - 34.0 pg   MCHC 34.7  30.0 - 36.0 g/dL   RDW 16.1  09.6 - 04.5 %   Platelets 226  150 - 400 K/uL  CREATININE, SERUM     Status: None   Collection Time    09/04/13  4:14 PM      Result Value Range   Creatinine, Ser 0.64  0.50 - 1.10 mg/dL   GFR calc non Af Amer >90  >90 mL/min   GFR calc Af Amer >90  >90 mL/min   Comment: (NOTE)     The eGFR has been calculated using the CKD EPI equation.     This calculation has not been validated in all clinical situations.     eGFR's persistently <90  mL/min signify possible Chronic Kidney     Disease.  BASIC METABOLIC PANEL     Status: Abnormal   Collection Time    09/05/13  4:59 AM      Result Value Range   Sodium 135  135 - 145 mEq/L   Potassium 3.9  3.5 - 5.1 mEq/L   Chloride 102  96 - 112 mEq/L   CO2 24  19 - 32 mEq/L   Glucose, Bld 147 (*) 70 - 99 mg/dL   BUN 8  6 - 23 mg/dL   Creatinine, Ser 4.09  0.50 - 1.10 mg/dL   Calcium 8.5  8.4 - 81.1 mg/dL   GFR calc non Af Amer >90  >90 mL/min   GFR calc Af Amer >90  >90 mL/min   Comment: (NOTE)     The eGFR has been calculated using the CKD EPI equation.     This calculation has not been validated in all clinical situations.     eGFR's persistently <90 mL/min signify possible Chronic Kidney     Disease.  CBC     Status: Abnormal   Collection Time    09/05/13  4:59 AM      Result Value Range   WBC 9.7  4.0 - 10.5 K/uL   RBC 4.09  3.87 - 5.11 MIL/uL   Hemoglobin 12.1  12.0 - 15.0 g/dL   HCT 91.4 (*) 78.2 - 95.6 %   MCV 86.3  78.0 - 100.0 fL   MCH 29.6  26.0 - 34.0 pg   MCHC 34.3  30.0 - 36.0 g/dL   RDW 21.3  08.6 - 57.8 %   Platelets 233  150 - 400 K/uL  MAGNESIUM     Status: None   Collection Time    09/05/13  4:59 AM      Result Value Range   Magnesium 1.7  1.5 - 2.5 mg/dL    Imaging / Studies: No results found.  Medications / Allergies: per chart  Antibiotics: Anti-infectives   Start     Dose/Rate Route Frequency Ordered Stop   09/04/13 1630  clindamycin (CLEOCIN) IVPB 600 mg  Status:  Discontinued     600 mg 100 mL/hr over 30 Minutes Intravenous 4 times per day 09/04/13 1520 09/05/13 0932   09/04/13 1519  neomycin (MYCIFRADIN) tablet 1,000 mg  Status:  Discontinued     1,000 mg Oral As directed 09/04/13 1520 09/04/13 1527   09/04/13 0645  clindamycin (CLEOCIN) IVPB 900 mg     900 mg 100 mL/hr over 30 Minutes Intravenous On call to O.R. 09/04/13 0631 09/04/13 0825   09/04/13 0645  gentamicin (GARAMYCIN) 300 mg in  dextrose 5 % 100 mL IVPB     300 mg 107.5  mL/hr over 60 Minutes Intravenous On call to O.R. 09/04/13 0631 09/04/13 0918   09/04/13 0645  clindamycin (CLEOCIN) 900 mg, gentamicin (GARAMYCIN) 240 mg in sodium chloride 0.9 % 1,000 mL for intraperitoneal lavage  Status:  Discontinued    Comments:  Pharmacy may adjust dosing strength, schedule, rate of infusion, etc as needed to optimize therapy    Intraperitoneal To Surgery 09/04/13 0631 09/04/13 1422

## 2013-09-07 NOTE — Discharge Summary (Signed)
Physician Discharge Summary  Patient ID: Pamela [name] MRN: [id number] DOB/AGE: 1946-07-05 67 y.o.  Admit date: 09/04/2013 Discharge date: 09/07/2013  Admission Diagnoses:  Discharge Diagnoses:  Principal Problem:   Diverticulitis of sigmoid colon - recurrent Active Problems:   Hypothyroidism   Hypertension   Discharged Condition: good  Hospital Course: Patient with recurrent diverticulitis.  She underwent elective robotic colectomy.  Postoperatively, the patient mobilized in the hallways and advanced to a solid diet gradually.  Pain was well-controlled and transitioned off IV medications.    By the time of discharge, the patient was walking well the hallways, eating food well, having flatus.  Pain was-controlled on an oral regimen.  Based on meeting DC criteria and recovering well, I felt it was safe for the patient to be discharged home with close followup.  Instructions were discussed in detail.  They are written as well.     Consults: None  Significant Diagnostic Studies:   Treatments: surgery:   POST-OPERATIVE DIAGNOSIS: Recurrent sigmoid diverticulitis  PROCEDURE: Procedure(s):  ROBOT ASSISTED SIGMOID COLECTOMY  RIGID PROCTOSCOPY  SURGEON: Surgeon(s):  [name], MD  [name], MD - Asst      Discharge Exam: Blood pressure 161/92, pulse 61, temperature 97.8 F (36.6 C), temperature source Oral, resp. rate 18, height 5\' 2"  (1.575 m), weight 128 lb 12 oz (58.4 kg), SpO2 98.00%.  General: Pt awake/alert/oriented x4 in no major acute distress Eyes: PERRL, normal EOM. Sclera nonicteric Neuro: CN II-XII intact w/o focal sensory/motor deficits. Lymph: No head/neck/groin lymphadenopathy Psych:  No delerium/psychosis/paranoia HENT: Normocephalic, Mucus membranes moist.  No thrush Neck: Supple, No tracheal deviation Chest: No pain.  Good respiratory excursion. CV:  Pulses intact.  Regular rhythm MS: Normal AROM mjr joints.  No obvious deformity Abdomen:  Soft, Nondistended.  Nontender.  No incarcerated hernias.  Incisions clean.  Moderate ecchymosis of Pfannenstiel incision.  OnQ & wicks removed Ext:  SCDs BLE.  No significant edema.  No cyanosis Skin: No petechiae / purpura   Disposition:   Discharge Orders   Future Appointments Provider Department Dept Phone   02/19/2014 9:10 AM [name], MD Sharlet Salina, MD [phone]   02/21/2014 3:00 PM [name], MD Sharlet Salina, MD [phone]   Future Orders Complete By Expires   Call MD for:  extreme fatigue  As directed    Call MD for:  hives  As directed    Call MD for:  persistant nausea and vomiting  As directed    Call MD for:  redness, tenderness, or signs of infection (pain, swelling, redness, odor or green/yellow discharge around incision site)  As directed    Call MD for:  severe uncontrolled pain  As directed    Call MD for:  As directed    Comments:     Temperature > 101.77F   Diet - low sodium heart healthy  As directed    Discharge instructions  As directed    Comments:     Please see discharge instruction sheets.  Also refer to handout given an office.  Please call our office if you have any questions or concerns [phone]   Discharge wound care:  As directed    Comments:     If you have closed incisions, shower and bathe over these incisions with soap and water every day.  You do not need to replace dressings over the closed incisions unless you feel more comfortable with a Band-Aid covering it.  Please call our office [phone] if you have further questions.   Driving Restrictions  As directed    Comments:     No driving until off narcotics and can safely swerve away without pain during an emergency   Increase activity slowly  As directed    Comments:     Walk an hour a day.  Use 20-30 minute walks.  When you can walk 30 minutes without difficulty, increase to low impact/moderate activities such as biking, jogging, swimming, sexual activity..   Eventually can increase to unrestricted activity when not feeling pain.  If you feel pain: STOP!Marland Kitchen   Let pain protect you from overdoing it.  Use ice/heat/over-the-counter pain medications to help minimize his soreness.  Use pain prescriptions as needed to remain active.  It is better to take extra pain medications and be more active than to stay bedridden to avoid all pain medications.   Lifting restrictions  As directed    Comments:     Avoid heavy lifting initially.  Do not push through pain.  You have no specific weight limit.  Coughing and sneezing or four more stressful to your incision than any lifting you will do. Pain will protect you from injury.  Therefore, avoid intense activity until off all narcotic pain medications.  Coughing and sneezing or four more stressful to your incision than any lifting he will do.   May shower / Bathe  As directed    May walk up steps  As directed    Sexual Activity Restrictions  As directed    Comments:     Sexual activity as tolerated.  Do not push through pain.  Pain will protect you from injury.   Walk with assistance  As directed    Comments:     Walk over an hour a day.  May use a walker/cane/companion to help with balance and stamina.       Medication List         acetaminophen 500 MG tablet  Commonly known as:  TYLENOL  Take 1,000 mg by mouth every 6 (six) hours as needed for pain.     cholecalciferol 1000 UNITS tablet  Commonly known as:  VITAMIN D  Take 1,000 Units by mouth daily.     levothyroxine 100 MCG tablet  Commonly known as:  SYNTHROID, LEVOTHROID  Take 100 mcg by mouth every evening.     neomycin 500 MG tablet  Commonly known as:  MYCIFRADIN  Take 2 tablets (1,000 mg total) by mouth as directed. Take 2 pills (=1000mg ) by mouth at 1pm, 3pm, and 10pm the day before your colorectal operation as discussed in [facility] office.  Call [phone] with questions     oxyCODONE 5 MG immediate release tablet  Commonly known as:  Oxy  IR/ROXICODONE  Take 1-2 tablets (5-10 mg total) by mouth every 4 (four) hours as needed for pain.     psyllium 58.6 % packet  Commonly known as:  METAMUCIL  Take 1 packet by mouth daily.     simvastatin 10 MG tablet  Commonly known as:  ZOCOR  Take 1 tablet (10 mg total) by mouth at bedtime.     zolpidem 5 MG tablet  Commonly known as:  AMBIEN  Take 1 tablet (5 mg total) by mouth at bedtime as needed.           Follow-up Information   Follow up with [name]., MD. Schedule an appointment as soon as possible for a visit  in 2 weeks.   Specialty:  General Surgery   Contact information:   [address] [place] [address] [phone]       Signed: [name] 09/07/2013, 7:38 AM

## 2013-09-09 ENCOUNTER — Telehealth (INDEPENDENT_AMBULATORY_CARE_PROVIDER_SITE_OTHER): Payer: Self-pay | Admitting: General Surgery

## 2013-09-09 ENCOUNTER — Other Ambulatory Visit (INDEPENDENT_AMBULATORY_CARE_PROVIDER_SITE_OTHER): Payer: Self-pay | Admitting: General Surgery

## 2013-09-09 NOTE — Telephone Encounter (Signed)
She called saying that she was having a sore throat and whitish plaque on tongue and mouth.  We will try nystatin swish and spit.  I have called the prescription to her pharmacy to use for 48 hours after symptoms improve.  She is taking good PO and staying hydrated.  She said that she has a little distension but pain okay and taking good PO. I recommended that she come to ER for evaluation if distension increases or pain increases.

## 2013-09-12 ENCOUNTER — Telehealth (INDEPENDENT_AMBULATORY_CARE_PROVIDER_SITE_OTHER): Payer: Self-pay

## 2013-09-12 NOTE — Telephone Encounter (Signed)
Called to check on pt after her colon surgery with Dr Michaell Cowing. The pt is doing ok and she is feeling better each day. The pt is moving her bowels and is eating small amounts. The pt states that she is concerned about her bowels not being normal yet but they are soft each time. I advised her that her bowels are not going to be normal for about 3 months after surgery per Dr Michaell Cowing as long as she is having soft bowels movements then we are ok with this for now. We do not want the pt being constipated and the pt states that she is not constipated. The pt's mouth is better after being on the Magic Mouth wash for the thrush. The pt did get out of the house yesterday and walk 3 times during the day. I advised pt that we are going to keep her appt with Dr Michaell Cowing for 09/25/13 but if anything changes to please call our office. The pt understands.

## 2013-09-25 ENCOUNTER — Encounter (INDEPENDENT_AMBULATORY_CARE_PROVIDER_SITE_OTHER): Payer: Self-pay | Admitting: Surgery

## 2013-09-25 ENCOUNTER — Ambulatory Visit (INDEPENDENT_AMBULATORY_CARE_PROVIDER_SITE_OTHER): Payer: Commercial Indemnity | Admitting: Surgery

## 2013-09-25 VITALS — BP 142/86 | HR 64 | Resp 16 | Ht 62.0 in | Wt 123.4 lb

## 2013-09-25 DIAGNOSIS — K5732 Diverticulitis of large intestine without perforation or abscess without bleeding: Secondary | ICD-10-CM

## 2013-09-25 NOTE — Progress Notes (Signed)
Subjective:     Patient ID: Pamela [name], female   DOB: [birth year], 67 y.o.   MRN: [id number]  HPI  Pamela [name]  15-Oct-1946 [id number]  Patient Care Team: [name], MD as PCP - General (Internal Medicine) [name], MD as Consulting Physician (Gastroenterology) [name], MD as Consulting Physician (General Surgery)  This patient is a 67 y.o.female who presents today for surgical evaluation  POST-OPERATIVE DIAGNOSIS: Recurrent sigmoid diverticulitis   PROCEDURE: Procedure(s):  ROBOT ASSISTED SIGMOID COLECTOMY  RIGID PROCTOSCOPY  SURGEON: Surgeon(s):  [name], MD  [name], MD - Asst   FINAL DIAGNOSIS Diagnosis Colon, segmental resection, sigmoid - DIVERTICULOSIS. - TWO BENIGN LYMPH NODES, NEGATIVE FOR NEOPLASM - NO EVIDENCE OF ACTIVE INFLAMMATION OR NEOPLASM. - RESECTION MARGINS VIABLE. [name] MD Pathologist, Electronic Signature (Case signed 09/05/2013)  The patient comes in today with her husband.  She is doing well.  She is off all narcotics.  She is walking well.  A little soreness around her Pfannenstiel incision after a half hour walk.  Walking better than her husband according to him.  Healing well.  Appetite coming back.  Moving bowels daily.  No fevers chills or sweats.  No rectal bleeding.  Energy not back to normal, but getting there.  Wonders if she can get back to work since.  Hoping for next week.  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 Hypertension   . Diverticulitis of sigmoid colon s/p robotic colectomy 09/04/2013 08/14/2013  . Hypothyroidism 06/26/2011  . Osteopenia 06/26/2011  . Fibrocystic breast disease 06/26/2011  . Hyperlipidemia 06/26/2011  . Insomnia 06/26/2011    Past Medical History  Diagnosis Date  . Diverticulosis   . Osteopenia   . Diverticulitis   . Hyperlipidemia   . Vaginal atrophy   . Vitamin D deficiency   . Fibrocystic breast disease   .  Hypothyroidism   . Tubal ectopic pregnancy     x 3  . Colon polyps   . Hypertension     elevated bp in past, no current meds  . Complication of anesthesia 1980's    laparoscopic procedure invitro, bradycardia and heart stopped at [facility]    Past Surgical History  Procedure Laterality Date  . Wrist fracture surgery Left 1987  . Ectopic pregnancy surgery  1980s    x3  . Fertility surgery      x4 IVF  . Tubal repair  1980's  . Partial colectomy      History   Social History  . Marital Status: Married    Spouse Name: N/A    Number of Children: 0  . Years of Education: N/A   Occupational History  . accountant    Social History Main Topics  . Smoking status: Never Smoker   . Smokeless tobacco: Never Used  . Alcohol Use: Yes     Comment: 1 per day  . Drug Use: No  . Sexual Activity: Not on file   Other Topics Concern  . Not on file   Social History Narrative  . No narrative on file    Family History  Problem Relation Age of Onset  . Colon cancer Paternal Uncle   . Cancer Paternal Uncle     Colon  . Bladder Cancer Paternal Uncle   . Cancer Paternal Chief Financial Officer  . Prostate cancer Paternal Uncle   . Skin cancer Paternal Uncle   .  Diabetes Father   . Heart disease Father   . Stroke Father   . Diabetes Other     father's side juvenile and adult onset  . Hypertension Mother   . Cancer Mother     Lung  . Cancer Paternal Grandfather     Skin/Stomach    Current Outpatient Prescriptions  Medication Sig Dispense Refill  . cholecalciferol (VITAMIN D) 1000 UNITS tablet Take 1,000 Units by mouth daily.      Marland Kitchen levothyroxine (SYNTHROID, LEVOTHROID) 100 MCG tablet Take 100 mcg by mouth every evening.      . simvastatin (ZOCOR) 10 MG tablet Take 1 tablet (10 mg total) by mouth at bedtime.  90 tablet  1  . acetaminophen (TYLENOL) 500 MG tablet Take 1,000 mg by mouth every 6 (six) hours as needed for pain.      Marland Kitchen zolpidem (AMBIEN) 5 MG tablet Take 1 tablet  (5 mg total) by mouth at bedtime as needed.  30 tablet  1   No current facility-administered medications for this visit.     Allergies  Allergen Reactions  . Succinylcholine Other (See Comments)    BRADYCARDIA/  ASYSTOLE 1980's  . Boniva [Ibandronate Sodium]     GI  . Morphine And Related Nausea And Vomiting  . Penicillins Hives    BP 142/86  Pulse 64  Resp 16  Ht 5\' 2"  (1.575 m)  Wt 123 lb 6.4 oz (55.974 kg)  BMI 22.56 kg/m2  Dg Chest 2 View  08/29/2013   CLINICAL DATA:  Preop colectomy  EXAM: CHEST  2 VIEW  COMPARISON:  None.  FINDINGS: Heart size is upper normal. Left ventricular enlargement is noted. Negative for heart failure.  Negative for infiltrate effusion or mass. Lungs are clear.  IMPRESSION: No active cardiopulmonary abnormality.   Electronically Signed   By: Marlan Palau M.D.   On: 08/29/2013 09:12     Review of Systems  Constitutional: Negative for fever, chills and diaphoresis.  HENT: Negative for ear pain, sore throat and trouble swallowing.   Eyes: Negative for photophobia and visual disturbance.  Respiratory: Negative for cough and choking.   Cardiovascular: Negative for chest pain and palpitations.  Gastrointestinal: Negative for nausea, vomiting, abdominal pain, diarrhea, constipation, anal bleeding and rectal pain.  Genitourinary: Negative for dysuria, frequency and difficulty urinating.  Musculoskeletal: Negative for gait problem and myalgias.  Skin: Negative for color change, pallor and rash.  Neurological: Negative for dizziness, speech difficulty, weakness and numbness.  Hematological: Negative for adenopathy.  Psychiatric/Behavioral: Negative for confusion and agitation. The patient is not nervous/anxious.        Objective:   Physical Exam  Constitutional: She is oriented to person, place, and time. She appears well-developed and well-nourished. No distress.  HENT:  Head: Normocephalic.  Mouth/Throat: Oropharynx is clear and moist. No  oropharyngeal exudate.  Eyes: Conjunctivae and EOM are normal. Pupils are equal, round, and reactive to light. No scleral icterus.  Neck: Normal range of motion. No tracheal deviation present.  Cardiovascular: Normal rate and intact distal pulses.   Pulmonary/Chest: Effort normal. No respiratory distress. She exhibits no tenderness.  Abdominal: Soft. She exhibits no distension. There is no tenderness. Hernia confirmed negative in the right inguinal area and confirmed negative in the left inguinal area.  Incisions clean with normal healing ridges.  No hernias  Genitourinary: No vaginal discharge found.  Musculoskeletal: Normal range of motion. She exhibits no tenderness.  Lymphadenopathy:       Right: No inguinal  adenopathy present.       Left: No inguinal adenopathy present.  Neurological: She is alert and oriented to person, place, and time. No cranial nerve deficit. She exhibits normal muscle tone. Coordination normal.  Skin: Skin is warm and dry. No rash noted. She is not diaphoretic.  Psychiatric: She has a normal mood and affect. Her behavior is normal.       Assessment:     Recovering rather well three weeks status post robotic colectomy for sigmoid diverticulitis.     Plan:     I am glad she is recovered well.  Pathology consistent with diverticulosis.  Copy of pathology report given to husband.  I am hopeful she will continue to improve.  She declined any refill on her narcotics.  Increase activity as tolerated to regular activity.  Low impact exercise such as walking an hour a day at least ideal.  Do not push through pain.  Diet as tolerated.  Low fat high fiber diet ideal.  Bowel regimen with 30 g fiber a day and fiber supplement as needed to avoid problems.  I offered to see her in another month, but she is recovering quite well.  She felt comfortable with calling me if she needs me and just returning to clinic as needed.   Instructions discussed.  Followup with primary care  physician for other health issues as would normally be done.  Questions answered.  The patient expressed understanding and appreciation

## 2013-09-25 NOTE — Patient Instructions (Addendum)
GETTING TO GOOD BOWEL HEALTH. Irregular bowel habits such as constipation and diarrhea can lead to many problems over time.  Having one soft bowel movement a day is the most important way to prevent further problems.  The anorectal canal is designed to handle stretching and feces to safely manage our ability to get rid of solid waste (feces, poop, stool) out of our body.  BUT, hard constipated stools can act like ripping concrete bricks and diarrhea can be a burning fire to this very sensitive area of our body, causing inflamed hemorrhoids, anal fissures, increasing risk is perirectal abscesses, abdominal pain/bloating, an making irritable bowel worse.     The goal: ONE SOFT BOWEL MOVEMENT A DAY!  To have soft, regular bowel movements:    Drink at least 8 tall glasses of water a day.     Take plenty of fiber.  Fiber is the undigested part of plant food that passes into the colon, acting s "natures broom" to encourage bowel motility and movement.  Fiber can absorb and hold large amounts of water. This results in a larger, bulkier stool, which is soft and easier to pass. Work gradually over several weeks up to 6 servings a day of fiber (25g a day even more if needed) in the form of: o Vegetables -- Root (potatoes, carrots, turnips), leafy green (lettuce, salad greens, celery, spinach), or cooked high residue (cabbage, broccoli, etc) o Fruit -- Fresh (unpeeled skin & pulp), Dried (prunes, apricots, cherries, etc ),  or stewed ( applesauce)  o Whole grain breads, pasta, etc (whole wheat)  o Bran cereals    Bulking Agents -- This type of water-retaining fiber generally is easily obtained each day by one of the following:  o Psyllium bran -- The psyllium plant is remarkable because its ground seeds can retain so much water. This product is available as Metamucil, Konsyl, Effersyllium, Per Diem Fiber, or the less expensive generic preparation in drug and health food stores. Although labeled a laxative, it really  is not a laxative.  o Methylcellulose -- This is another fiber derived from wood which also retains water. It is available as Citrucel. o Polyethylene Glycol - and "artificial" fiber commonly called Miralax or Glycolax.  It is helpful for people with gassy or bloated feelings with regular fiber o Flax Seed - a less gassy fiber than psyllium   No reading or other relaxing activity while on the toilet. If bowel movements take longer than 5 minutes, you are too constipated   AVOID CONSTIPATION.  High fiber and water intake usually takes care of this.  Sometimes a laxative is needed to stimulate more frequent bowel movements, but    Laxatives are not a good long-term solution as it can wear the colon out. o Osmotics (Milk of Magnesia, Fleets phosphosoda, Magnesium citrate, MiraLax, GoLytely) are safer than  o Stimulants (Senokot, Castor Oil, Dulcolax, Ex Lax)    o Do not take laxatives for more than 7days in a row.    IF SEVERELY CONSTIPATED, try a Bowel Retraining Program: o Do not use laxatives.  o Eat a diet high in roughage, such as bran cereals and leafy vegetables.  o Drink six (6) ounces of prune or apricot juice each morning.  o Eat two (2) large servings of stewed fruit each day.  o Take one (1) heaping tablespoon of a psyllium-based bulking agent twice a day. Use sugar-free sweetener when possible to avoid excessive calories.  o Eat a normal breakfast.  o   Set aside 15 minutes after breakfast to sit on the toilet, but do not strain to have a bowel movement.  o If you do not have a bowel movement by the third day, use an enema and repeat the above steps.    Controlling diarrhea o Switch to liquids and simpler foods for a few days to avoid stressing your intestines further. o Avoid dairy products (especially milk & ice cream) for a short time.  The intestines often can lose the ability to digest lactose when stressed. o Avoid foods that cause gassiness or bloating.  Typical foods include  beans and other legumes, cabbage, broccoli, and dairy foods.  Every person has some sensitivity to other foods, so listen to our body and avoid those foods that trigger problems for you. o Adding fiber (Citrucel, Metamucil, psyllium, Miralax) gradually can help thicken stools by absorbing excess fluid and retrain the intestines to act more normally.  Slowly increase the dose over a few weeks.  Too much fiber too soon can backfire and cause cramping & bloating. o Probiotics (such as active yogurt, Align, etc) may help repopulate the intestines and colon with normal bacteria and calm down a sensitive digestive tract.  Most studies show it to be of mild help, though, and such products can be costly. o Medicines:   Bismuth subsalicylate (ex. Kayopectate, Pepto Bismol) every 30 minutes for up to 6 doses can help control diarrhea.  Avoid if pregnant.   Loperamide (Immodium) can slow down diarrhea.  Start with two tablets (4mg  total) first and then try one tablet every 6 hours.  Avoid if you are having fevers or severe pain.  If you are not better or start feeling worse, stop all medicines and call your doctor for advice o Call your doctor if you are getting worse or not better.  Sometimes further testing (cultures, endoscopy, X-ray studies, bloodwork, etc) may be needed to help diagnose and treat the cause of the diarrhea.   Diverticulosis Diverticulosis is a common condition that develops when small pouches (diverticula) form in the wall of the colon. The risk of diverticulosis increases with age. It happens more often in people who eat a low-fiber diet. Most individuals with diverticulosis have no symptoms. Those individuals with symptoms usually experience abdominal pain, constipation, or loose stools (diarrhea). HOME CARE INSTRUCTIONS   Increase the amount of fiber in your diet as directed by your caregiver or dietician. This may reduce symptoms of diverticulosis.  Your caregiver may recommend taking a  dietary fiber supplement.  Drink at least 6 to 8 glasses of water each day to prevent constipation.  Try not to strain when you have a bowel movement.  Your caregiver may recommend avoiding nuts and seeds to prevent complications, although this is still an uncertain benefit.  Only take over-the-counter or prescription medicines for pain, discomfort, or fever as directed by your caregiver. FOODS WITH HIGH FIBER CONTENT INCLUDE:  Fruits. Apple, peach, pear, tangerine, raisins, prunes.  Vegetables. Brussels sprouts, asparagus, broccoli, cabbage, carrot, cauliflower, romaine lettuce, spinach, summer squash, tomato, winter squash, zucchini.  Starchy Vegetables. Baked beans, kidney beans, lima beans, split peas, lentils, potatoes (with skin).  Grains. Whole wheat bread, brown rice, bran flake cereal, plain oatmeal, white rice, shredded wheat, bran muffins. SEEK IMMEDIATE MEDICAL CARE IF:   You develop increasing pain or severe bloating.  You have an oral temperature above 102 F (38.9 C), not controlled by medicine.  You develop vomiting or bowel movements that are bloody or  black. Document Released: 08/12/2004 Document Revised: 02/07/2012 Document Reviewed: 04/15/2010 [facility] Patient Information 2014 [place], [place].  ABDOMINAL SURGERY: POST OP INSTRUCTIONS  1. DIET: Follow a light bland diet the first 24 hours after arrival home, such as soup, liquids, crackers, etc.  Be sure to include lots of fluids daily.  Avoid fast food or heavy meals as your are more likely to get nauseated.  Eat a low fat the next few days after surgery.   2. Take your usually prescribed home medications unless otherwise directed. 3. PAIN CONTROL: a. Pain is best controlled by a usual combination of three different methods TOGETHER: i. Ice/Heat ii. Over the counter pain medication iii. Prescription pain medication b. Most patients will experience some swelling and bruising around the incisions.  Ice packs or  heating pads (30-60 minutes up to 6 times a day) will help. Use ice for the first few days to help decrease swelling and bruising, then switch to heat to help relax tight/sore spots and speed recovery.  Some people prefer to use ice alone, heat alone, alternating between ice & heat.  Experiment to what works for you.  Swelling and bruising can take several weeks to resolve.   c. It is helpful to take an over-the-counter pain medication regularly for the first few weeks.  Choose one of the following that works best for you: i. Naproxen (Aleve, etc)  Two 220mg  tabs twice a day ii. Ibuprofen (Advil, etc) Three 200mg  tabs four times a day (every meal & bedtime) iii. Acetaminophen (Tylenol, etc) 500-650mg  four times a day (every meal & bedtime) d. A  prescription for pain medication (such as oxycodone, hydrocodone, etc) should be given to you upon discharge.  Take your pain medication as prescribed.  i. If you are having problems/concerns with the prescription medicine (does not control pain, nausea, vomiting, rash, itching, etc), please call us [phone] to see if we need to switch you to a different pain medicine that will work better for you and/or control your side effect better. ii. If you need a refill on your pain medication, please contact your pharmacy.  They will contact our office to request authorization. Prescriptions will not be filled after 5 pm or on week-ends. 4. Avoid getting constipated.  Between the surgery and the pain medications, it is common to experience some constipation.  Increasing fluid intake and taking a fiber supplement (such as Metamucil, Citrucel, FiberCon, MiraLax, etc) 1-2 times a day regularly will usually help prevent this problem from occurring.  A mild laxative (prune juice, Milk of Magnesia, MiraLax, etc) should be taken according to package directions if there are no bowel movements after 48 hours.   5. Watch out for diarrhea.  If you have many loose bowel  movements, simplify your diet to bland foods & liquids for a few days.  Stop any stool softeners and decrease your fiber supplement.  Switching to mild anti-diarrheal medications (Kayopectate, Pepto Bismol) can help.  If this worsens or does not improve, please call us. 6. Wash / shower every day.  You may shower over the incision / wound.  Avoid baths until the skin is fully healed.  Continue to shower over incision(s) after the dressing is off. 7. Remove your waterproof bandages 5 days after surgery.  You may leave the incision open to air.  You may replace a dressing/Band-Aid to cover the incision for comfort if you wish. 8. ACTIVITIES as tolerated:   a. You may resume regular (light) daily activities  beginning the next day-such as daily self-care, walking, climbing stairs-gradually increasing activities as tolerated.  If you can walk 30 minutes without difficulty, it is safe to try more intense activity such as jogging, treadmill, bicycling, low-impact aerobics, swimming, etc. b. Save the most intensive and strenuous activity for last such as sit-ups, heavy lifting, contact sports, etc  Refrain from any heavy lifting or straining until you are off narcotics for pain control.   c. DO NOT PUSH THROUGH PAIN.  Let pain be your guide: If it hurts to do something, don't do it.  Pain is your body warning you to avoid that activity for another week until the pain goes down. d. You may drive when you are no longer taking prescription pain medication, you can comfortably wear a seatbelt, and you can safely maneuver your car and apply brakes. e. Bonita Quin may have sexual intercourse when it is comfortable.  9. FOLLOW UP in our office a. Please call [facility] at [phone] to set up an appointment to see your surgeon in the office for a follow-up appointment approximately 1-2 weeks after your surgery. b. Make sure that you call for this appointment the day you arrive home to insure a convenient appointment time. 10. IF  YOU HAVE DISABILITY OR FAMILY LEAVE FORMS, BRING THEM TO THE OFFICE FOR PROCESSING.  DO NOT GIVE THEM TO YOUR DOCTOR.   WHEN TO CALL us [phone]: 1. Poor pain control 2. Reactions / problems with new medications (rash/itching, nausea, etc)  3. Fever over 101.5 F (38.5 C) 4. Inability to urinate 5. Nausea and/or vomiting 6. Worsening swelling or bruising 7. Continued bleeding from incision. 8. Increased pain, redness, or drainage from the incision  The clinic staff is available to answer your questions during regular business hours (8:30am-5pm).  Please don't hesitate to call and ask to speak to one of our nurses for clinical concerns.   A surgeon from [facility] is always on call at the hospitals   If you have a medical emergency, go to the nearest emergency room or call 911.    [facility] [address], [address], [place], [place]  [address] ? MAIN: [phone] ? TOLL FREE: [phone] ? FAX [phone] [email or web address]

## 2013-10-02 ENCOUNTER — Other Ambulatory Visit: Payer: Self-pay | Admitting: Internal Medicine

## 2013-10-02 NOTE — Telephone Encounter (Signed)
Refill x 6 months 

## 2013-10-03 ENCOUNTER — Other Ambulatory Visit: Payer: Self-pay | Admitting: *Deleted

## 2013-10-03 MED ORDER — ZOLPIDEM TARTRATE 5 MG PO TABS: 5.0000 mg | ORAL_TABLET | Freq: Every evening | ORAL | Status: DC | PRN

## 2013-10-04 NOTE — Telephone Encounter (Signed)
Call and refill x 6 months 

## 2013-10-24 ENCOUNTER — Other Ambulatory Visit: Payer: Self-pay | Admitting: Internal Medicine

## 2014-02-12 ENCOUNTER — Other Ambulatory Visit: Payer: Self-pay

## 2014-02-12 MED ORDER — LEVOTHYROXINE SODIUM 100 MCG PO TABS: 100.0000 ug | ORAL_TABLET | Freq: Every evening | ORAL | Status: DC

## 2014-02-19 ENCOUNTER — Other Ambulatory Visit: Payer: Medicare Other | Admitting: Internal Medicine

## 2014-02-19 DIAGNOSIS — E785 Hyperlipidemia, unspecified: Secondary | ICD-10-CM

## 2014-02-19 DIAGNOSIS — Z1329 Encounter for screening for other suspected endocrine disorder: Secondary | ICD-10-CM

## 2014-02-19 DIAGNOSIS — I10 Essential (primary) hypertension: Secondary | ICD-10-CM

## 2014-02-19 DIAGNOSIS — E039 Hypothyroidism, unspecified: Secondary | ICD-10-CM

## 2014-02-19 DIAGNOSIS — Z130 Encounter for screening for diseases of the blood and blood-forming organs and certain disorders involving the immune mechanism: Secondary | ICD-10-CM

## 2014-02-19 DIAGNOSIS — Z13228 Encounter for screening for other metabolic disorders: Secondary | ICD-10-CM

## 2014-02-19 LAB — COMPREHENSIVE METABOLIC PANEL
ALK PHOS: 57 U/L (ref 39–117)
ALT: 20 U/L (ref 0–35)
AST: 18 U/L (ref 0–37)
Albumin: 4.3 g/dL (ref 3.5–5.2)
BILIRUBIN TOTAL: 0.5 mg/dL (ref 0.2–1.2)
BUN: 11 mg/dL (ref 6–23)
CO2: 27 meq/L (ref 19–32)
CREATININE: 0.66 mg/dL (ref 0.50–1.10)
Calcium: 9.5 mg/dL (ref 8.4–10.5)
Chloride: 103 mEq/L (ref 96–112)
Glucose, Bld: 83 mg/dL (ref 70–99)
Potassium: 4.3 mEq/L (ref 3.5–5.3)
Sodium: 139 mEq/L (ref 135–145)
TOTAL PROTEIN: 6.8 g/dL (ref 6.0–8.3)

## 2014-02-19 LAB — LIPID PANEL
CHOL/HDL RATIO: 2.7 ratio
CHOLESTEROL: 200 mg/dL (ref 0–200)
HDL: 75 mg/dL (ref >39)
LDL CALC: 107 mg/dL — AB (ref 0–99)
Triglycerides: 92 mg/dL (ref <150)
VLDL: 18 mg/dL (ref 0–40)

## 2014-02-19 LAB — CBC WITH DIFFERENTIAL/PLATELET
Basophils Absolute: 0 10*3/uL (ref 0.0–0.1)
Basophils Relative: 1 % (ref 0–1)
EOS ABS: 0 10*3/uL (ref 0.0–0.7)
Eosinophils Relative: 1 % (ref 0–5)
HCT: 40.1 % (ref 36.0–46.0)
Hemoglobin: 13.4 g/dL (ref 12.0–15.0)
LYMPHS ABS: 1.6 10*3/uL (ref 0.7–4.0)
Lymphocytes Relative: 41 % (ref 12–46)
MCH: 29.8 pg (ref 26.0–34.0)
MCHC: 33.4 g/dL (ref 30.0–36.0)
MCV: 89.3 fL (ref 78.0–100.0)
Monocytes Absolute: 0.3 10*3/uL (ref 0.1–1.0)
Monocytes Relative: 7 % (ref 3–12)
Neutro Abs: 1.9 10*3/uL (ref 1.7–7.7)
Neutrophils Relative %: 50 % (ref 43–77)
PLATELETS: 262 10*3/uL (ref 150–400)
RBC: 4.49 MIL/uL (ref 3.87–5.11)
RDW: 14.1 % (ref 11.5–15.5)
WBC: 3.8 10*3/uL — ABNORMAL LOW (ref 4.0–10.5)

## 2014-02-20 LAB — VITAMIN D 25 HYDROXY (VIT D DEFICIENCY, FRACTURES): VIT D 25 HYDROXY: 28 ng/mL — AB (ref 30–89)

## 2014-02-20 LAB — TSH: TSH: 0.76 u[IU]/mL (ref 0.350–4.500)

## 2014-02-21 ENCOUNTER — Encounter: Payer: Self-pay | Admitting: Internal Medicine

## 2014-02-21 ENCOUNTER — Other Ambulatory Visit (HOSPITAL_COMMUNITY)
Admission: RE | Admit: 2014-02-21 | Discharge: 2014-02-21 | Disposition: A | Discharge status: Home / Self Care | Payer: Medicare Other | Source: Ambulatory Visit | Attending: Internal Medicine | Admitting: Internal Medicine

## 2014-02-21 ENCOUNTER — Ambulatory Visit (INDEPENDENT_AMBULATORY_CARE_PROVIDER_SITE_OTHER): Payer: Medicare Other | Admitting: Internal Medicine

## 2014-02-21 VITALS — HR 68 | Temp 98.0°F | Ht 63.0 in | Wt 132.0 lb

## 2014-02-21 DIAGNOSIS — E785 Hyperlipidemia, unspecified: Secondary | ICD-10-CM

## 2014-02-21 DIAGNOSIS — M949 Disorder of cartilage, unspecified: Secondary | ICD-10-CM

## 2014-02-21 DIAGNOSIS — Z124 Encounter for screening for malignant neoplasm of cervix: Secondary | ICD-10-CM | POA: Insufficient documentation

## 2014-02-21 DIAGNOSIS — M899 Disorder of bone, unspecified: Secondary | ICD-10-CM

## 2014-02-21 DIAGNOSIS — Z87898 Personal history of other specified conditions: Secondary | ICD-10-CM

## 2014-02-21 DIAGNOSIS — Z0000 Encounter for general adult medical examination without abnormal findings: Secondary | ICD-10-CM

## 2014-02-21 DIAGNOSIS — E039 Hypothyroidism, unspecified: Secondary | ICD-10-CM

## 2014-02-21 DIAGNOSIS — G4700 Insomnia, unspecified: Secondary | ICD-10-CM

## 2014-02-21 DIAGNOSIS — M8580 Other specified disorders of bone density and structure, unspecified site: Secondary | ICD-10-CM

## 2014-02-21 DIAGNOSIS — Z9049 Acquired absence of other specified parts of digestive tract: Secondary | ICD-10-CM

## 2014-02-21 LAB — POCT URINALYSIS DIPSTICK
BILIRUBIN UA: NEGATIVE
Glucose, UA: NEGATIVE
KETONES UA: NEGATIVE
Leukocytes, UA: NEGATIVE
Nitrite, UA: NEGATIVE
PROTEIN UA: NEGATIVE
Spec Grav, UA: 1.01
Urobilinogen, UA: NEGATIVE
pH, UA: 7.5

## 2014-02-21 NOTE — Progress Notes (Signed)
Subjective:    Patient ID: Pamela [name], female    DOB: 09/14/1946, 68 y.o.   MRN: [id number]  HPI 68 year old White female in today for Welcome to Medicare physical examination. However, she has a fairly urgent issue she would like advice about. Says that she has a first cousin died of a sudden cardiac arrhythmia due to right ventricular dysplasia/cardiomyopathy. Several family members have tested positive for the gene and she wants to be tested as well although she has no children to pass this on to.  Patient was admitted to hospital with chest pain in 2002/04/13. She had a negative Cardiolite study at that time. She does have a history of 1 mm ST depression on EKG dating back to 13-Apr-2002. Today has 1 mm ST depression V2-through V4. I do not see that she ever had a 2-D echocardiogram during the cardiac workup. She has no complaint of chest pain or near syncope. No palpitations.   Patient has history of hyperlipidemia, osteopenia, fibrocystic breast disease, diverticulitis of the colon status post laparoscopic colon resection, hypothyroidism, insomnia. She was having recurrent bouts of diverticulitis that were getting more frequent and was evaluated by a surgeon who recommended colon resection.  History of positive ANA with a titer of 1:160 with no evidence of SLE seen by rheumatologist in 14-Apr-1995. Was diagnosed with a fibromyalgia type complex at the time.  Tetanus immunization done in the Fall of 2011, Zostavax vaccine January 2012, colonoscopy by Dr. Juanda Chance June 2013.  Diagnosed with hypothyroidism in 04/13/92. TSH was 33.3 when she was diagnosed.  Additional past medical history: History of left wrist fracture 1987 had to be repaired surgically. Has had recurrent urinary tract infections on occasion.  History of ectopic pregnancies in 1982, 1983, 1984/04/13 resulting in laparotomy each time.  Patient had tonsillectomy in the 1950s.  In 04/14/1983, she had a tubal repair laparotomy.  Last bone density study July  2012 showed a T score in the LS spine of -1.7 and a T score in the femoral neck of -2.1. Patient does not want to be on bisphosphonate medication. She takes calcium and vitamin D.  Social history: This is her second marriage. She is retired and enjoys gardening. 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 No history of smoking. Occasional wine consumption.  Family history: Father died in Apr 13, 2000 with history of coronary artery disease, CVA, MI, diabetes. Mother died at age 55 in 56 with history of hypertension and lung cancer. 3 brothers and one sister in good health. No children.      Review of Systems  Constitutional: Negative.   All other systems reviewed and are negative.       Objective:   Physical Exam  Vitals reviewed. Constitutional: She appears well-developed and well-nourished. No distress.  HENT:  Head: Normocephalic and atraumatic.  Right Ear: External ear normal.  Left Ear: External ear normal.  Mouth/Throat: Oropharynx is clear and moist. No oropharyngeal exudate.  Eyes: Conjunctivae and EOM are normal. Pupils are equal, round, and reactive to light. Right eye exhibits no discharge. Left eye exhibits no discharge. No scleral icterus.  Neck: Neck supple. No JVD present. No thyromegaly present.  Cardiovascular: Normal rate, regular rhythm, normal heart sounds and intact distal pulses.   No murmur heard. Pulmonary/Chest: Effort normal and breath sounds normal. No respiratory distress. She has no wheezes. She has no rales. She exhibits no tenderness.  Abdominal: Soft. Bowel sounds are normal. She exhibits no distension and no  mass. There is no tenderness. There is no rebound and no guarding.  Genitourinary:  Pap taken. Bimanual normal.  Musculoskeletal: Normal range of motion. She exhibits no edema.  Lymphadenopathy:    She has no cervical adenopathy.  Neurological: She is alert. She has normal reflexes. She displays normal reflexes. No cranial nerve deficit.  Coordination normal.  Skin: Skin is warm and dry. No rash noted. She is not diaphoretic.  Psychiatric: She has a normal mood and affect. Her behavior is normal. Judgment and thought content normal.          Assessment & Plan:  Hypothyroidism-stable on thyroid replacement therapy  History of hyperlipidemia-does not want to be on statin medication  History of recurrent diverticulitis status post laparoscopic colon resection 2014  History of fibrocystic breast disease  History of insomnia  History of osteopenia-treated with calcium and vitamin D. Does not want to be on bisphosphonates.  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 Continue thyroid replacement at same dose. Return here in one year.  Subjective:   Patient presents for Medicare Annual/Subsequent preventive examination.   Review Past Medical/Family/Social: See above   Risk Factors  Current exercise habits: Gardening and walking Dietary issues discussed: Low-fat low-carb  Cardiac risk factors: Hyperlipidemia  Depression Screen  (Note: if answer to either of the following is "Yes", a more complete depression screening is indicated)  Over the past two weeks, have you felt down, depressed or hopeless? no  Over the past two weeks, have you felt little interest or pleasure in doing things? no Have you lost interest or pleasure in daily life? no Do you often feel hopeless? no Do you cry easily over simple problems? No   Activities of Daily Living  In your present state of health, do you have any difficulty performing the following activities?:  Driving? No  Managing money? No  Feeding yourself? No  Getting from bed to chair? No  Climbing a flight of stairs? No  Preparing food and eating?: No  Bathing or showering? No  Getting dressed: No  Getting to the toilet? No  Using the  toilet:No  Moving around from place to place: No  In the past year have you fallen or had a near fall?:No  Are you sexually active? No  Do you have more than one partner? No   Hearing Difficulties: No  Do you often ask people to speak up or repeat themselves? No  Do you experience ringing or noises in your ears? No Do you have difficulty understanding soft or whispered voices? No  Do you feel that you have a problem with memory? no Do you often misplace items? No  Do you feel safe at home? Yes   Cognitive Testing  Alert? Yes Normal Appearance?Yes  Oriented to person? Yes Place? Yes  Time? Yes  Recall of three objects? Yes  Can perform simple calculations? Yes  Displays appropriate judgment?Yes  Can read the correct time from a watch face?Yes   List the Names of Other Physician/Practitioners you currently use:  Eye physician  Indicate any recent Medical Services you may have received from other than [facility] providers in the past year (date may be approximate).  Laparoscopic sigmoid colectomy October 2014 Dr. Star Age Screening Tests / Date Colonoscopy    see above                 Zostavax  Mammogram  Influenza Vaccine  Tetanus/tdap   Objective:       General appearance: Appears stated age  Head: Normocephalic, without obvious abnormality, atraumatic  Eyes: conj clear, EOMi PEERLA  Ears: normal TM's and external ear canals both ears  Nose: Nares normal. Septum midline. Mucosa normal. No drainage or sinus tenderness.  Throat: lips, mucosa, and tongue normal; teeth and gums normal  Neck: no adenopathy, no carotid bruit, no JVD, supple, symmetrical, trachea midline and thyroid not enlarged, symmetric, no tenderness/mass/nodules  No CVA tenderness.  Lungs: clear to auscultation bilaterally  Breasts: normal appearance, no masses or tenderness Heart: regular rate and rhythm, S1, S2 normal, no murmur, click, rub or gallop  Abdomen: soft, non-tender; bowel sounds normal;  no masses, no organomegaly  Musculoskeletal: ROM normal in all joints, no crepitus, no deformity, Normal muscle strengthen. Back  is symmetric, no curvature. Skin: Skin color, texture, turgor normal. No rashes or lesions  Lymph nodes: Cervical, supraclavicular, and axillary nodes normal.  Neurologic: CN 2 -12 Normal, Normal symmetric reflexes. Normal coordination and gait  Psych: Alert & Oriented x 3, Mood appear stable.    Assessment:    Annual wellness medicare exam   Plan:    During the course of the visit the patient was educated and counseled about appropriate screening and preventive services including:  Screening mammography  Colorectal cancer screening up todate Annual influenza vaccination Diet review for nutrition referral? Yes ____ Not Indicated __x__  Medicare Attestation  I have personally reviewed:  The patient's medical and social history  Their use of alcohol, tobacco or illicit drugs  Their current medications and supplements  The patient's functional ability including ADLs,fall risks, home safety risks, cognitive, and hearing and visual impairment  Diet and physical activities  Evidence for depression or mood disorders  The patient's weight, height, BMI, and visual acuity have been recorded in the chart. I have made referrals, counseling, and provided education to the patient based on review of the above and I have provided the patient with a written personalized care plan for preventive services

## 2014-02-25 NOTE — Patient Instructions (Signed)
Return in one year or as needed. 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

## 2014-03-27 DIAGNOSIS — Z8249 Family history of ischemic heart disease and other diseases of the circulatory system: Secondary | ICD-10-CM | POA: Insufficient documentation

## 2014-05-08 ENCOUNTER — Telehealth: Payer: Self-pay | Admitting: Internal Medicine

## 2014-05-09 ENCOUNTER — Other Ambulatory Visit: Payer: Self-pay

## 2014-05-09 MED ORDER — SIMVASTATIN 10 MG PO TABS: 10.0000 mg | ORAL_TABLET | Freq: Every day | ORAL | Status: DC

## 2014-05-09 NOTE — Telephone Encounter (Signed)
Please handle refills.

## 2014-05-14 ENCOUNTER — Other Ambulatory Visit: Payer: Self-pay

## 2014-05-14 MED ORDER — LEVOTHYROXINE SODIUM 100 MCG PO TABS: 100.0000 ug | ORAL_TABLET | Freq: Every evening | ORAL | Status: DC

## 2014-05-14 MED ORDER — SIMVASTATIN 10 MG PO TABS: 10.0000 mg | ORAL_TABLET | Freq: Every day | ORAL | Status: DC

## 2014-05-28 ENCOUNTER — Other Ambulatory Visit: Payer: Medicare Other | Admitting: Internal Medicine

## 2014-05-28 DIAGNOSIS — D72819 Decreased white blood cell count, unspecified: Secondary | ICD-10-CM

## 2014-05-28 LAB — CBC WITH DIFFERENTIAL/PLATELET
Basophils Absolute: 0 10*3/uL (ref 0.0–0.1)
Basophils Relative: 1 % (ref 0–1)
Eosinophils Absolute: 0.2 10*3/uL (ref 0.0–0.7)
Eosinophils Relative: 4 % (ref 0–5)
HCT: 39.8 % (ref 36.0–46.0)
Hemoglobin: 13.7 g/dL (ref 12.0–15.0)
Lymphocytes Relative: 40 % (ref 12–46)
Lymphs Abs: 1.8 10*3/uL (ref 0.7–4.0)
MCH: 30 pg (ref 26.0–34.0)
MCHC: 34.4 g/dL (ref 30.0–36.0)
MCV: 87.1 fL (ref 78.0–100.0)
Monocytes Absolute: 0.4 10*3/uL (ref 0.1–1.0)
Monocytes Relative: 8 % (ref 3–12)
Neutro Abs: 2.2 10*3/uL (ref 1.7–7.7)
Neutrophils Relative %: 47 % (ref 43–77)
Platelets: 228 10*3/uL (ref 150–400)
RBC: 4.57 MIL/uL (ref 3.87–5.11)
RDW: 13.9 % (ref 11.5–15.5)
WBC: 4.6 10*3/uL (ref 4.0–10.5)

## 2014-05-29 NOTE — Progress Notes (Signed)
Message left on answering machine 

## 2014-07-30 ENCOUNTER — Other Ambulatory Visit: Payer: Self-pay

## 2014-07-30 DIAGNOSIS — Z1231 Encounter for screening mammogram for malignant neoplasm of breast: Secondary | ICD-10-CM

## 2014-08-09 ENCOUNTER — Ambulatory Visit
Admission: RE | Admit: 2014-08-09 | Discharge: 2014-08-09 | Disposition: A | Discharge status: Home / Self Care | Payer: Medicare Other | Source: Ambulatory Visit

## 2014-08-09 DIAGNOSIS — Z1231 Encounter for screening mammogram for malignant neoplasm of breast: Secondary | ICD-10-CM

## 2015-02-24 ENCOUNTER — Other Ambulatory Visit: Payer: Medicare Other | Admitting: Internal Medicine

## 2015-02-24 DIAGNOSIS — Z79899 Other long term (current) drug therapy: Secondary | ICD-10-CM

## 2015-02-24 DIAGNOSIS — M8580 Other specified disorders of bone density and structure, unspecified site: Secondary | ICD-10-CM

## 2015-02-24 DIAGNOSIS — E039 Hypothyroidism, unspecified: Secondary | ICD-10-CM

## 2015-02-24 DIAGNOSIS — E785 Hyperlipidemia, unspecified: Secondary | ICD-10-CM

## 2015-02-24 LAB — TSH: TSH: 0.503 u[IU]/mL (ref 0.350–4.500)

## 2015-02-24 LAB — COMPLETE METABOLIC PANEL WITH GFR
ALT: 11 U/L (ref 0–35)
AST: 16 U/L (ref 0–37)
Albumin: 4.3 g/dL (ref 3.5–5.2)
Alkaline Phosphatase: 56 U/L (ref 39–117)
BILIRUBIN TOTAL: 0.7 mg/dL (ref 0.2–1.2)
BUN: 14 mg/dL (ref 6–23)
CHLORIDE: 104 meq/L (ref 96–112)
CO2: 26 mEq/L (ref 19–32)
CREATININE: 0.74 mg/dL (ref 0.50–1.10)
Calcium: 9 mg/dL (ref 8.4–10.5)
GFR, EST NON AFRICAN AMERICAN: 84 mL/min
GFR, Est African American: >89 mL/min
GLUCOSE: 85 mg/dL (ref 70–99)
Potassium: 4.6 mEq/L (ref 3.5–5.3)
Sodium: 139 mEq/L (ref 135–145)
Total Protein: 6.5 g/dL (ref 6.0–8.3)

## 2015-02-24 LAB — LIPID PANEL
CHOLESTEROL: 177 mg/dL (ref 0–200)
HDL: 66 mg/dL (ref >=46)
LDL Cholesterol: 88 mg/dL (ref 0–99)
Total CHOL/HDL Ratio: 2.7 Ratio
Triglycerides: 117 mg/dL (ref <150)
VLDL: 23 mg/dL (ref 0–40)

## 2015-02-24 LAB — CBC WITH DIFFERENTIAL/PLATELET
BASOS ABS: 0 10*3/uL (ref 0.0–0.1)
BASOS PCT: 1 % (ref 0–1)
EOS ABS: 0.1 10*3/uL (ref 0.0–0.7)
Eosinophils Relative: 2 % (ref 0–5)
HCT: 41.7 % (ref 36.0–46.0)
HEMOGLOBIN: 13.8 g/dL (ref 12.0–15.0)
Lymphocytes Relative: 38 % (ref 12–46)
Lymphs Abs: 1.7 10*3/uL (ref 0.7–4.0)
MCH: 29.5 pg (ref 26.0–34.0)
MCHC: 33.1 g/dL (ref 30.0–36.0)
MCV: 89.1 fL (ref 78.0–100.0)
MPV: 10.9 fL (ref 8.6–12.4)
Monocytes Absolute: 0.3 10*3/uL (ref 0.1–1.0)
Monocytes Relative: 6 % (ref 3–12)
NEUTROS PCT: 53 % (ref 43–77)
Neutro Abs: 2.3 10*3/uL (ref 1.7–7.7)
PLATELETS: 239 10*3/uL (ref 150–400)
RBC: 4.68 MIL/uL (ref 3.87–5.11)
RDW: 13.4 % (ref 11.5–15.5)
WBC: 4.4 10*3/uL (ref 4.0–10.5)

## 2015-02-25 ENCOUNTER — Ambulatory Visit (INDEPENDENT_AMBULATORY_CARE_PROVIDER_SITE_OTHER): Payer: Medicare Other | Admitting: Internal Medicine

## 2015-02-25 ENCOUNTER — Encounter: Payer: Self-pay | Admitting: Internal Medicine

## 2015-02-25 VITALS — BP 122/80 | HR 74 | Temp 97.8°F | Ht 62.0 in | Wt 134.0 lb

## 2015-02-25 DIAGNOSIS — Z9049 Acquired absence of other specified parts of digestive tract: Secondary | ICD-10-CM | POA: Diagnosis not present

## 2015-02-25 DIAGNOSIS — E785 Hyperlipidemia, unspecified: Secondary | ICD-10-CM | POA: Diagnosis not present

## 2015-02-25 DIAGNOSIS — E039 Hypothyroidism, unspecified: Secondary | ICD-10-CM

## 2015-02-25 DIAGNOSIS — G4700 Insomnia, unspecified: Secondary | ICD-10-CM

## 2015-02-25 DIAGNOSIS — E559 Vitamin D deficiency, unspecified: Secondary | ICD-10-CM | POA: Diagnosis not present

## 2015-02-25 DIAGNOSIS — Z0000 Encounter for general adult medical examination without abnormal findings: Secondary | ICD-10-CM

## 2015-02-25 DIAGNOSIS — M8580 Other specified disorders of bone density and structure, unspecified site: Secondary | ICD-10-CM

## 2015-02-25 LAB — VITAMIN D 25 HYDROXY (VIT D DEFICIENCY, FRACTURES): Vit D, 25-Hydroxy: 23 ng/mL — ABNORMAL LOW (ref 30–100)

## 2015-02-25 MED ORDER — LEVOTHYROXINE SODIUM 100 MCG PO TABS: 100.0000 ug | ORAL_TABLET | Freq: Every evening | ORAL | Status: DC

## 2015-02-25 MED ORDER — SIMVASTATIN 10 MG PO TABS: 10.0000 mg | ORAL_TABLET | Freq: Every day | ORAL | Status: DC

## 2015-02-25 MED ORDER — TOLTERODINE TARTRATE ER 2 MG PO CP24: 2.0000 mg | ORAL_CAPSULE | Freq: Every day | ORAL | Status: DC

## 2015-02-25 NOTE — Patient Instructions (Addendum)
Continue same dose of thyroid replacement. Ambien prescribed to take sparingly for insomnia. Please take 2000 units vitamin D 3 daily. Have annual mammogram. Return in one year or as needed. To get Prevnar in the near future.

## 2015-02-25 NOTE — Progress Notes (Signed)
Subjective:    Patient ID: Pamela LagerRebecca Marcinek, female    DOB: [birth year], 69 y.o.   MRN: [id number]  HPI  69 year old White Female in today for health maintenance exam and evaluation of medical issues.  Last year she had a cousin died of a sudden cardiac arrhythmia due to right ventricular dysplasia/cardiomyopathy. [facility] and was found not to have the condition. Several family members tested positive for the gene.  Patient was admitted to the hospital with chest pain in 2003. She had a negative Cardiolite study at that time. She does have a history of 1 mm ST depression on EKG dating back to 2003. Today no complaint of chest pain or near syncope. No palpitations.  Patient has a history of hyperlipidemia currently on low-dose statin medication which was provided today, osteopenia, fibrocystic breast disease, diverticulitis of the colon status post laparoscopic colon resection, hypothyroidism, insomnia. She was having recurrent bouts of diverticulitis that were getting much more frequent and was evaluated by surgeon who recommended colon resection and she has done well.  History of positive ANA with titer of 1:160 with no evidence of SLE seen by rheumatologist in 1996. Was diagnosed with a fibromyalgia-type complex at the time.  Had colonoscopy by Dr. Juanda ChanceBrodie June 2013. Tetanus immunization 2011. Zostavax vaccine 2012. Is a candidate for Prevnar.  Diagnosed with hypothyroidism in 1993. TSH was 33.3 when she was diagnosed.  Additional past medical history: Left wrist fracture 1987 had to be repaired surgically. Has had recurrent urinary infections on occasion.  History of ectopic pregnancies 1982, 1983, 1985 resulting in laparotomy each time.  Patient had tonsillectomy in the 1950s. In 1984 she had a tubal repair laparotomy.  Bone density study July 2012 showed a T score in LS spine of -1.7 and a T score in the femoral neck of -2.1. Patient does not want to be on  bisphosphonate medication. She takes calcium and vitamin D although admits she doesn't take vitamin D as regularly as she should.  Social history: This is her second marriage. She is retired. Enjoys gardening. 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 No history of smoking. Occasional wine consumption.  Family history: Father died in 2001 with history of coronary artery disease, CVA, MI, diabetes. Mother died at age 67 in [id number] with history of hypertension and lung cancer. 3 brothers and one sister in good health. No children.    Review of Systems  All other systems reviewed and are negative.      Objective:   Physical Exam  Constitutional: She is oriented to person, place, and time. She appears well-developed and well-nourished. No distress.  HENT:  Head: Normocephalic and atraumatic.  Right Ear: External ear normal.  Left Ear: External ear normal.  Mouth/Throat: Oropharynx is clear and moist. No oropharyngeal exudate.  Eyes: Conjunctivae and EOM are normal. Pupils are equal, round, and reactive to light. Right eye exhibits no discharge. Left eye exhibits no discharge. No scleral icterus.  Neck: Neck supple. No JVD present. No thyromegaly present.  Cardiovascular: Normal rate, regular rhythm, normal heart sounds and intact distal pulses.   No murmur heard. Pulmonary/Chest: Effort normal and breath sounds normal. No respiratory distress. She has no wheezes. She has no rales. She exhibits no tenderness.  Breasts normal female without masses  Abdominal: Soft. Bowel sounds are normal. She exhibits no distension and no mass. There is no rebound and no guarding.  Genitourinary:  Pap taken March  2015. Bimanual normal  Musculoskeletal: Normal range of motion. She exhibits no edema.  Lymphadenopathy:    She has no cervical adenopathy.  Neurological: She is alert and oriented to person, place, and time. She has normal reflexes. No cranial nerve deficit. Coordination normal.    Skin: Skin is warm and dry. No rash noted. She is not diaphoretic.  Psychiatric: She has a normal mood and affect. Her behavior is normal. Judgment and thought content normal.  Vitals reviewed.         Assessment & Plan:  Hypothyroidism-TSH stable on current dose of thyroid replacement  Vitamin D deficiency-needs take 2000 units Vitamin D3 daily  Osteopenia-knees take vitamin D supplement. She does take calcium.  History of fibrocystic breast disease-recommend annual mammogram  History of insomnia-asking for prescription for Ambien which was provided today.  History of recurrent diverticulitis status post laparoscopic colon resection 2004  History of hyperlipidemia-stable/normal currently on low-dose statin medication on low-dose statin medication  Plan: Medical issues are stable and she can return in one year or as needed. Recommend annual mammogram. Bone density study due this year. Last on file 2012 at [place]. To get Prevnar in the near future.   Subjective:   Patient presents for Medicare Annual/Subsequent preventive examination.  Review Past Medical/Family/Social:see above   Risk Factors  Current exercise habits: gardens alot Dietary issues discussed: low fat low carb  Cardiac risk factors:Family history, prior history of hyperlipidemia  Depression Screen  (Note: if answer to either of the following is "Yes", a more complete depression screening is indicated)   Over the past two weeks, have you felt down, depressed or hopeless? No  Over the past two weeks, have you felt little interest or pleasure in doing things? No Have you lost interest or pleasure in daily life? No Do you often feel hopeless? No Do you cry easily over simple problems? No   Activities of Daily Living  In your present state of health, do you have any difficulty performing the following activities?:   Driving? No  Managing money? No  Feeding yourself? No  Getting from bed to chair? No   Climbing a flight of stairs? No  Preparing food and eating?: No  Bathing or showering? No  Getting dressed: No  Getting to the toilet? No  Using the toilet:No  Moving around from place to place: No  In the past year have you fallen or had a near fall?:No  Are you sexually active? No  Do you have more than one partner? No   Hearing Difficulties: No  Do you often ask people to speak up or repeat themselves? No  Do you experience ringing or noises in your ears? No  Do you have difficulty understanding soft or whispered voices? No  Do you feel that you have a problem with memory? No Do you often misplace items? No    Home Safety:  Do you have a smoke alarm at your residence? Yes Do you have grab bars in the bathroom?yes Do you have throw rugs in your house?yes   Cognitive Testing  Alert? Yes Normal Appearance?Yes  Oriented to person? Yes Place? Yes  Time? Yes  Recall of three objects? Yes  Can perform simple calculations? Yes  Displays appropriate judgment?Yes  Can read the correct time from a watch face?Yes   List the Names of Other Physician/Practitioners you currently use:  See referral list for the physicians patient is currently seeing.     Review of Systems: See above  Objective:     General appearance: Appears stated age  Head: Normocephalic, without obvious abnormality, atraumatic  Eyes: conj clear, EOMi PEERLA  Ears: normal TM's and external ear canals both ears  Nose: Nares normal. Septum midline. Mucosa normal. No drainage or sinus tenderness.  Throat: lips, mucosa, and tongue normal; teeth and gums normal  Neck: no adenopathy, no carotid bruit, no JVD, supple, symmetrical, trachea midline and thyroid not enlarged, symmetric, no tenderness/mass/nodules  No CVA tenderness.  Lungs: clear to auscultation bilaterally  Breasts: normal appearance, no masses or tenderness Heart: regular rate and rhythm, S1, S2 normal, no murmur, click, rub or gallop   Abdomen: soft, non-tender; bowel sounds normal; no masses, no organomegaly  Musculoskeletal: ROM normal in all joints, no crepitus, no deformity, Normal muscle strengthen. Back  is symmetric, no curvature. Skin: Skin color, texture, turgor normal. No rashes or lesions  Lymph nodes: Cervical, supraclavicular, and axillary nodes normal.  Neurologic: CN 2 -12 Normal, Normal symmetric reflexes. Normal coordination and gait  Psych: Alert & Oriented x 3, Mood appear stable.    Assessment:    Annual wellness medicare exam   Plan:    During the course of the visit the patient was educated and counseled about appropriate screening and preventive services including:   Annual mammogram  Bone density study-last on file 2012  Anuli exam     Patient Instructions (the written plan) was given to the patient.  Medicare Attestation  I have personally reviewed:  The patient's medical and social history  Their use of alcohol, tobacco or illicit drugs  Their current medications and supplements  The patient's functional ability including ADLs,fall risks, home safety risks, cognitive, and hearing and visual impairment  Diet and physical activities  Evidence for depression or mood disorders  The patient's weight, height, BMI, and visual acuity have been recorded in the chart. I have made referrals, counseling, and provided education to the patient based on review of the above and I have provided the patient with a written personalized care plan for preventive services.

## 2015-03-19 ENCOUNTER — Other Ambulatory Visit: Payer: Self-pay | Admitting: *Deleted

## 2015-03-19 MED ORDER — ZOLPIDEM TARTRATE 5 MG PO TABS: 5.0000 mg | ORAL_TABLET | Freq: Every evening | ORAL | Status: DC | PRN

## 2015-03-19 NOTE — Telephone Encounter (Signed)
Patient called requesting refill on her Remus Lofflerambien script called in to patient pharmacy

## 2015-06-25 ENCOUNTER — Ambulatory Visit (INDEPENDENT_AMBULATORY_CARE_PROVIDER_SITE_OTHER): Payer: Medicare Other | Admitting: Internal Medicine

## 2015-06-25 VITALS — BP 120/76 | HR 70

## 2015-06-25 DIAGNOSIS — Z23 Encounter for immunization: Secondary | ICD-10-CM | POA: Diagnosis not present

## 2015-06-25 NOTE — Progress Notes (Signed)
Patient presents today for Prevnar vaccine. VS stable. Patient tolerated injection well. 

## 2015-09-02 ENCOUNTER — Encounter: Payer: Self-pay | Admitting: Family Medicine

## 2015-09-02 ENCOUNTER — Ambulatory Visit (INDEPENDENT_AMBULATORY_CARE_PROVIDER_SITE_OTHER): Payer: Medicare Other | Admitting: Family Medicine

## 2015-09-02 VITALS — BP 175/97 | HR 64 | Ht 62.0 in | Wt 130.0 lb

## 2015-09-02 DIAGNOSIS — M79601 Pain in right arm: Secondary | ICD-10-CM

## 2015-09-02 MED ORDER — MELOXICAM 15 MG PO TABS: 15.0000 mg | ORAL_TABLET | Freq: Every day | ORAL | Status: DC

## 2015-09-02 NOTE — Patient Instructions (Signed)
Your exam is reassuring. You have overuse strains of your bicep and forearm muscles. Heat 15 minutes at a time 3-4 times a day but use ice after you exercise. Meloxicam 15 mg daily with food for 7-10 days then as needed. Elbow sleeve for compression. Start physical therapy but only for 1-2 visits to learn a more extensive home exercise program to do daily for next 6 weeks. Follow up with me in 6 weeks if needed.

## 2015-09-03 DIAGNOSIS — M79601 Pain in right arm: Secondary | ICD-10-CM | POA: Insufficient documentation

## 2015-09-03 NOTE — Assessment & Plan Note (Signed)
exam overall reassuring besides some pain with biceps, forearm extensors.  Consistent with overuse strains here.  Heat, meloxicam and elbow sleeve for compression.  Start physical therapy and home exercises.  F/u in 6 weeks.

## 2015-09-03 NOTE — Progress Notes (Signed)
PCP: [name], MD  Subjective:   HPI: Patient is a 69 y.o. female here for right arm pain.  Patient denies known injury or trauma. She states for past month she's had pain 5/10 level in right upper arm. Some radiation into forearm. Some tingling as well. No neck pain. Worse when on computer. Tried tylenol. No skin changes, fever. Feels like she is losing strength in the arm. Right handed.  Past Medical History  Diagnosis Date  . Diverticulosis   . Osteopenia   . Diverticulitis   . Hyperlipidemia   . Vaginal atrophy   . Vitamin D deficiency   . Fibrocystic breast disease   . Hypothyroidism   . Tubal ectopic pregnancy     x 3  . Colon polyps   . Hypertension     elevated bp in past, no current meds  . Complication of anesthesia 1980's    laparoscopic procedure invitro, bradycardia and heart stopped at [facility]    Current Outpatient Prescriptions on File Prior to Visit  Medication Sig Dispense Refill  . acetaminophen (TYLENOL) 500 MG tablet Take 1,000 mg by mouth every 6 (six) hours as needed for pain.    . cholecalciferol (VITAMIN D) 1000 UNITS tablet Take 1,000 Units by mouth daily.    Marland Kitchen levothyroxine (SYNTHROID, LEVOTHROID) 100 MCG tablet Take 1 tablet (100 mcg total) by mouth every evening. 90 tablet 3  . simvastatin (ZOCOR) 10 MG tablet Take 1 tablet (10 mg total) by mouth daily at 6 PM. 90 tablet 3  . tolterodine (DETROL LA) 2 MG 24 hr capsule Take 1 capsule (2 mg total) by mouth daily. 90 capsule 3  . zolpidem (AMBIEN) 5 MG tablet Take 1 tablet (5 mg total) by mouth at bedtime as needed. 30 tablet 5   No current facility-administered medications on file prior to visit.    Past Surgical History  Procedure Laterality Date  . Wrist fracture surgery Left 1987  . Ectopic pregnancy surgery  1980s    x3  . Fertility surgery      x4 IVF  . Tubal repair  1980's  . Partial colectomy      Allergies  Allergen Reactions  . Succinylcholine Other (See Comments)   BRADYCARDIA/  ASYSTOLE 1980's  . Boniva [Ibandronate Sodium]     GI  . Morphine And Related Nausea And Vomiting  . Penicillins Hives    Social History   Social History  . Marital Status: Married    Spouse Name: N/A  . Number of Children: 0  . Years of Education: N/A   Occupational History  . accountant    Social History Main Topics  . Smoking status: Never Smoker   . Smokeless tobacco: Never Used  . Alcohol Use: 0.0 oz/week    0 Standard drinks or equivalent per week     Comment: 1 per day  . Drug Use: No  . Sexual Activity: Not on file   Other Topics Concern  . Not on file   Social History Narrative    Family History  Problem Relation Age of Onset  . Colon cancer Paternal Uncle   . Cancer Paternal Uncle     Colon  . Bladder Cancer Paternal Uncle   . Cancer Paternal Chief Financial Officer  . Prostate cancer Paternal Uncle   . Skin cancer Paternal Uncle   . Diabetes Father   . Heart disease Father   . Stroke Father   . Diabetes Other  father's side juvenile and adult onset  . Hypertension Mother   . Cancer Mother     Lung  . Cancer Paternal Grandfather     Skin/Stomach    BP 175/97 mmHg  Pulse 64  Ht  (1.575 m)  Wt 130 lb (58.968 kg)  BMI 23.77 kg/m2  Review of Systems: See HPI above.    Objective:  Physical Exam:  Gen: NAD  Right shoulder, elbow: No swelling, ecchymoses.  No gross deformity. No TTP. FROM with pain on elbow flexion, supination, wrist extension.  5/5 strength all motions of right upper extremity. Negative Hawkins, Neers. Negative Speeds, Yergasons. Strength 5/5 with empty can and resisted internal/external rotation. Negative apprehension. NV intact distally.  Left shoulder: FROM without pain.    Assessment & Plan:  1. Right arm pain - exam overall reassuring besides some pain with biceps, forearm extensors.  Consistent with overuse strains here.  Heat, meloxicam and elbow sleeve for compression.  Start  physical therapy and home exercises.  F/u in 6 weeks.

## 2015-09-04 ENCOUNTER — Ambulatory Visit: Payer: Medicare Other | Attending: Internal Medicine

## 2015-09-04 DIAGNOSIS — R6889 Other general symptoms and signs: Secondary | ICD-10-CM | POA: Insufficient documentation

## 2015-09-04 DIAGNOSIS — M79621 Pain in right upper arm: Secondary | ICD-10-CM | POA: Insufficient documentation

## 2015-09-04 DIAGNOSIS — M62838 Other muscle spasm: Secondary | ICD-10-CM | POA: Diagnosis present

## 2015-09-04 DIAGNOSIS — M25521 Pain in right elbow: Secondary | ICD-10-CM

## 2015-09-04 NOTE — Therapy (Addendum)
[facility] Outpatient Rehabilitation [facility] [address] [place], [place], [address] Phone: [phone]   Fax:  [phone]  Physical Therapy Evaluation  Patient Details  Name: Pamela [name] MRN: [id number] Date of Birth: 06-Dec-1945 Referring Provider:  [name], MD  Encounter Date: 09/04/2015      PT End of Session - 09/04/15 1057    Visit Number 1   Number of Visits 8   Date for PT Re-Evaluation 10/02/15   PT Start Time 1015   PT Stop Time 1055   PT Time Calculation (min) 40 min   Activity Tolerance Patient tolerated treatment well   Behavior During Therapy [facility] for tasks assessed/performed      Past Medical History  Diagnosis Date  . Diverticulosis   . Osteopenia   . Diverticulitis   . Hyperlipidemia   . Vaginal atrophy   . Vitamin D deficiency   . Fibrocystic breast disease   . Hypothyroidism   . Tubal ectopic pregnancy     x 3  . Colon polyps   . Hypertension     elevated bp in past, no current meds  . Complication of anesthesia 1980's    laparoscopic procedure invitro, br[facility] and heart [facility]    Past Surgical History  Procedure Laterality Date  . Wrist fracture surgery Left 1987  . Ectopic pregnancy surgery  1980s    x3  . Fertility surgery      x4 IVF  . Tubal repair  1980's  . Partial colectomy      There were no vitals filed for this visit.  Visit Diagnosis:  Pain in joint, upper arm, right - Plan: PT plan of care cert/re-cert  Activity intolerance - Plan: PT plan of care cert/re-cert  Muscle spasm - Plan: PT plan of care cert/re-cert      Subjective Assessment - 09/04/15 1021    Subjective She reports onset of pain possibly with yard work and recently crochet work. Using mouse on computer causes pain.  When working using mouse caused some discomfort  . no other probelms before   Patient is accompained by: Family member   Limitations --  Usoing mouse at [employer] , reaching ,  brushin teeth,  picking  up dog.  use of arm   How long can you sit comfortably? As needed   How long can you stand comfortably? As needed   How long can you walk comfortably? As needed   Diagnostic tests None   Patient Stated Goals To use arm as used to without pain.    Currently in Pain? Yes   Pain Score 2    Pain Location Shoulder  and bicep RT   Pain Orientation Right;Anterior   Pain Descriptors / Indicators Aching   Pain Type Acute pain   Pain Onset More than a month ago   Pain Frequency Intermittent   Aggravating Factors  Using arm   Pain Relieving Factors meds and heat,  arm over head   Multiple Pain Sites No            [facility] PT Assessment - 09/04/15 1015    Assessment   Medical Diagnosis RT bicep and forarm strain   Onset Date/Surgical Date --  a month ago   Next MD Visit 6 weeks if needed   Prior Therapy No   Precautions   Precautions None   Restrictions   Weight Bearing Restrictions No   Balance Screen   Has the patient fallen in the past  6 months No   Has the patient had a decrease in activity level because of a fear of falling?  No   Is the patient reluctant to leave their home because of a fear of falling?  No   Prior Function   Level of Independence Independent   Cognition   Overall Cognitive Status Within Functional Limits for tasks assessed   ROM / Strength   AROM / PROM / Strength AROM;Strength   AROM   Overall AROM Comments Cervical range was WNL and did not cause shoulder or RT anrm pain.   AROM Assessment Site Shoulder;Elbow;Wrist   Right/Left Shoulder Right   Right Shoulder Flexion 162 Degrees   Right Shoulder ABduction 174 Degrees   Right Shoulder Internal Rotation 60 Degrees   Right Shoulder External Rotation 90 Degrees   Right/Left Elbow Right;Left   Right Elbow Flexion 145   Right Elbow Extension 0   Left Elbow Flexion 145   Left Elbow Extension 0   Right/Left Wrist Right   Strength   Overall Strength Comments abduction testing.   Pain in bicep with  resisted  supination   Palpation   Palpation comment Tender anterior shoulder /bicipital groove an less so biceps. No real tenderness  RT elbow.    Ambulation/Gait   Gait Comments Normal          Kineseotape applied with one strip to inhibit upper trap and second y strip over RT biceps                 PT Education - 09/04/15 1057    Education provided Yes   Education Details POC, tape management, use of ice, suppor of RT arm   Person(s) Educated Patient   Methods Explanation;Demonstration;Tactile cues;Verbal cues;Handout   Comprehension Returned demonstration;Verbalized understanding             PT Long Term Goals - 09/04/15 1102    PT LONG TERM GOAL #1   Title She will be independent with all HEP issued as of last visit   Time 4   Period Weeks   Status New   PT LONG TERM GOAL #2   Title She will report pain improved 50% or more with needle work /crochet   Time 4   Period Weeks   Status New   PT LONG TERM GOAL #3   Title She will report pain decreased 50% with yard work.    Time 4   Period Weeks   Status New   PT LONG TERM GOAL #4   Title She will report 50% decreased pain with MMT abduction   Time 4   Period Weeks   Status New               Plan - 09/04/15 1058    Clinical Impression Statement [name] presents withn painand tnederness anterior RT shoulder, pain with resisted shoulder abduction and tenderness in RT upper trap. She has pain with functional use of RT arm with yard work and rec activity. She should improve with PT   Pt will benefit from skilled therapeutic intervention in order to improve on the following deficits Impaired UE functional use;Pain;Increased muscle spasms;Decreased activity tolerance   Rehab Potential Good   PT Frequency 2x / week   PT Duration 4 weeks   PT Treatment/Interventions Cryotherapy;Iontophoresis 4mg /ml Dexamethasone;Ultrasound;Therapeutic exercise;Taping;Manual techniques;Dry needling;Patient/family  education;Visual/perceptual remediation/compensation   PT Next Visit Plan STW to RT bicep  and traps, ionto if order signed . retape if helful  PT Home Exercise Plan rest arm   Consulted and Agree with Plan of Care Patient       G-Code:  Clinical judgement:  Carry    Status initial :CK      Goal : CI     Problem List Patient Active Problem List   Diagnosis Date Noted  . Right arm pain 09/03/2015  . Family history of cardiac disorder 03/27/2014  . Hypertension   . Diverticulitis of sigmoid colon s/p robotic colectomy 09/04/2013 08/14/2013  . Hypothyroidism 06/26/2011  . Osteopenia 06/26/2011  . Fibrocystic breast disease 06/26/2011  . Hyperlipidemia 06/26/2011  . Insomnia 06/26/2011    Caprice Red PT 09/04/2015, 11:12 AM  [facility] [address] [place], [place], [address] Phone: [phone]   Fax:  [phone]

## 2015-09-04 NOTE — Patient Instructions (Signed)
Ice 2-3x/day 10 min anterior RT shoulder and to decreased use of RT arm, support RT arm when sitting at rest or using arm when sitting. She should remove tape if irritating skin or in 3-4 days if ok

## 2015-09-16 ENCOUNTER — Ambulatory Visit: Payer: Medicare Other

## 2015-09-16 DIAGNOSIS — R6889 Other general symptoms and signs: Secondary | ICD-10-CM

## 2015-09-16 DIAGNOSIS — M62838 Other muscle spasm: Secondary | ICD-10-CM

## 2015-09-16 DIAGNOSIS — M25521 Pain in right elbow: Secondary | ICD-10-CM

## 2015-09-16 DIAGNOSIS — M79621 Pain in right upper arm: Secondary | ICD-10-CM | POA: Diagnosis not present

## 2015-09-16 NOTE — Therapy (Addendum)
[facility] Outpatient Rehabilitation [facility] [address] [place], [place], [address] Phone: [phone]   Fax:  [phone]  Physical Therapy Treatment  Patient Details  Name: Pamela [name] MRN: [id number] Date of Birth: 1946/04/12 No Data Recorded  Encounter Date: 09/16/2015      PT End of Session - 09/16/15 1811    Visit Number 2   Number of Visits 8   Date for PT Re-Evaluation 10/02/15   PT Start Time 0300   PT Stop Time 0345   PT Time Calculation (min) 45 min   Activity Tolerance Patient tolerated treatment well   Behavior During Therapy [facility] for tasks assessed/performed      Past Medical History  Diagnosis Date  . Diverticulosis   . Osteopenia   . Diverticulitis   . Hyperlipidemia   . Vaginal atrophy   . Vitamin D deficiency   . Fibrocystic breast disease   . Hypothyroidism   . Tubal ectopic pregnancy     x 3  . Colon polyps   . Hypertension     elevated bp in past, no current meds  . Complication of anesthesia 1980's    laparoscopic procedure invitro, [facility] and heart [facility]    Past Surgical History  Procedure Laterality Date  . Wrist fracture surgery Left 1987  . Ectopic pregnancy surgery  1980s    x3  . Fertility surgery      x4 IVF  . Tubal repair  1980's  . Partial colectomy      There were no vitals filed for this visit.  Visit Diagnosis:  Pain in joint, upper arm, right  Activity intolerance  Muscle spasm      Subjective Assessment - 09/16/15 1507    Subjective Over all better but i do things that aggravate the pain. Swept some with pain. Hanging arm causes pain.       Currently in Pain? Yes   Pain Score 1    Pain Location Shoulder   Pain Orientation Right   Pain Descriptors / Indicators Aching   Pain Type Acute pain   Pain Onset More than a month ago   Pain Frequency Intermittent   Aggravating Factors  see above ,    Pain Relieving Factors eds , arm overhead   Multiple Pain Sites No             [facility] PT Assessment - 09/16/15 1512    AROM   Right Shoulder Flexion 1621 Degrees   Right Shoulder ABduction 75 Degrees   Right Shoulder Internal Rotation 60 Degrees  motion that gives most pain   Right Shoulder External Rotation 90 Degrees   Strength   Overall Strength Comments abduction and resited supination gives anterior RT shoulder pain.                      [facility] Adult PT Treatment/Exercise - 09/16/15 1514    Exercises   Exercises Shoulder   Shoulder Exercises: ROM/Strengthening   UBE (Upper Arm Bike) 120 RPM x 5 min   Rhythmic Stabilization, Supine with manual at varoiuus angles   Other ROM/Strengthening Exercises Bilateral rows and shoulder extension red band x 15, ER x15 and IR x12 with red band . RElease of IR caused a more sharp pain anterior RT shoulder.    Other ROM/Strengthening Exercises arm circles x 30 clock and counter clockwise   Shoulder Exercises: Isometric Strengthening   Flexion 5X5"   Extension 5X5"   External Rotation 5X5"  Internal Rotation 5X5"   ABduction 5X5"   Modalities   Modalities Ultrasound;Cryotherapy   Ultrasound   Ultrasound Location anterior RT shoulder    Ultrasound Parameters 100% , 1MHz, 1 Wcm2   Manual Therapy   Manual Therapy Taping   Kinesiotex Inhibit Muscle   Kinesiotix   Inhibit Muscle  Bicep and upper trap/scalenes retaped      Ultrasound was not part of the treatment and she was not charged               PT Long Term Goals - 09/04/15 1102    PT LONG TERM GOAL #1   Title She will be independent with all HEP issued as of last visit   Time 4   Period Weeks   Status New   PT LONG TERM GOAL #2   Title She will report pain improved 50% or more with needle work /crochet   Time 4   Period Weeks   Status New   PT LONG TERM GOAL #3   Title She will report pain decreased 50% with yard work.    Time 4   Period Weeks   Status New   PT LONG TERM GOAL #4   Title She will report 50%  decreased pain with MMT abduction   Time 4   Period Weeks   Status New               Plan - 09/16/15 1812    Clinical Impression Statement She tolerated exercise without increased pian though some specific exercises were pain ful and others not. . Will begin isometrics at hoem next visit and continue tape. Add US   PT Next Visit Plan STW to RT bicep  and traps, ionto if order signed . retape if helful, isometrics for home   Consulted and Agree with Plan of Care Patient        Problem List Patient Active Problem List   Diagnosis Date Noted  . Right arm pain 09/03/2015  . Family history of cardiac disorder 03/27/2014  . Hypertension   . Diverticulitis of sigmoid colon s/p robotic colectomy 09/04/2013 08/14/2013  . Hypothyroidism 06/26/2011  . Osteopenia 06/26/2011  . Fibrocystic breast disease 06/26/2011  . Hyperlipidemia 06/26/2011  . Insomnia 06/26/2011    Caprice Redhasse, Zackaria Burkey M PT 09/16/2015, 6:15 PM  [facility] [address] [place], [place], [address] Phone: [phone]   Fax:  [phone]  Name: Pamela [name] MRN: [id number] Date of Birth: 29-Feb-1946

## 2015-09-17 ENCOUNTER — Ambulatory Visit: Payer: Medicare Other | Admitting: Physical Therapy

## 2015-09-17 ENCOUNTER — Ambulatory Visit: Payer: Medicare Other

## 2015-09-17 DIAGNOSIS — R6889 Other general symptoms and signs: Secondary | ICD-10-CM

## 2015-09-17 DIAGNOSIS — M62838 Other muscle spasm: Secondary | ICD-10-CM

## 2015-09-17 DIAGNOSIS — M79621 Pain in right upper arm: Secondary | ICD-10-CM | POA: Diagnosis not present

## 2015-09-17 DIAGNOSIS — M25521 Pain in right elbow: Secondary | ICD-10-CM

## 2015-09-17 NOTE — Patient Instructions (Addendum)

## 2015-09-17 NOTE — Therapy (Signed)
[facility] Outpatient Rehabilitation [facility] [address] [place], [place], [address] Phone: [phone]   Fax:  [phone]  Physical Therapy Treatment  Patient Details  Name: Pamela [name] MRN: [id number] Date of Birth: 1945-12-15 No Data Recorded  Encounter Date: 09/17/2015      PT End of Session - 09/17/15 1754    Visit Number 3   Number of Visits 8   Date for PT Re-Evaluation 10/02/15   PT Start Time 1505   PT Stop Time 1550   PT Time Calculation (min) 45 min   Activity Tolerance Patient tolerated treatment well;No increased pain   Behavior During Therapy [facility] for tasks assessed/performed      Past Medical History  Diagnosis Date  . Diverticulosis   . Osteopenia   . Diverticulitis   . Hyperlipidemia   . Vaginal atrophy   . Vitamin D deficiency   . Fibrocystic breast disease   . Hypothyroidism   . Tubal ectopic pregnancy     x 3  . Colon polyps   . Hypertension     elevated bp in past, no current meds  . Complication of anesthesia 1980's    laparoscopic procedure invitro, [facility] heart stopped [facility]    Past Surgical History  Procedure Laterality Date  . Wrist fracture surgery Left 1987  . Ectopic pregnancy surgery  1980s    x3  . Fertility surgery      x4 IVF  . Tubal repair  1980's  . Partial colectomy      There were no vitals filed for this visit.  Visit Diagnosis:  Pain in joint, upper arm, right  Activity intolerance  Muscle spasm      Subjective Assessment - 09/17/15 1512    Subjective 3/10 with yardwork   Currently in Pain? Yes   Pain Score 3    Pain Location Shoulder   Pain Orientation Right   Pain Descriptors / Indicators Aching            [facility] PT Assessment - 09/16/15 1512    AROM   Right Shoulder Flexion 1621 Degrees   Right Shoulder ABduction 75 Degrees   Right Shoulder Internal Rotation 60 Degrees  motion that gives most pain   Right Shoulder External Rotation 90 Degrees   Strength    Overall Strength Comments abduction and resited supination gives anterior RT shoulder pain.                      [facility] Adult PT Treatment/Exercise - 09/17/15 1512    Shoulder Exercises: Seated   Other Seated Exercises isometric wrist extension and triceps 10 reps each 5 seconds   Ultrasound   Ultrasound Location --  biceps, anterior shoulder   Ultrasound Parameters 100% 1 watt/cm2   Ultrasound Goals Pain   Iontophoresis   Type of Iontophoresis Dexamethasone   Location shoulder RT   Dose /ml, 1cc   Time 8 minutes, 4 hour patch   Manual Therapy   Manual Therapy Soft tissue mobilization   Manual therapy comments biceps lumpy initially, softened manually using cocco butter.                PT Education - 09/17/15 1753    Education provided Yes   Education Details ionto info, isometrics wrist and triceps extension.   Person(s) Educated Patient   Methods Explanation;Handout   Comprehension Verbalized understanding             PT Long Term Goals -  09/17/15 1759    PT LONG TERM GOAL #1   Title She will be independent with all HEP issued as of last visit   Time 4   Period Weeks   Status On-going   PT LONG TERM GOAL #2   Title She will report pain improved 50% or more with needle work /crochet   Time 4   Period Weeks   Status On-going   PT LONG TERM GOAL #3   Title She will report pain decreased 50% with yard work.    Time 4   Period Weeks   Status On-going   PT LONG TERM GOAL #4   Title She will report 50% decreased pain with MMT abduction   Time 4   Period Weeks   Status Unable to assess               Plan - 09/17/15 1754    Clinical Impression Statement Tape helped shoulder feel supported.  Yard work continued today, she tried to take it [employer].  Knot mid biceps smaller post session, arm edematous when compared to LT   PT Next Visit Plan STW to RT bicep  and traps, ionto - assess .  isometrics for home,  may try shoulder isometrics.    PT Home Exercise Plan triseps and wrist extension isometrics   Consulted and Agree with Plan of Care Patient        Problem List Patient Active Problem List   Diagnosis Date Noted  . Right arm pain 09/03/2015  . Family history of cardiac disorder 03/27/2014  . Hypertension   . Diverticulitis of sigmoid colon s/p robotic colectomy 09/04/2013 08/14/2013  . Hypothyroidism 06/26/2011  . Osteopenia 06/26/2011  . Fibrocystic breast disease 06/26/2011  . Hyperlipidemia 06/26/2011  . Insomnia 06/26/2011    [name] 09/17/2015, 6:02 PM  [facility] [address] [place], [place], [address] Phone: [phone]   Fax:  [phone]  Name: Pamela [name] MRN: [id number] Date of Birth: 1946/07/16    Liz BeachKaren Harlee Pursifull, PTA 09/17/2015 6:02 PM Phone: [phone] Fax: [phone]

## 2015-09-22 ENCOUNTER — Ambulatory Visit: Payer: Medicare Other | Admitting: Physical Therapy

## 2015-09-22 DIAGNOSIS — M25521 Pain in right elbow: Secondary | ICD-10-CM

## 2015-09-22 DIAGNOSIS — R6889 Other general symptoms and signs: Secondary | ICD-10-CM

## 2015-09-22 DIAGNOSIS — M62838 Other muscle spasm: Secondary | ICD-10-CM

## 2015-09-22 DIAGNOSIS — M79621 Pain in right upper arm: Secondary | ICD-10-CM | POA: Diagnosis not present

## 2015-09-22 NOTE — Therapy (Signed)
[facility] Outpatient Rehabilitation [facility] [address] [place], [place], [address] Phone: [phone]   Fax:  [phone]  Physical Therapy Treatment  Patient Details  Name: Pamela [name] MRN: [id number] Date of Birth: Mar 30, 1946 No Data Recorded  Encounter Date: 09/22/2015      PT End of Session - 09/22/15 1532    Visit Number 4   Number of Visits 8   Date for PT Re-Evaluation 10/02/15   PT Start Time 0304   PT Stop Time 0352   PT Time Calculation (min) 48 min      Past Medical History  Diagnosis Date  . Diverticulosis   . Osteopenia   . Diverticulitis   . Hyperlipidemia   . Vaginal atrophy   . Vitamin D deficiency   . Fibrocystic breast disease   . Hypothyroidism   . Tubal ectopic pregnancy     x 3  . Colon polyps   . Hypertension     elevated bp in past, no current meds  . Complication of anesthesia 1980's    laparoscopic procedure invitro, [facility]    Past Surgical History  Procedure Laterality Date  . Wrist fracture surgery Left 1987  . Ectopic pregnancy surgery  1980s    x3  . Fertility surgery      x4 IVF  . Tubal repair  1980's  . Partial colectomy      There were no vitals filed for this visit.  Visit Diagnosis:  Pain in joint, upper arm, right  Activity intolerance  Muscle spasm      Subjective Assessment - 09/22/15 1507    Subjective 10/10 when I saved the dog from falling of the couch onto the floor.    Currently in Pain? Yes   Pain Score 2    Pain Location Arm  bicep   Pain Orientation Right   Aggravating Factors  stirring oatmeal, fine motions, lifting   Pain Relieving Factors meds            [facility] PT Assessment - 09/22/15 1510    ROM / Strength   AROM / PROM / Strength Strength   Strength   Overall Strength Comments supination 4/5 with pain, pronation 4+/5   Strength Assessment Site Elbow;Wrist   Right/Left Elbow Right   Right Elbow Flexion 4+/5   Right Elbow  Extension 4/5   Right/Left Wrist Right;Left                     [facility] Adult PT Treatment/Exercise - 09/22/15 1530    Elbow Exercises   Elbow Flexion Strengthening;Right;10 reps;Theraband   Elbow Flexion Limitations 3 sets    Forearm Supination Strengthening;Right;10 reps;Theraband   Forearm Supination Limitations 3 sets yellow band   Wrist Extension Strengthening;Right;10 reps;Theraband   Wrist Extension Limitations 3 sets yellow band   Ultrasound   Ultrasound Parameters pulsed .8w/cm2 1 mhz x 8 min right bicep   Iontophoresis   Type of Iontophoresis Dexamethasone   Location shoulder RT   Dose /ml, 1cc   Time 8 minutes, 4 hour patch   Manual Therapy   Manual Therapy Soft tissue mobilization   Soft tissue mobilization Soft tissue work to right bicep                PT Education - 09/22/15 1540    Education provided Yes   Education Details resisted yellow band supination flexion and extension   Person(s) Educated Patient   Methods Explanation;Handout  Comprehension Verbalized understanding             PT Long Term Goals - 09/17/15 1759    PT LONG TERM GOAL #1   Title She will be independent with all HEP issued as of last visit   Time 4   Period Weeks   Status On-going   PT LONG TERM GOAL #2   Title She will report pain improved 50% or more with needle work /crochet   Time 4   Period Weeks   Status On-going   PT LONG TERM GOAL #3   Title She will report pain decreased 50% with yard work.    Time 4   Period Weeks   Status On-going   PT LONG TERM GOAL #4   Title She will report 50% decreased pain with MMT abduction   Time 4   Period Weeks   Status Unable to assess               Plan - 09/22/15 1556    Clinical Impression Statement Instructed pt in yellow band HEP for supination, and elbow flexion/extension.No increased pain. Repeated US, manual and Ionto as pt reported this was helpful.    PT Next Visit Plan review yellow   band and assess response, continue modalities.         Problem List Patient Active Problem List   Diagnosis Date Noted  . Right arm pain 09/03/2015  . Family history of cardiac disorder 03/27/2014  . Hypertension   . Diverticulitis of sigmoid colon s/p robotic colectomy 09/04/2013 08/14/2013  . Hypothyroidism 06/26/2011  . Osteopenia 06/26/2011  . Fibrocystic breast disease 06/26/2011  . Hyperlipidemia 06/26/2011  . Insomnia 06/26/2011    Sherrie Mustacheonoho, Frona Yost McGee, PTA 09/22/2015, 3:58 PM  [facility] [address] [place], [place], [address] Phone: [phone]   Fax:  [phone]  Name: Pamela [name] MRN: [id number] Date of Birth: July 10, 1946

## 2015-09-22 NOTE — Patient Instructions (Signed)
Bicep: Reverse Curl (Eccentric), (Resistance Band)    Raise affected arm, palm down, holding resistance band until elbow is bent to 100. Turn palm up. Lower slowly 3-5 seconds. Use ____yellow____ resistance band. _10__ reps per set, __3_ sets per session, _7__ days per week.  Triceps Press: Down    Face anchor in shoulder width stance. Palms down and elbows bent to approximately 90, press band down toward thighs, straightening arms. Repeat _10_ times per set. Do _3_ sets per session. Do 7__ sessions per week. Anchor Height: Over Head   Forearm Supination: Resisted    With right palm down, stabilize forearm on thigh with other hand. Keep tubing to inside of hand and roll palm up as far as possible. Repeat _10___ times per set. Relax. Do _3___ sets per session. Do __3__ sessions per day.  Copyright  VHI. All rights reserved.

## 2015-09-24 ENCOUNTER — Ambulatory Visit: Payer: Medicare Other | Admitting: Physical Therapy

## 2015-09-24 DIAGNOSIS — M79621 Pain in right upper arm: Secondary | ICD-10-CM | POA: Diagnosis not present

## 2015-09-24 DIAGNOSIS — M62838 Other muscle spasm: Secondary | ICD-10-CM

## 2015-09-24 DIAGNOSIS — M25521 Pain in right elbow: Secondary | ICD-10-CM

## 2015-09-24 DIAGNOSIS — R6889 Other general symptoms and signs: Secondary | ICD-10-CM

## 2015-09-24 NOTE — Therapy (Signed)
[facility] Outpatient Rehabilitation [facility] [address] [place], [place], [address] Phone: [phone]   Fax:  [phone]  Physical Therapy Treatment  Patient Details  Name: Genola Yuille MRN: [id number] Date of Birth: [birth year] No Data Recorded  Encounter Date: 09/24/2015      PT End of Session - 09/24/15 1559    Visit Number 5   Number of Visits 8   Date for PT Re-Evaluation 10/02/15   PT Start Time 1516   PT Stop Time 1558   PT Time Calculation (min) 42 min   Activity Tolerance Patient tolerated treatment well;No increased pain   Behavior During Therapy [facility] for tasks assessed/performed      Past Medical History  Diagnosis Date  . Diverticulosis   . Osteopenia   . Diverticulitis   . Hyperlipidemia   . Vaginal atrophy   . Vitamin D deficiency   . Fibrocystic breast disease   . Hypothyroidism   . Tubal ectopic pregnancy     x 3  . Colon polyps   . Hypertension     elevated bp in past, no current meds  . Complication of anesthesia 1980's    laparoscopic procedure invitro, [facility]    Past Surgical History  Procedure Laterality Date  . Wrist fracture surgery Left 1987  . Ectopic pregnancy surgery  1980s    x3  . Fertility surgery      x4 IVF  . Tubal repair  1980's  . Partial colectomy      There were no vitals filed for this visit.  Visit Diagnosis:  Pain in joint, upper arm, right  Activity intolerance  Muscle spasm      Subjective Assessment - 09/24/15 1520    Subjective no pain before session   Currently in Pain? Yes   Pain Score 2    Pain Location Arm  bicep   Pain Orientation Right   Pain Descriptors / Indicators Aching   Pain Type Acute pain   Pain Onset More than a month ago   Pain Frequency Intermittent                         [facility] Adult PT Treatment/Exercise - 09/24/15 1522    Elbow Exercises   Elbow Flexion Strengthening;Right;10 reps;Theraband   Theraband  Level (Elbow Flexion) Level 1 (Yellow)   Elbow Flexion Limitations 3 sets   Elbow Extension Strengthening;Right;10 reps;Theraband   Theraband Level (Elbow Extension) Level 1 (Yellow)   Elbow Extension Limitations 3 sets   Forearm Supination Strengthening;Right;10 reps;Theraband   Theraband Level (Supination) Level 1 (Yellow)   Forearm Supination Limitations 3 sets   Shoulder Exercises: ROM/Strengthening   UBE (Upper Arm Bike) 4 min L 1 (2 min forward/2 min backward); slow RPM   Ultrasound   Ultrasound Location ant R shoulder   Ultrasound Parameters 100%; 1.2 w/cm2 x 8 min   Ultrasound Goals Pain   Iontophoresis   Type of Iontophoresis Dexamethasone   Location shoulder RT   Dose /ml, 1cc   Time 8 minutes, 4 hour patch                     PT Long Term Goals - 09/17/15 1759    PT LONG TERM GOAL #1   Title She will be independent with all HEP issued as of last visit   Time 4   Period Weeks   Status On-going   PT LONG TERM  GOAL #2   Title She will report pain improved 50% or more with needle work /crochet   Time 4   Period Weeks   Status On-going   PT LONG TERM GOAL #3   Title She will report pain decreased 50% with yard work.    Time 4   Period Weeks   Status On-going   PT LONG TERM GOAL #4   Title She will report 50% decreased pain with MMT abduction   Time 4   Period Weeks   Status Unable to assess               Plan - 09/24/15 1559    Clinical Impression Statement Min cues for correct technique with HEP.  No increase in pain with HEP, however some pain with UBE.     PT Next Visit Plan continue modalities, strengthening, manual   Consulted and Agree with Plan of Care Patient        Problem List Patient Active Problem List   Diagnosis Date Noted  . Right arm pain 09/03/2015  . Family history of cardiac disorder 03/27/2014  . Hypertension   . Diverticulitis of sigmoid colon s/p robotic colectomy 09/04/2013 08/14/2013  . Hypothyroidism  06/26/2011  . Osteopenia 06/26/2011  . Fibrocystic breast disease 06/26/2011  . Hyperlipidemia 06/26/2011  . Insomnia 06/26/2011   Clarita CraneStephanie F Nazair Fortenberry, PT, DPT 09/24/2015 4:00 PM  [facility] [address] [place], [place], [address] Phone: [phone]   Fax:  [phone]  Name: Patsy LagerRebecca Heigl MRN: [id number] Date of Birth: February 20, 1946

## 2015-09-29 ENCOUNTER — Ambulatory Visit: Payer: Medicare Other | Admitting: Physical Therapy

## 2015-09-29 DIAGNOSIS — M25521 Pain in right elbow: Secondary | ICD-10-CM

## 2015-09-29 DIAGNOSIS — R6889 Other general symptoms and signs: Secondary | ICD-10-CM

## 2015-09-29 DIAGNOSIS — M79621 Pain in right upper arm: Secondary | ICD-10-CM | POA: Diagnosis not present

## 2015-09-29 DIAGNOSIS — M62838 Other muscle spasm: Secondary | ICD-10-CM

## 2015-09-29 NOTE — Patient Instructions (Signed)
Raise: Forward    Anchor tubing under feet in narrow stance. Palms down, raise arms in front to parallel. Repeat _10_ times per set. Do _1-2_ sets per session. Do 2__ sessions per week.  http://tub.exer.us/100   Copyright  VHI. All rights reserved.  Raise: Lateral    Anchor tubing under feet in narrow stance. Thumbs forward, raise arms out from sides to parallel. Repeat _10_ times per set. Do _1-2_ sets per session. Do _2_ sessions per week.  http://tub.exer.us/102   Copyright  VHI. All rights reserved.

## 2015-09-29 NOTE — Therapy (Signed)
[facility] Outpatient Rehabilitation [facility] [address] [place], [place], [address] Phone: [phone]   Fax:  [phone]  Physical Therapy Treatment  Patient Details  Name: Pamela [name] MRN: [id number] Date of Birth: Nov 26, 1946 No Data Recorded  Encounter Date: 09/29/2015      PT End of Session - 09/29/15 1426    Visit Number 6   Number of Visits 8   Date for PT Re-Evaluation 10/02/15   PT Start Time 0219   PT Stop Time 0303   PT Time Calculation (min) 44 min      Past Medical History  Diagnosis Date  . Diverticulosis   . Osteopenia   . Diverticulitis   . Hyperlipidemia   . Vaginal atrophy   . Vitamin D deficiency   . Fibrocystic breast disease   . Hypothyroidism   . Tubal ectopic pregnancy     x 3  . Colon polyps   . Hypertension     elevated bp in past, no current meds  . Complication of anesthesia 1980's    laparoscopic procedure invitro, [facility]    Past Surgical History  Procedure Laterality Date  . Wrist fracture surgery Left 1987  . Ectopic pregnancy surgery  1980s    x3  . Fertility surgery      x4 IVF  . Tubal repair  1980's  . Partial colectomy      There were no vitals filed for this visit.  Visit Diagnosis:  Pain in joint, upper arm, right  Activity intolerance  Muscle spasm      Subjective Assessment - 09/29/15 1424    Subjective no pain. 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    Currently in Pain? No/denies                         [facility] Adult PT Treatment/Exercise - 09/29/15 1428    Elbow Exercises   Elbow Flexion Right;20 reps;Standing;Bar weights/barbell   Bar Weights/Barbell (Elbow Flexion) 3 lbs   Elbow Extension Right;20 reps   Bar Weights/Barbell (Elbow Extension) 2 lbs   Shoulder Exercises: Standing   Flexion Strengthening;Right;10 reps   Shoulder Flexion Weight (lbs) 1   ABduction 10 reps;Weights   Shoulder ABduction  Weight (lbs) 1   Shoulder Exercises: ROM/Strengthening   UBE (Upper Arm Bike) 2.5 minutes Level 1 forward- backward too painful    Wrist Exercises   Other wrist exercises velcro pad handle turns key turns and wrist flexion and extension 4 passes each.   Ultrasound   Ultrasound Parameters 100% 1.2 w/cm2   Ultrasound Goals Pain   Iontophoresis   Type of Iontophoresis Dexamethasone   Location shoulder RT   Dose 4mg /ml, 1cc   Time 8 minutes, 4 hour patch   Manual Therapy   Soft tissue mobilization Soft tissue work to right bicep IASTYM- rock blade                PT Education - 09/29/15 1455    Education provided Yes   Education Details standing lateral and forward raise; yellow band   Person(s) Educated Patient   Methods Explanation;Handout   Comprehension Verbalized understanding             PT Long Term Goals - 09/29/15 1425    PT LONG TERM GOAL #1   Title She will be independent with all HEP issued as of last visit  Time 4   Period Weeks   Status On-going   PT LONG TERM GOAL #2   Title She will report pain improved 50% or more with needle work /crochet   Time 4   Period Weeks   Status On-going   PT LONG TERM GOAL #3   Title She will report pain decreased 50% with yard work.    Time 4   Period Weeks   Status Achieved   PT LONG TERM GOAL #4   Title She will report 50% decreased pain with MMT abduction   Time 4   Period Weeks   Status Achieved               Plan - 09/29/15 1448    Clinical Impression Statement Pt reports 50% decrease in pain with yardwork. She has no pain with abduction MMT today. LTG#3,#4 MET. Instructed pt in resisted wrist, elbow and shoulder strengthening with modifications as needed to avoid pain. Manual, [place], and ionto repeated as previous.    PT Next Visit Plan continue modalities, strengthening, manual, update HEP , review standing band exercises        Problem List Patient Active Problem List   Diagnosis Date Noted   . Right arm pain 09/03/2015  . Family history of cardiac disorder 03/27/2014  . Hypertension   . Diverticulitis of sigmoid colon s/p robotic colectomy 09/04/2013 08/14/2013  . Hypothyroidism 06/26/2011  . Osteopenia 06/26/2011  . Fibrocystic breast disease 06/26/2011  . Hyperlipidemia 06/26/2011  . Insomnia 06/26/2011    [name], PTA 09/29/2015, 3:03 PM  [name] [address] [place], [place], [address] Phone: [phone]   Fax:  [phone]  Name: Pamela [name] MRN: [id number] Date of Birth: [birth year]

## 2015-10-01 ENCOUNTER — Ambulatory Visit: Payer: Medicare Other | Attending: Internal Medicine | Admitting: Physical Therapy

## 2015-10-01 DIAGNOSIS — M79621 Pain in right upper arm: Secondary | ICD-10-CM | POA: Insufficient documentation

## 2015-10-01 DIAGNOSIS — R6889 Other general symptoms and signs: Secondary | ICD-10-CM | POA: Diagnosis present

## 2015-10-01 DIAGNOSIS — M25521 Pain in right elbow: Secondary | ICD-10-CM

## 2015-10-01 DIAGNOSIS — M62838 Other muscle spasm: Secondary | ICD-10-CM

## 2015-10-01 NOTE — Therapy (Addendum)
[place], [place], [address] Phone: [phone]   Fax:  [phone]  Physical Therapy Treatment  Patient Details  Name: Pamela [name] MRN: [id number] Date of Birth: 08-01-1946 No Data Recorded  Encounter Date: 10/01/2015      PT End of Session - 10/01/15 1706    Visit Number 7   Number of Visits 7   Date for PT Re-Evaluation 10/02/15   PT Start Time 0500   PT Stop Time 8768   PT Time Calculation (min) 45 min   Activity Tolerance Patient tolerated treatment well;No increased pain   Behavior During Therapy [facility] for tasks assessed/performed      Past Medical History  Diagnosis Date  . Diverticulosis   . Osteopenia   . Diverticulitis   . Hyperlipidemia   . Vaginal atrophy   . Vitamin D deficiency   . Fibrocystic breast disease   . Hypothyroidism   . Tubal ectopic pregnancy     x 3  . Colon polyps   . Hypertension     elevated bp in past, no current meds  . Complication of anesthesia 1980's    laparoscopic procedure inv[facility], [facility]    Past Surgical History  Procedure Laterality Date  . Wrist fracture surgery Left 1987  . Ectopic pregnancy surgery  1980s    x3  . Fertility surgery      x4 IVF  . Tubal repair  1980's  . Partial colectomy      There were no vitals filed for this visit.  Visit Diagnosis:  Pain in joint, upper arm, right  Activity intolerance  Muscle spasm      Subjective Assessment - 10/01/15 1659    Subjective I am no pain. I did crochet last night and it didn't hurt I am ready for discharge.   Currently in Pain? No/denies            [facility] PT Assessment - 10/01/15 0001    Strength   Overall Strength Comments supination 4/5 with pain, pronation 4+/5   Right Elbow Flexion 4+/5   Right Elbow Extension 4/5                     [facility] Adult PT Treatment/Exercise - 10/01/15 0001    Elbow Exercises   Elbow Flexion  Strengthening;Right;10 reps;Theraband   Theraband Level (Elbow Flexion) ;Level 3 (Green)   Elbow Flexion Limitations 3 sets 10 reps   Forearm Supination Strengthening;Right;10 reps;Theraband   Theraband Level (Supination) Level 3 (Green)   Forearm Supination Limitations 3 sets 10 reps   Shoulder Exercises: Standing   Flexion Strengthening;Right;10 reps   Shoulder Flexion Weight (lbs) 1   ABduction 10 reps;Weights   Shoulder ABduction Weight (lbs) 1   Shoulder Exercises: ROM/Strengthening   UBE (Upper Arm Bike) 4 min L 1 (2 min forward/2 min backward); slow RPM   Wrist Exercises   Other wrist exercises velcro pad handle turns key turns and wrist flexion and extension 4 passes each.   Iontophoresis   Type of Iontophoresis Dexamethasone   Location shoulder RT   Dose 22m/ml, 1cc   Time 8 minutes, 4 hour patch                     PT Long Term Goals - 10/01/15 1705    PT LONG TERM GOAL #1   Title She will be independent with all HEP issued as of last  visit   Time 4   Period Weeks   Status Achieved   PT LONG TERM GOAL #2   Title She will report pain improved 50% or more with needle work /crochet   Time 4   Period Weeks   Status Achieved   PT LONG TERM GOAL #3   Title She will report pain decreased 50% with yard work.    Time 4   Period Weeks   Status Achieved   PT LONG TERM GOAL #4   Title She will report 50% decreased pain with MMT abduction   Time 4   Period Weeks   Status Achieved     G-Code; PT Carry    Goal  CI   Discharge CI            Clinical judgement          Plan - 10/01/15 1707    Clinical Impression Statement Pt reports no pain at start of treatment and some pain locally in the bicep. She reports iontophoresis patch always resolves her pain after PT.   She is I on final HEP and reported no pain while crocheting so she has now achieved all LTG listed. She is being discharged with [place], and education was provided to continue pain free  strengthening at home. Pain is no longer ilicited on strength tests and all MMT reveal strength is in functional limits (see flow chart)   PT Next Visit Plan this was the last visit and patient was discharged today      During this treatment session, the therapist was present, participating in and directing the treatment.   Problem List Patient Active Problem List   Diagnosis Date Noted  . Right arm pain 09/03/2015  . Family history of cardiac disorder 03/27/2014  . Hypertension   . Diverticulitis of sigmoid colon s/p robotic colectomy 09/04/2013 08/14/2013  . Hypothyroidism 06/26/2011  . Osteopenia 06/26/2011  . Fibrocystic breast disease 06/26/2011  . Hyperlipidemia 06/26/2011  . Insomnia 06/26/2011   Laury Axon, SPTA 10/01/2015 5:09 PM [phone] [phone]  Hessie Diener, PTA 10/02/2015 10:03 AM Phone: [phone] Fax: [facility] [place], [place], [address] Phone: [phone]   Fax:  [phone]  Name: Pamela [name] MRN: [id number] Date of Birth: [birth year]    PHYSICAL THERAPY DISCHARGE SUMMARY  Visits from Start of Care: 7  Current functional level related to goals / functional outcomes: See above   Remaining deficits: See above   Education / Equipment: HEP Plan: Patient agrees to discharge.  Patient goals were not met. Patient is being discharged due to meeting the stated rehab goals.  ?????   Lillette Boxer Chasse PT  10/09/15    11:10 AM

## 2016-05-10 ENCOUNTER — Other Ambulatory Visit: Payer: Self-pay

## 2016-05-10 MED ORDER — LEVOTHYROXINE SODIUM 100 MCG PO TABS: 100.0000 ug | ORAL_TABLET | Freq: Every evening | ORAL | Status: DC

## 2016-05-10 MED ORDER — SIMVASTATIN 10 MG PO TABS: 10.0000 mg | ORAL_TABLET | Freq: Every day | ORAL | Status: DC

## 2016-06-29 ENCOUNTER — Other Ambulatory Visit: Payer: Medicare Other | Admitting: Internal Medicine

## 2016-06-29 DIAGNOSIS — E039 Hypothyroidism, unspecified: Secondary | ICD-10-CM | POA: Diagnosis not present

## 2016-06-29 DIAGNOSIS — I10 Essential (primary) hypertension: Secondary | ICD-10-CM

## 2016-06-29 DIAGNOSIS — E785 Hyperlipidemia, unspecified: Secondary | ICD-10-CM

## 2016-06-29 LAB — CBC WITH DIFFERENTIAL/PLATELET
BASOS ABS: 0 {cells}/uL (ref 0–200)
Basophils Relative: 0 %
EOS ABS: 84 {cells}/uL (ref 15–500)
EOS PCT: 2 %
HCT: 41.2 % (ref 35.0–45.0)
Hemoglobin: 13.5 g/dL (ref 11.7–15.5)
LYMPHS PCT: 40 %
Lymphs Abs: 1680 cells/uL (ref 850–3900)
MCH: 29.6 pg (ref 27.0–33.0)
MCHC: 32.8 g/dL (ref 32.0–36.0)
MCV: 90.4 fL (ref 80.0–100.0)
MONOS PCT: 8 %
MPV: 10.6 fL (ref 7.5–12.5)
Monocytes Absolute: 336 cells/uL (ref 200–950)
NEUTROS ABS: 2100 {cells}/uL (ref 1500–7800)
NEUTROS PCT: 50 %
Platelets: 253 10*3/uL (ref 140–400)
RBC: 4.56 MIL/uL (ref 3.80–5.10)
RDW: 13.5 % (ref 11.0–15.0)
WBC: 4.2 10*3/uL (ref 3.8–10.8)

## 2016-06-29 LAB — LIPID PANEL
CHOL/HDL RATIO: 2.6 ratio (ref <=5.0)
Cholesterol: 196 mg/dL (ref 125–200)
HDL: 74 mg/dL (ref >=46)
LDL Cholesterol: 100 mg/dL (ref <130)
TRIGLYCERIDES: 110 mg/dL (ref <150)
VLDL: 22 mg/dL (ref <30)

## 2016-06-29 LAB — COMPLETE METABOLIC PANEL WITH GFR
ALT: 14 U/L (ref 6–29)
AST: 17 U/L (ref 10–35)
Albumin: 4.3 g/dL (ref 3.6–5.1)
Alkaline Phosphatase: 59 U/L (ref 33–130)
BILIRUBIN TOTAL: 0.7 mg/dL (ref 0.2–1.2)
BUN: 14 mg/dL (ref 7–25)
CO2: 25 mmol/L (ref 20–31)
Calcium: 9.5 mg/dL (ref 8.6–10.4)
Chloride: 101 mmol/L (ref 98–110)
Creat: 0.78 mg/dL (ref 0.50–0.99)
GFR, Est Non African American: 78 mL/min (ref >=60)
Glucose, Bld: 85 mg/dL (ref 65–99)
Potassium: 4.3 mmol/L (ref 3.5–5.3)
SODIUM: 139 mmol/L (ref 135–146)
TOTAL PROTEIN: 6.8 g/dL (ref 6.1–8.1)

## 2016-06-29 LAB — TSH: TSH: 0.32 mIU/L — ABNORMAL LOW

## 2016-06-30 LAB — VITAMIN D 25 HYDROXY (VIT D DEFICIENCY, FRACTURES): VIT D 25 HYDROXY: 28 ng/mL — AB (ref 30–100)

## 2016-07-01 ENCOUNTER — Encounter: Payer: Self-pay | Admitting: Internal Medicine

## 2016-07-01 ENCOUNTER — Ambulatory Visit (INDEPENDENT_AMBULATORY_CARE_PROVIDER_SITE_OTHER): Payer: Medicare Other | Admitting: Internal Medicine

## 2016-07-01 VITALS — BP 142/86 | HR 74 | Temp 97.5°F | Ht 62.0 in | Wt 139.0 lb

## 2016-07-01 DIAGNOSIS — M8580 Other specified disorders of bone density and structure, unspecified site: Secondary | ICD-10-CM

## 2016-07-01 DIAGNOSIS — E785 Hyperlipidemia, unspecified: Secondary | ICD-10-CM | POA: Diagnosis not present

## 2016-07-01 DIAGNOSIS — Z9049 Acquired absence of other specified parts of digestive tract: Secondary | ICD-10-CM

## 2016-07-01 DIAGNOSIS — E559 Vitamin D deficiency, unspecified: Secondary | ICD-10-CM | POA: Diagnosis not present

## 2016-07-01 DIAGNOSIS — G4700 Insomnia, unspecified: Secondary | ICD-10-CM | POA: Diagnosis not present

## 2016-07-01 DIAGNOSIS — I10 Essential (primary) hypertension: Secondary | ICD-10-CM | POA: Diagnosis not present

## 2016-07-01 DIAGNOSIS — Z0000 Encounter for general adult medical examination without abnormal findings: Secondary | ICD-10-CM

## 2016-07-01 LAB — POCT URINALYSIS DIPSTICK
BILIRUBIN UA: NEGATIVE
GLUCOSE UA: NEGATIVE
Ketones, UA: NEGATIVE
Leukocytes, UA: NEGATIVE
NITRITE UA: NEGATIVE
Protein, UA: NEGATIVE
Spec Grav, UA: 1.02
Urobilinogen, UA: 0.2
pH, UA: 5

## 2016-07-18 NOTE — Progress Notes (Signed)
Subjective:    Patient ID: Pamela [name], female    DOB: [birth year], 70 y.o.   MRN: [id number]  HPI Pleasant [name]10 year old White Female in today for health maintenance exam and evaluation of medical issues.   Patient has a history of hyperlipidemia currently on low-dose statin medication. History of osteopenia, fibrocystic breast disease, diverticulitis of the colon status post laparoscopic colon resection, hypothyroidism, insomnia.  Patient was having recurrent bouts of diverticulitis that were getting much more frequent and was evaluated by surgeon who recommended colon resection and she is done well since that time.  History positive ANA with titer of 1:160 with no evidence of SLE seen by rheumatologist in 1996. Was diagnosed with a fibromyalgia-type complex at that time.  Had colonoscopy by Dr. Juanda ChanceBrodie June 2013. Tetanus immunization 2011. Zostavax vaccine 2012.  Hypothyroid since 1993.  Left wrist fracture 1987 had to be repaired surgically. Has had recurrent urinary infections on occasion.  History of ectopic pregnancies 1982, 1983, 1985 resulting in laparotomy each time.  Patient had tonsillectomy in the 1950s. In 1994 she had a tubal repair laparotomy.  Bone density study July 2012 showed a T score in LS spine of -1.7 and a T score in the femoral neck of -2.1. Patient does not want to be on bisphosphonate medication. She takes vitamin D although she admits she doesn't take it as regularly as she should.  Social history: She is retired. 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 No history of smoking. Occasional wine consumption. She is married.  Family history: Father died in 2001 with history of coronary artery disease, CVA, MI diabetes. Mother died at age 68 in [id number] with history of hypertension and lung cancer. 3 brothers and one sister in good health. No children. Patient had a cousin died of sudden cardiac arrhythmia due to right ventricular dysplasia/cardiomyopathy.  [facility] and was screened for that condition and was found not to have the condition. Several family members have tested positive for the gene.      Review of Systems  Constitutional: Negative.   All other systems reviewed and are negative.      Objective:   Physical Exam  Constitutional: She is oriented to person, place, and time. She appears well-developed and well-nourished. No distress.  HENT:  Head: Normocephalic and atraumatic.  Right Ear: External ear normal.  Left Ear: External ear normal.  Mouth/Throat: Oropharynx is clear and moist.  Eyes: Conjunctivae and EOM are normal. Pupils are equal, round, and reactive to light. Right eye exhibits no discharge. Left eye exhibits no discharge. No scleral icterus.  Neck: Neck supple. No JVD present. No thyromegaly present.  Cardiovascular: Normal rate, regular rhythm, normal heart sounds and intact distal pulses.   No murmur heard. Pulmonary/Chest: Effort normal and breath sounds normal. No respiratory distress. She has no wheezes. She has no rales.  Abdominal: Soft. Bowel sounds are normal. She exhibits no distension and no mass. There is no tenderness. There is no rebound and no guarding.  Genitourinary:  Genitourinary Comments: Pap taken March 2015. Bimanual normal. Currently Pap being deferred after age [age] being deferred after age 70  Musculoskeletal: She exhibits no edema.  Lymphadenopathy:    She has no cervical adenopathy.  Neurological: She is alert and oriented to person, place, and time. She has normal reflexes. No cranial nerve deficit. Coordination normal.  Skin: Skin is warm and dry. No rash noted. She is not diaphoretic.  Psychiatric:  She has a normal mood and affect. Her behavior is normal. Judgment and thought content normal.  Vitals reviewed.         Assessment & Plan:  Hypothyroidism-TSH is low on current dose of thyroid replacement therapy but she feels well. We'll continue to  monitor.  Osteopenia-does not want to be on bisphosphonate medication  Vitamin D deficiency-reminded to take 2000 units vitamin D 3 daily  History of insomnia-refill Ambien  History of recurrent diverticulitis status post laparoscopic colon resection 2004  History of hyperlipidemia-stable on current dose of lipid-lowering medication  History of fibrocystic breast disease  Essential hypertension-stable  Plan: Return in 6 months to one year or as needed.  Subjective:   Patient presents for Medicare Annual/Subsequent preventive examination.  Review Past Medical/Family/Social:See above   Risk Factors  Current exercise habits: Gardening Dietary issues discussed: Low fat low carbohydrate recommended  Cardiac risk factors:Hyperlipidemia, family history  Depression Screen  (Note: if answer to either of the following is "Yes", a more complete depression screening is indicated)   Over the past two weeks, have you felt down, depressed or hopeless? No  Over the past two weeks, have you felt little interest or pleasure in doing things? No Have you lost interest or pleasure in daily life? No Do you often feel hopeless? No Do you cry easily over simple problems? No   Activities of Daily Living  In your present state of health, do you have any difficulty performing the following activities?:   Driving? No  Managing money? No  Feeding yourself? No  Getting from bed to chair? No  Climbing a flight of stairs? No  Preparing food and eating?: No  Bathing or showering? No  Getting dressed: No  Getting to the toilet? No  Using the toilet:No  Moving around from place to place: No  In the past year have you fallen or had a near fall?:No  Are you sexually active? No  Do you have more than one partner? No   Hearing Difficulties: No  Do you often ask people to speak up or repeat themselves? No  Do you experience ringing or noises in your ears? No  Do you have difficulty understanding  soft or whispered voices? No  Do you feel that you have a problem with memory? No Do you often misplace items? No    Home Safety:  Do you have a smoke alarm at your residence? Yes Do you have grab bars in the bathroom?Yes Do you have throw rugs in your house? Yes   Cognitive Testing  Alert? Yes Normal Appearance?Yes  Oriented to person? Yes Place? Yes  Time? Yes  Recall of three objects? Yes  Can perform simple calculations? Yes  Displays appropriate judgment?Yes  Can read the correct time from a watch face?Yes   List the Names of Other Physician/Practitioners you currently use:  See referral list for the physicians patient is currently seeing.     Review of Systems: See above   Objective:     General appearance: Appears stated age and mildly obese  Head: Normocephalic, without obvious abnormality, atraumatic  Eyes: conj clear, EOMi PEERLA  Ears: normal TM's and external ear canals both ears  Nose: Nares normal. Septum midline. Mucosa normal. No drainage or sinus tenderness.  Throat: lips, mucosa, and tongue normal; teeth and gums normal  Neck: no adenopathy, no carotid bruit, no JVD, supple, symmetrical, trachea midline and thyroid not enlarged, symmetric, no tenderness/mass/nodules  No CVA tenderness.  Lungs: clear  to auscultation bilaterally  Breasts: normal appearance, no masses or tenderness   Heart: regular rate and rhythm, S1, S2 normal, no murmur, click, rub or gallop  Abdomen: soft, non-tender; bowel sounds normal; no masses, no organomegaly  Musculoskeletal: ROM normal in all joints, no crepitus, no deformity, Normal muscle strengthen. Back  is symmetric, no curvature. Skin: Skin color, texture, turgor normal. No rashes or lesions  Lymph nodes: Cervical, supraclavicular, and axillary nodes normal.  Neurologic: CN 2 -12 Normal, Normal symmetric reflexes. Normal coordination and gait  Psych: Alert & Oriented x 3, Mood appear stable.    Assessment:    Annual  wellness medicare exam   Plan:    During the course of the visit the patient was educated and counseled about appropriate screening and preventive services including:   Annual mammogram  Recommend annual flu vaccine     Patient Instructions (the written plan) was given to the patient.  Medicare Attestation  I have personally reviewed:  The patient's medical and social history  Their use of alcohol, tobacco or illicit drugs  Their current medications and supplements  The patient's functional ability including ADLs,fall risks, home safety risks, cognitive, and hearing and visual impairment  Diet and physical activities  Evidence for depression or mood disorders  The patient's weight, height, BMI, and visual acuity have been recorded in the chart. I have made referrals, counseling, and provided education to the patient based on review of the above and I have provided the patient with a written personalized care plan for preventive services.

## 2016-07-21 NOTE — Patient Instructions (Signed)
Please take vitamin D daily. Continue same medications and return in 6-12 months. Have annual mammogram.

## 2016-08-06 ENCOUNTER — Other Ambulatory Visit: Payer: Self-pay | Admitting: Internal Medicine

## 2016-08-06 NOTE — Telephone Encounter (Signed)
Refill x 6 months 

## 2016-08-11 DIAGNOSIS — L720 Epidermal cyst: Secondary | ICD-10-CM | POA: Diagnosis not present

## 2016-08-11 DIAGNOSIS — L821 Other seborrheic keratosis: Secondary | ICD-10-CM | POA: Diagnosis not present

## 2016-09-03 DIAGNOSIS — Z23 Encounter for immunization: Secondary | ICD-10-CM | POA: Diagnosis not present

## 2016-12-09 ENCOUNTER — Other Ambulatory Visit: Payer: Self-pay | Admitting: Internal Medicine

## 2016-12-09 DIAGNOSIS — Z1231 Encounter for screening mammogram for malignant neoplasm of breast: Secondary | ICD-10-CM

## 2016-12-17 ENCOUNTER — Ambulatory Visit (INDEPENDENT_AMBULATORY_CARE_PROVIDER_SITE_OTHER): Payer: Medicare Other | Admitting: Podiatry

## 2016-12-17 ENCOUNTER — Encounter: Payer: Self-pay | Admitting: Podiatry

## 2016-12-17 ENCOUNTER — Ambulatory Visit (INDEPENDENT_AMBULATORY_CARE_PROVIDER_SITE_OTHER): Payer: Medicare Other

## 2016-12-17 VITALS — BP 164/104 | HR 68 | Resp 14

## 2016-12-17 DIAGNOSIS — M779 Enthesopathy, unspecified: Secondary | ICD-10-CM

## 2016-12-17 DIAGNOSIS — M2041 Other hammer toe(s) (acquired), right foot: Secondary | ICD-10-CM

## 2016-12-17 DIAGNOSIS — M2042 Other hammer toe(s) (acquired), left foot: Secondary | ICD-10-CM

## 2016-12-17 NOTE — Progress Notes (Signed)
   Subjective:    Patient ID: Pamela [name], female    DOB: [birth year], 71 y.o.   MRN: [id number]  HPI The patient presents here today with B/L feet pain especially when walking. The left foot 2nd,3rd, 4th toes curving inwards since 1year ago.    Review of Systems     Objective:   Physical Exam        Assessment & Plan:

## 2016-12-19 NOTE — Progress Notes (Signed)
Subjective:     Patient ID: Pamela [name], female   DOB: 11/18/1946, 71 y.o.   MRN: [id number]  HPI patient presents with chronic forefoot pain bilateral stating that her toes seem to be curling worse right over left and it's starting to get in the way with her being active   Review of Systems  All other systems reviewed and are negative.      Objective:   Physical Exam  Constitutional: She is oriented to person, place, and time.  Cardiovascular: Intact distal pulses.   Musculoskeletal: Normal range of motion.  Neurological: She is oriented to person, place, and time.  Skin: Skin is warm.  Nursing note and vitals reviewed.  neurovascular status intact muscle strength is adequate range of motion within normal limits with patient found to have contracted digits of the lesser toes right over left with fluid buildup around the metatarsal phalangeal joint. There is diminishment of the fat pad and it has moved in a distal direction creating more exposure to bone and patient's found to have good digital perfusion and is well oriented 3     Assessment:     Inflammatory changes secondary to rigid contraction of lesser digits with inflammation around the metatarsal phalangeal joints bilateral    Plan:     H&P conditions reviewed and recommended a soft type orthotics to reduce plantar pressure. 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

## 2016-12-29 ENCOUNTER — Ambulatory Visit: Payer: Medicare Other

## 2016-12-29 DIAGNOSIS — M779 Enthesopathy, unspecified: Secondary | ICD-10-CM

## 2016-12-29 NOTE — Patient Instructions (Signed)

## 2016-12-31 DIAGNOSIS — H2513 Age-related nuclear cataract, bilateral: Secondary | ICD-10-CM | POA: Diagnosis not present

## 2017-01-04 ENCOUNTER — Ambulatory Visit
Admission: RE | Admit: 2017-01-04 | Discharge: 2017-01-04 | Disposition: A | Discharge status: Home / Self Care | Payer: Medicare Other | Source: Ambulatory Visit | Attending: Internal Medicine | Admitting: Internal Medicine

## 2017-01-04 DIAGNOSIS — Z1231 Encounter for screening mammogram for malignant neoplasm of breast: Secondary | ICD-10-CM

## 2017-01-06 ENCOUNTER — Other Ambulatory Visit: Payer: Medicare Other | Admitting: Internal Medicine

## 2017-01-06 DIAGNOSIS — E039 Hypothyroidism, unspecified: Secondary | ICD-10-CM

## 2017-01-06 LAB — TSH: TSH: 0.43 mIU/L

## 2017-01-07 NOTE — Progress Notes (Signed)
Patient presents for orthotic pick up.  Verbal and written break in and wear instructions given.  Patient will follow up in 4 weeks if symptoms worsen or fail to improve. 

## 2017-02-05 ENCOUNTER — Other Ambulatory Visit: Payer: Self-pay | Admitting: Internal Medicine

## 2017-02-06 NOTE — Telephone Encounter (Signed)
Refill x 6 months. Book CPE after August 3rd

## 2017-03-24 ENCOUNTER — Other Ambulatory Visit: Payer: Self-pay

## 2017-03-24 NOTE — Telephone Encounter (Signed)
It was checked in February 2018. cpe and labs are scheduled. Any refills on the Ambien?

## 2017-03-24 NOTE — Telephone Encounter (Signed)
Her TSH was low in August. She should come have it checked. Needs CPE after 8/3. Needs to book before refilling Ambien.

## 2017-03-24 NOTE — Telephone Encounter (Signed)
Patient called back to schedule CPE and labs in August. She's requesting a refill on AMBIEN she said she's traveling next week and she likes to keep some on hand. Ok to refill?

## 2017-03-25 MED ORDER — ZOLPIDEM TARTRATE 5 MG PO TABS: 5.0000 mg | ORAL_TABLET | Freq: Every evening | ORAL | 0 refills | Status: DC | PRN

## 2017-05-30 DIAGNOSIS — H5032 Intermittent alternating esotropia: Secondary | ICD-10-CM | POA: Diagnosis not present

## 2017-07-05 ENCOUNTER — Other Ambulatory Visit: Payer: Medicare Other | Admitting: Internal Medicine

## 2017-07-07 ENCOUNTER — Other Ambulatory Visit: Payer: Medicare Other | Admitting: Internal Medicine

## 2017-07-07 DIAGNOSIS — Z0000 Encounter for general adult medical examination without abnormal findings: Secondary | ICD-10-CM

## 2017-07-07 DIAGNOSIS — M8580 Other specified disorders of bone density and structure, unspecified site: Secondary | ICD-10-CM

## 2017-07-07 DIAGNOSIS — E039 Hypothyroidism, unspecified: Secondary | ICD-10-CM | POA: Diagnosis not present

## 2017-07-07 DIAGNOSIS — I10 Essential (primary) hypertension: Secondary | ICD-10-CM | POA: Diagnosis not present

## 2017-07-07 LAB — COMPLETE METABOLIC PANEL WITH GFR
ALK PHOS: 55 U/L (ref 33–130)
ALT: 15 U/L (ref 6–29)
AST: 18 U/L (ref 10–35)
Albumin: 4.3 g/dL (ref 3.6–5.1)
BILIRUBIN TOTAL: 0.6 mg/dL (ref 0.2–1.2)
BUN: 15 mg/dL (ref 7–25)
CO2: 24 mmol/L (ref 20–32)
CREATININE: 0.8 mg/dL (ref 0.50–0.99)
Calcium: 9.3 mg/dL (ref 8.6–10.4)
Chloride: 104 mmol/L (ref 98–110)
GFR, EST AFRICAN AMERICAN: 87 mL/min (ref >=60)
GFR, Est Non African American: 75 mL/min (ref >=60)
Glucose, Bld: 90 mg/dL (ref 65–99)
Potassium: 4.5 mmol/L (ref 3.5–5.3)
Sodium: 139 mmol/L (ref 135–146)
TOTAL PROTEIN: 6.7 g/dL (ref 6.1–8.1)

## 2017-07-07 LAB — LIPID PANEL
CHOL/HDL RATIO: 2.6 ratio (ref <5.0)
CHOLESTEROL: 182 mg/dL (ref <200)
HDL: 71 mg/dL (ref >50)
LDL Cholesterol: 92 mg/dL (ref <100)
Triglycerides: 94 mg/dL (ref <150)
VLDL: 19 mg/dL (ref <30)

## 2017-07-07 LAB — CBC WITH DIFFERENTIAL/PLATELET
BASOS PCT: 0 %
Basophils Absolute: 0 cells/uL (ref 0–200)
EOS ABS: 76 {cells}/uL (ref 15–500)
Eosinophils Relative: 2 %
HEMATOCRIT: 42.3 % (ref 35.0–45.0)
Hemoglobin: 14.1 g/dL (ref 11.7–15.5)
Lymphocytes Relative: 37 %
Lymphs Abs: 1406 cells/uL (ref 850–3900)
MCH: 30.1 pg (ref 27.0–33.0)
MCHC: 33.3 g/dL (ref 32.0–36.0)
MCV: 90.2 fL (ref 80.0–100.0)
MONOS PCT: 9 %
MPV: 10.5 fL (ref 7.5–12.5)
Monocytes Absolute: 342 cells/uL (ref 200–950)
NEUTROS ABS: 1976 {cells}/uL (ref 1500–7800)
Neutrophils Relative %: 52 %
PLATELETS: 250 10*3/uL (ref 140–400)
RBC: 4.69 MIL/uL (ref 3.80–5.10)
RDW: 13.4 % (ref 11.0–15.0)
WBC: 3.8 10*3/uL (ref 3.8–10.8)

## 2017-07-07 LAB — TSH: TSH: 0.17 m[IU]/L — AB

## 2017-07-08 ENCOUNTER — Encounter: Payer: Self-pay | Admitting: Internal Medicine

## 2017-07-08 ENCOUNTER — Ambulatory Visit (INDEPENDENT_AMBULATORY_CARE_PROVIDER_SITE_OTHER): Payer: Medicare Other | Admitting: Internal Medicine

## 2017-07-08 VITALS — BP 112/70 | HR 70 | Temp 98.4°F | Ht 61.5 in | Wt 137.0 lb

## 2017-07-08 DIAGNOSIS — M8580 Other specified disorders of bone density and structure, unspecified site: Secondary | ICD-10-CM | POA: Diagnosis not present

## 2017-07-08 DIAGNOSIS — Z0000 Encounter for general adult medical examination without abnormal findings: Secondary | ICD-10-CM

## 2017-07-08 DIAGNOSIS — Z9049 Acquired absence of other specified parts of digestive tract: Secondary | ICD-10-CM | POA: Diagnosis not present

## 2017-07-08 DIAGNOSIS — E7849 Other hyperlipidemia: Secondary | ICD-10-CM

## 2017-07-08 DIAGNOSIS — I10 Essential (primary) hypertension: Secondary | ICD-10-CM | POA: Diagnosis not present

## 2017-07-08 DIAGNOSIS — G4700 Insomnia, unspecified: Secondary | ICD-10-CM

## 2017-07-08 DIAGNOSIS — E784 Other hyperlipidemia: Secondary | ICD-10-CM

## 2017-07-08 DIAGNOSIS — E559 Vitamin D deficiency, unspecified: Secondary | ICD-10-CM

## 2017-07-08 LAB — POCT URINALYSIS DIPSTICK
Bilirubin, UA: NEGATIVE
GLUCOSE UA: NEGATIVE
Ketones, UA: NEGATIVE
LEUKOCYTES UA: NEGATIVE
NITRITE UA: NEGATIVE
Protein, UA: NEGATIVE
Spec Grav, UA: 1.025 (ref 1.010–1.025)
UROBILINOGEN UA: 0.2 U/dL
pH, UA: 6 (ref 5.0–8.0)

## 2017-07-08 LAB — VITAMIN D 25 HYDROXY (VIT D DEFICIENCY, FRACTURES): VIT D 25 HYDROXY: 27 ng/mL — AB (ref 30–100)

## 2017-07-08 MED ORDER — LEVOTHYROXINE SODIUM 88 MCG PO TABS: 88.0000 ug | ORAL_TABLET | Freq: Every day | ORAL | 3 refills | Status: DC

## 2017-07-08 NOTE — Progress Notes (Signed)
Subjective:    Patient ID: Pamela [name], female    DOB: 1946-05-15, 71 y.o.   MRN: [id number]  HPI 71 year old Female for health maintenance exam and evaluation of medical issues.  TSH is low on Levothyrpoxine 0.1 mg daily.Long-standing history of hypothyroidism.  History of hyperlipidemia currently a low-dose statin medication. History of osteopenia, fibrocystic breast disease, diverticulosis of the colon status post laparoscopic colon resection, insomnia.  History of positive ANA with titer of 1:160 with no evidence of SLE seen by rheumatologist in 04/25/1995. 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  Colonoscopy by Dr. Juanda Chance June 2013. Tetanus immunization 04-24-2010. Zostavax vaccine 04-25-2011.  Hypothyroid since 24-Apr-1992  Left wrist fracture 1987 had to be repaired surgically. Has had recurrent urinary infections on occasion.  History of ectopic pregnancies 1982, 1983, Apr 24, 1984 resulting in laparotomy each time  Patient had tonsillectomy 1950s.  In 04/24/1993 she had a tubal repair laparotomy  Bone density study in 04/25/2011 showed T score in LS spine of -1.7 and in the femoral neck of -2.1. She does not want to be on bisphosphonate medication. She says she takes vitamin D but not as regularly as she should.  Social history: She is retired. 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 No history of smoking. Occasional wine consumption. She is married.  Family history: Father died in 2000-04-24 with history of coronary artery disease, CVA, MI, diabetes. Mother died at age 54 in 2 with history of hypertension and lung cancer. 3 brothers and one sister in good health. No children. Patient had a cousin who died of sudden cardiac arrhythmia due to right ventricular dysplasia/cardiomyopathy. Patient went to [facility] and was screened for that condition and was found not to have it. Several family members have tested positive for the gene.          Review of Systems    Constitutional: Negative.   All other systems reviewed and are negative.      Objective:   Physical Exam  Constitutional: She is oriented to person, place, and time. She appears well-developed and well-nourished. No distress.  HENT:  Head: Normocephalic and atraumatic.  Right Ear: External ear normal.  Left Ear: External ear normal.  Mouth/Throat: Oropharynx is clear and moist. No oropharyngeal exudate.  Eyes: Pupils are equal, round, and reactive to light. Conjunctivae and EOM are normal. Right eye exhibits no discharge. Left eye exhibits no discharge. No scleral icterus.  Neck: Normal range of motion. Neck supple. No JVD present. No thyromegaly present.  Cardiovascular: Normal rate, regular rhythm and normal heart sounds.   No murmur heard. Pulmonary/Chest: Effort normal and breath sounds normal. No respiratory distress. She has no wheezes. She has no rales. She exhibits no tenderness.  Abdominal: Soft. Bowel sounds are normal. She exhibits no distension and no mass. There is no tenderness. There is no rebound and no guarding.  Genitourinary:  Genitourinary Comments: Pap taken March 2015. Bimanual normal. Currently Is being deferred after age 85.  Musculoskeletal: She exhibits no edema.  Neurological: She is alert and oriented to person, place, and time. She has normal reflexes. No cranial nerve deficit. Coordination normal.  Skin: Skin is warm and dry. No rash noted. She is not diaphoretic.  Psychiatric: She has a normal mood and affect.  Vitals reviewed.         Assessment & Plan:  Hypothyroidism-change thyroid replacement 0.088 mg daily and follow-up in 6-8 weeks  Vitamin D deficiency-take  2000 units vitamin D 3 daily  History of hyperlipidemia-lipid panel currently normal  History of fibrocystic breast disease  History of insomnia-refill Ambien  Osteopenia-does not want to be on bisphosphonate medication  Essential hypertension-stable  Plan: Return in 6 months  or as needed  Subjective:   Patient presents for Medicare Annual/Subsequent preventive examination.  Review Past Medical/Family/Social:See above   Risk Factors  Current exercise habits: Active in yard work Dietary issues discussed: Low fat low carbohydrate  Cardiac risk factors:Family history  Depression Screen  (Note: if answer to either of the following is "Yes", a more complete depression screening is indicated)   Over the past two weeks, have you felt down, depressed or hopeless? No  Over the past two weeks, have you felt little interest or pleasure in doing things? No Have you lost interest or pleasure in daily life? No Do you often feel hopeless? No Do you cry easily over simple problems? No   Activities of Daily Living  In your present state of health, do you have any difficulty performing the following activities?:   Driving? No  Managing money? No  Feeding yourself? No  Getting from bed to chair? No  Climbing a flight of stairs? No  Preparing food and eating?: No  Bathing or showering? No  Getting dressed: No  Getting to the toilet? No  Using the toilet:No  Moving around from place to place: No  In the past year have you fallen or had a near fall?:No  Are you sexually active? No  Do you have more than one partner? No   Hearing Difficulties: No  Do you often ask people to speak up or repeat themselves? No  Do you experience ringing or noises in your ears? No  Do you have difficulty understanding soft or whispered voices? No  Do you feel that you have a problem with memory? No Do you often misplace items? No    Home Safety:  Do you have a smoke alarm at your residence? Yes Do you have grab bars in the bathroom?Yes    Cognitive Testing  Alert? Yes Normal Appearance?Yes  Oriented to person? Yes Place? Yes  Time? Yes  Recall of three objects? Yes  Can perform simple calculations? Yes  Displays appropriate judgment?Yes  Can read the correct time from a  watch face?Yes   List the Names of Other Physician/Practitioners you currently use:  See referral list for the physicians patient is currently seeing.     Review of Systems: See above   Objective:     General appearance: Appears stated age and mildly obese  Head: Normocephalic, without obvious abnormality, atraumatic  Eyes: conj clear, EOMi PEERLA  Ears: normal TM's and external ear canals both ears  Nose: Nares normal. Septum midline. Mucosa normal. No drainage or sinus tenderness.  Throat: lips, mucosa, and tongue normal; teeth and gums normal  Neck: no adenopathy, no carotid bruit, no JVD, supple, symmetrical, trachea midline and thyroid not enlarged, symmetric, no tenderness/mass/nodules  No CVA tenderness.  Lungs: clear to auscultation bilaterally  Breasts: normal appearance, no masses or tenderness Heart: regular rate and rhythm, S1, S2 normal, no murmur, click, rub or gallop  Abdomen: soft, non-tender; bowel sounds normal; no masses, no organomegaly  Musculoskeletal: ROM normal in all joints, no crepitus, no deformity, Normal muscle strengthen. Back  is symmetric, no curvature. Skin: Skin color, texture, turgor normal. No rashes or lesions  Lymph nodes: Cervical, supraclavicular, and axillary nodes normal.  Neurologic: CN 2 -  12 Normal, Normal symmetric reflexes. Normal coordination and gait  Psych: Alert & Oriented x 3, Mood appear stable.    Assessment:    Annual wellness medicare exam   Plan:    During the course of the visit the patient was educated and counseled about appropriate screening and preventive services including:   Annual mammogram  Recommend repeat bone density study every 2-3 years  Recommend annual flu vaccine     Patient Instructions (the written plan) was given to the patient.  Medicare Attestation  I have personally reviewed:  The patient's medical and social history  Their use of alcohol, tobacco or illicit drugs  Their current  medications and supplements  The patient's functional ability including ADLs,fall risks, home safety risks, cognitive, and hearing and visual impairment  Diet and physical activities  Evidence for depression or mood disorders  The patient's weight, height, BMI, and visual acuity have been recorded in the chart. I have made referrals, counseling, and provided education to the patient based on review of the above and I have provided the patient with a written personalized care plan for preventive services.

## 2017-07-29 NOTE — Patient Instructions (Addendum)
Reduced dose of thyroid replacement 0.088 mg daily and follow-up September 18

## 2017-08-16 ENCOUNTER — Other Ambulatory Visit: Payer: Medicare Other | Admitting: Internal Medicine

## 2017-08-16 ENCOUNTER — Telehealth: Payer: Self-pay | Admitting: Internal Medicine

## 2017-08-16 DIAGNOSIS — E039 Hypothyroidism, unspecified: Secondary | ICD-10-CM

## 2017-08-16 LAB — TSH: TSH: 0.77 mIU/L (ref 0.40–4.50)

## 2017-08-16 LAB — EXTRA LAV TOP TUBE

## 2017-08-16 MED ORDER — SIMVASTATIN 10 MG PO TABS: ORAL_TABLET | ORAL | 3 refills | Status: DC

## 2017-08-16 NOTE — Telephone Encounter (Signed)
Please refill x one year 

## 2017-08-16 NOTE — Telephone Encounter (Signed)
Done

## 2017-08-16 NOTE — Telephone Encounter (Signed)
Patient would like the following medication refilled: Simvastatin  tablets

## 2017-09-29 ENCOUNTER — Emergency Department (HOSPITAL_COMMUNITY): Payer: Medicare Other

## 2017-09-29 ENCOUNTER — Encounter (HOSPITAL_COMMUNITY): Payer: Self-pay

## 2017-09-29 ENCOUNTER — Emergency Department (HOSPITAL_COMMUNITY)
Admission: EM | Admit: 2017-09-29 | Discharge: 2017-09-29 | Disposition: A | Discharge status: Home / Self Care | Payer: Medicare Other | Attending: Emergency Medicine | Admitting: Emergency Medicine

## 2017-09-29 DIAGNOSIS — S52611A Displaced fracture of right ulna styloid process, initial encounter for closed fracture: Secondary | ICD-10-CM | POA: Diagnosis not present

## 2017-09-29 DIAGNOSIS — Z79899 Other long term (current) drug therapy: Secondary | ICD-10-CM | POA: Insufficient documentation

## 2017-09-29 DIAGNOSIS — I10 Essential (primary) hypertension: Secondary | ICD-10-CM | POA: Diagnosis not present

## 2017-09-29 DIAGNOSIS — S52571A Other intraarticular fracture of lower end of right radius, initial encounter for closed fracture: Secondary | ICD-10-CM

## 2017-09-29 DIAGNOSIS — E785 Hyperlipidemia, unspecified: Secondary | ICD-10-CM | POA: Insufficient documentation

## 2017-09-29 DIAGNOSIS — S52501A Unspecified fracture of the lower end of right radius, initial encounter for closed fracture: Secondary | ICD-10-CM | POA: Diagnosis not present

## 2017-09-29 DIAGNOSIS — Y9301 Activity, walking, marching and hiking: Secondary | ICD-10-CM | POA: Insufficient documentation

## 2017-09-29 DIAGNOSIS — Y929 Unspecified place or not applicable: Secondary | ICD-10-CM | POA: Insufficient documentation

## 2017-09-29 DIAGNOSIS — E039 Hypothyroidism, unspecified: Secondary | ICD-10-CM | POA: Insufficient documentation

## 2017-09-29 DIAGNOSIS — Y998 Other external cause status: Secondary | ICD-10-CM | POA: Diagnosis not present

## 2017-09-29 DIAGNOSIS — W010XXA Fall on same level from slipping, tripping and stumbling without subsequent striking against object, initial encounter: Secondary | ICD-10-CM | POA: Insufficient documentation

## 2017-09-29 DIAGNOSIS — S6991XA Unspecified injury of right wrist, hand and finger(s), initial encounter: Secondary | ICD-10-CM | POA: Diagnosis present

## 2017-09-29 DIAGNOSIS — S59291A Other physeal fracture of lower end of radius, right arm, initial encounter for closed fracture: Secondary | ICD-10-CM | POA: Diagnosis not present

## 2017-09-29 MED ORDER — ONDANSETRON 4 MG PO TBDP
4.0000 mg | ORAL_TABLET | Freq: Once | ORAL | Status: AC
Start: 2017-09-29 — End: 2017-09-29
  Administered 2017-09-29: 4 mg via ORAL
  Filled 2017-09-29: qty 1

## 2017-09-29 MED ORDER — HYDROCODONE-ACETAMINOPHEN 5-325 MG PO TABS: 1.0000 | ORAL_TABLET | Freq: Four times a day (QID) | ORAL | 0 refills | Status: DC | PRN

## 2017-09-29 MED ORDER — FENTANYL CITRATE (PF) 100 MCG/2ML IJ SOLN
50.0000 ug | Freq: Once | INTRAMUSCULAR | Status: AC
  Administered 2017-09-29: 50 ug via INTRAVENOUS
  Filled 2017-09-29: qty 2

## 2017-09-29 MED ORDER — FENTANYL CITRATE (PF) 100 MCG/2ML IJ SOLN
50.0000 ug | Freq: Once | INTRAMUSCULAR | Status: AC
  Administered 2017-09-29: 50 ug via INTRAMUSCULAR
  Filled 2017-09-29: qty 2

## 2017-09-29 NOTE — ED Triage Notes (Signed)
Pt reports a mechanical fall earlier today causing her to land on her R wrist. Deformity noted. Pt applied ice pack and took acetaminophen PTA. A&Ox4. No LOC, no other injuries endorsed.

## 2017-09-29 NOTE — ED Provider Notes (Signed)
[facility]-EMERGENCY DEPT Provider Note   CSN: [id number] Arrival date & time: 09/29/17  1706     History   Chief Complaint Chief Complaint  Patient presents with  . Wrist Injury    R  . Fall    HPI Pamela [name] is a 71 y.o. female.  Pamela [name] is a 71 y.o. Female who presents to the ED after a trip and fall prior to arrival with a right wrist injury.  Patient reports she was on a walk with her neighbor when stumbled due to the irregularity of the walk and fell on her outstretched right hand.  She denies other injury, head injury or loss of consciousness.  She complains of pain only to her right wrist.  Her husband helped her splinted with an ice pack and she was brought to the emergency department.  She denies any pain to her right elbow or shoulder.  No neck or back pain.  She took a gram of Tylenol prior to arrival.  She denies numbness, tingling or weakness. She denies any prodromal symptoms prior to the fall such as chest pain, dizziness or SOB. She reports she just tripped.  Last Tdap was less than 3 years ago.    The history is provided by the patient, medical records and the spouse. No language interpreter was used.  Wrist Injury   Pertinent negatives include no fever.  Fall  Pertinent negatives include no chest pain, no abdominal pain and no shortness of breath.    Past Medical History:  Diagnosis Date  . Colon polyps   . Complication of anesthesia 1980's   laparoscopic procedure invitro, bradycardia and heart stopped at [facility]  . Diverticulitis   . Diverticulosis   . Fibrocystic breast disease   . Hyperlipidemia   . Hypertension    elevated bp in past, no current meds  . Hypothyroidism   . Osteopenia   . Tubal ectopic pregnancy    x 3  . Vaginal atrophy   . Vitamin D deficiency     Patient Active Problem List   Diagnosis Date Noted  . Right arm pain 09/03/2015  . Family history of cardiac disorder 03/27/2014  . Hypertension   .  Diverticulitis of sigmoid colon s/p robotic colectomy 09/04/2013 08/14/2013  . Hypothyroidism 06/26/2011  . Osteopenia 06/26/2011  . Fibrocystic breast disease 06/26/2011  . Hyperlipidemia 06/26/2011  . Insomnia 06/26/2011    Past Surgical History:  Procedure Laterality Date  . BREAST CYST ASPIRATION  2003  . ECTOPIC PREGNANCY SURGERY  1980s   x3  . FERTILITY SURGERY     x4 IVF  . PARTIAL COLECTOMY    . tubal repair  1980's  . WRIST FRACTURE SURGERY Left 1987    OB History    No data available       Home Medications    Prior to Admission medications   Medication Sig Start Date End Date Taking? Authorizing Provider  acetaminophen (TYLENOL) 500 MG tablet Take 1,000 mg by mouth every 6 (six) hours as needed for pain.   Yes [provider]  cholecalciferol (VITAMIN D) 1000 UNITS tablet Take 1,000 Units by mouth daily.   Yes [provider]  levothyroxine (SYNTHROID, LEVOTHROID) 88 MCG tablet Take 1 tablet (88 mcg total) by mouth daily. 07/08/17  Yes Baxley, Luanna Cole, MD  psyllium (METAMUCIL) 0.52 g capsule Take 0.52 g by mouth at bedtime.    Yes [provider]  simvastatin (ZOCOR) 10 MG tablet TAKE  1 TABLET(10 MG) BY MOUTH DAILY AT 6 PM 08/16/17  Yes Baxley, Luanna ColeMary J, MD  HYDROcodone-acetaminophen (NORCO/VICODIN) 5-325 MG tablet Take 1-2 tablets by mouth every 6 (six) hours as needed for moderate pain or severe pain. 09/29/17   Everlene Farrieransie, Kaylean Tupou, PA-C  zolpidem (AMBIEN) 5 MG tablet Take 1 tablet (5 mg total) by mouth at bedtime as needed. Patient taking differently: Take 5 mg by mouth at bedtime as needed for sleep.  03/25/17   Margaree MackintoshBaxley, Mary J, MD    Family History Family History  Problem Relation Age of Onset  . Colon cancer Paternal Uncle   . Cancer Paternal Uncle        Colon  . Bladder Cancer Paternal Uncle   . Cancer Paternal Retail bankerUncle        Prostate/Bladder  . Diabetes Father   . Heart disease Father   . Stroke Father   . Hypertension Mother   .  Cancer Mother        Lung  . Prostate cancer Paternal Uncle   . Skin cancer Paternal Uncle   . Diabetes Other        father's side juvenile and adult onset  . Cancer Paternal Grandfather        Skin/Stomach    Social History Social History  Substance Use Topics  . Smoking status: Never Smoker  . Smokeless tobacco: Never Used  . Alcohol use 0.0 oz/week     Comment: 1 per day     Allergies   Succinylcholine; Boniva [ibandronate sodium]; Morphine and related; and Penicillins   Review of Systems Review of Systems  Constitutional: Negative for fever.  HENT: Negative for nosebleeds.   Eyes: Negative for visual disturbance.  Respiratory: Negative for cough and shortness of breath.   Cardiovascular: Negative for chest pain.  Gastrointestinal: Negative for abdominal pain, diarrhea, nausea and vomiting.  Genitourinary: Negative for dysuria.  Musculoskeletal: Positive for arthralgias and joint swelling. Negative for back pain and neck pain.  Skin: Positive for wound. Negative for rash.  Neurological: Negative for dizziness, syncope, weakness, light-headedness and numbness.     Physical Exam Updated Vital Signs BP (!) 182/100 (BP Location: Left Arm)   Pulse 65   Temp 97.8 F (36.6 C)   Resp 18   Ht 5\' 2"  (1.575 m)   Wt 59 kg (130 lb)   SpO2 99%   BMI 23.78 kg/m   Physical Exam  Constitutional: She appears well-developed and well-nourished. No distress.  HENT:  Head: Normocephalic and atraumatic.  Eyes: Right eye exhibits no discharge. Left eye exhibits no discharge.  Neck: Neck supple.  Cardiovascular: Normal rate, regular rhythm, normal heart sounds and intact distal pulses.   Bilateral radial pulses are intact.  Pulmonary/Chest: Effort normal and breath sounds normal. No respiratory distress.  Abdominal: Soft. There is no tenderness.  Musculoskeletal: She exhibits edema, tenderness and deformity.  Patient has tenderness, edema mild deformity noted to her right  wrist with a small abrasion noted to the medial aspect.  She has good sensation to her fingertips bilaterally.  Lymphadenopathy:    She has no cervical adenopathy.  Neurological: She is alert. No sensory deficit. Coordination normal.  Good sensation to her bilateral fingertips.  Skin: Skin is warm and dry. Capillary refill takes less than 2 seconds. No rash noted. She is not diaphoretic. No erythema. No pallor.  Psychiatric: She has a normal mood and affect. Her behavior is normal.  Nursing note and vitals reviewed.    ED  Treatments / Results  Labs (all labs ordered are listed, but only abnormal results are displayed) Labs Reviewed - No data to display  EKG  EKG Interpretation None       Radiology Dg Wrist Complete Right  Result Date: 09/29/2017 CLINICAL DATA:  Patient fell.  Wrist pain. EXAM: RIGHT WRIST - COMPLETE 3+ VIEW COMPARISON:  None. FINDINGS: Comminuted distal radius fracture noted with intra-articular extension and mild apex posterior angulation. There is associated ulnar styloid fracture. Degenerative changes are evident in the first carpometacarpal joint. Bones are diffusely demineralized. IMPRESSION: 1. Comminuted distal radius fracture with intra-articular extension. 2. Ulnar styloid fracture. 3. First CMC joint osteoarthritis. Electronically Signed   By: Kennith Center M.D.   On: 09/29/2017 18:06    Procedures Procedures (including critical care time)  SPLINT APPLICATION Date/Time: 7:35 PM Authorized by: Lawana Chambers Consent: Verbal consent obtained. Risks and benefits: risks, benefits and alternatives were discussed Consent given by: patient Splint applied by: orthopedic technician Location details: right wrist Splint type: sugar tong Post-procedure: The splinted body part was neurovascularly unchanged following the procedure. Patient tolerance: Patient tolerated the procedure well with no immediate complications.     Medications Ordered in  ED Medications  fentaNYL (SUBLIMAZE) injection 50 mcg (not administered)  fentaNYL (SUBLIMAZE) injection 50 mcg (50 mcg Intramuscular Given 09/29/17 1813)  ondansetron (ZOFRAN-ODT) disintegrating tablet 4 mg (4 mg Oral Given 09/29/17 1812)     Initial Impression / Assessment and Plan / ED Course  I have reviewed the triage vital signs and the nursing notes.  Pertinent labs & imaging results that were available during my care of the patient were reviewed by me and considered in my medical decision making (see chart for details).    This  is a 71 y.o. Female who presents to the ED after a trip and fall prior to arrival with a right wrist injury.  Patient reports she was on a walk with her neighbor when stumbled due to the irregularity of the walk and fell on her outstretched right hand.  She denies other injury, head injury or loss of consciousness.  She complains of pain only to her right wrist.  Her husband helped her splinted with an ice pack and she was brought to the emergency department.  She denies any pain to her right elbow or shoulder.  No neck or back pain.  She took a gram of Tylenol prior to arrival.  She denies numbness, tingling or weakness. Last Tdap was 3 years ago.  On exam the patient is afebrile and nontoxic-appearing.  She has mild deformity, tenderness and edema noted to her right wrist.  She is neurovascular intact.  Good capillary refill and radial pulses.  X-ray shows a comminuted distal radial fracture with intra-articular extension and ulnar styloid fracture.  There is mild apex posterior angulation of the distal radial fracture. I called and consulted with hand surgeon Dr. Janee Morn.  He would like me to place the patient in a sugar tong splint and have her follow-up in his office later this week.  Patient agrees with this plan.  At reevaluation after splint application patient reports she is feeling comfortable.  Will discharge with a small amount of Norco.  Patient was  looked up on the [place] controlled substance database.  No recent prescriptions for narcotic pain medicines or sedatives.  I discussed precautions when using narcotic pain medicine.  Splint care and precautions discussed.  Return precautions discussed. I advised the patient to follow-up with  their primary care provider this week. I advised the patient to return to the emergency department with new or worsening symptoms or new concerns. The patient verbalized understanding and agreement with plan.      Final Clinical Impressions(s) / ED Diagnoses   Final diagnoses:  Other closed intra-articular fracture of distal end of right radius, initial encounter  Closed displaced fracture of styloid process of right ulna, initial encounter    New Prescriptions New Prescriptions   HYDROCODONE-ACETAMINOPHEN (NORCO/VICODIN) 5-325 MG TABLET    Take 1-2 tablets by mouth every 6 (six) hours as needed for moderate pain or severe pain.     Everlene Farrier, PA-C 09/29/17 2006    Rolan Bucco, MD 09/29/17 [phone]

## 2017-10-05 DIAGNOSIS — S52571A Other intraarticular fracture of lower end of right radius, initial encounter for closed fracture: Secondary | ICD-10-CM | POA: Diagnosis not present

## 2017-10-07 DIAGNOSIS — S52571A Other intraarticular fracture of lower end of right radius, initial encounter for closed fracture: Secondary | ICD-10-CM | POA: Diagnosis not present

## 2017-10-07 DIAGNOSIS — G8918 Other acute postprocedural pain: Secondary | ICD-10-CM | POA: Diagnosis not present

## 2017-10-19 DIAGNOSIS — S52591D Other fractures of lower end of right radius, subsequent encounter for closed fracture with routine healing: Secondary | ICD-10-CM | POA: Diagnosis not present

## 2017-10-19 DIAGNOSIS — S52571D Other intraarticular fracture of lower end of right radius, subsequent encounter for closed fracture with routine healing: Secondary | ICD-10-CM | POA: Diagnosis not present

## 2017-10-19 DIAGNOSIS — M25531 Pain in right wrist: Secondary | ICD-10-CM | POA: Diagnosis not present

## 2017-10-25 DIAGNOSIS — S52591D Other fractures of lower end of right radius, subsequent encounter for closed fracture with routine healing: Secondary | ICD-10-CM | POA: Diagnosis not present

## 2017-10-25 DIAGNOSIS — M25531 Pain in right wrist: Secondary | ICD-10-CM | POA: Diagnosis not present

## 2017-10-27 DIAGNOSIS — M25531 Pain in right wrist: Secondary | ICD-10-CM | POA: Diagnosis not present

## 2017-10-27 DIAGNOSIS — S52591D Other fractures of lower end of right radius, subsequent encounter for closed fracture with routine healing: Secondary | ICD-10-CM | POA: Diagnosis not present

## 2017-11-01 DIAGNOSIS — M25531 Pain in right wrist: Secondary | ICD-10-CM | POA: Diagnosis not present

## 2017-11-01 DIAGNOSIS — S52591D Other fractures of lower end of right radius, subsequent encounter for closed fracture with routine healing: Secondary | ICD-10-CM | POA: Diagnosis not present

## 2017-11-03 DIAGNOSIS — S52591D Other fractures of lower end of right radius, subsequent encounter for closed fracture with routine healing: Secondary | ICD-10-CM | POA: Diagnosis not present

## 2017-11-03 DIAGNOSIS — M25531 Pain in right wrist: Secondary | ICD-10-CM | POA: Diagnosis not present

## 2017-11-08 DIAGNOSIS — M25531 Pain in right wrist: Secondary | ICD-10-CM | POA: Diagnosis not present

## 2017-11-08 DIAGNOSIS — S52591D Other fractures of lower end of right radius, subsequent encounter for closed fracture with routine healing: Secondary | ICD-10-CM | POA: Diagnosis not present

## 2017-11-10 DIAGNOSIS — S52591D Other fractures of lower end of right radius, subsequent encounter for closed fracture with routine healing: Secondary | ICD-10-CM | POA: Diagnosis not present

## 2017-11-10 DIAGNOSIS — M25531 Pain in right wrist: Secondary | ICD-10-CM | POA: Diagnosis not present

## 2017-11-14 DIAGNOSIS — S52591D Other fractures of lower end of right radius, subsequent encounter for closed fracture with routine healing: Secondary | ICD-10-CM | POA: Diagnosis not present

## 2017-11-14 DIAGNOSIS — M25531 Pain in right wrist: Secondary | ICD-10-CM | POA: Diagnosis not present

## 2017-11-16 DIAGNOSIS — S52591D Other fractures of lower end of right radius, subsequent encounter for closed fracture with routine healing: Secondary | ICD-10-CM | POA: Diagnosis not present

## 2017-11-16 DIAGNOSIS — S52571D Other intraarticular fracture of lower end of right radius, subsequent encounter for closed fracture with routine healing: Secondary | ICD-10-CM | POA: Diagnosis not present

## 2017-11-16 DIAGNOSIS — M25531 Pain in right wrist: Secondary | ICD-10-CM | POA: Diagnosis not present

## 2017-11-21 DIAGNOSIS — S52591D Other fractures of lower end of right radius, subsequent encounter for closed fracture with routine healing: Secondary | ICD-10-CM | POA: Diagnosis not present

## 2017-11-21 DIAGNOSIS — M25531 Pain in right wrist: Secondary | ICD-10-CM | POA: Diagnosis not present

## 2017-12-02 DIAGNOSIS — M25531 Pain in right wrist: Secondary | ICD-10-CM | POA: Diagnosis not present

## 2017-12-02 DIAGNOSIS — S52591D Other fractures of lower end of right radius, subsequent encounter for closed fracture with routine healing: Secondary | ICD-10-CM | POA: Diagnosis not present

## 2017-12-05 DIAGNOSIS — M25531 Pain in right wrist: Secondary | ICD-10-CM | POA: Diagnosis not present

## 2017-12-05 DIAGNOSIS — S52591D Other fractures of lower end of right radius, subsequent encounter for closed fracture with routine healing: Secondary | ICD-10-CM | POA: Diagnosis not present

## 2017-12-08 DIAGNOSIS — M25531 Pain in right wrist: Secondary | ICD-10-CM | POA: Diagnosis not present

## 2017-12-08 DIAGNOSIS — S52591D Other fractures of lower end of right radius, subsequent encounter for closed fracture with routine healing: Secondary | ICD-10-CM | POA: Diagnosis not present

## 2017-12-12 ENCOUNTER — Other Ambulatory Visit: Payer: Self-pay | Admitting: Internal Medicine

## 2017-12-12 DIAGNOSIS — E039 Hypothyroidism, unspecified: Secondary | ICD-10-CM

## 2017-12-13 DIAGNOSIS — S52591D Other fractures of lower end of right radius, subsequent encounter for closed fracture with routine healing: Secondary | ICD-10-CM | POA: Diagnosis not present

## 2017-12-13 DIAGNOSIS — M25531 Pain in right wrist: Secondary | ICD-10-CM | POA: Diagnosis not present

## 2017-12-16 DIAGNOSIS — M79604 Pain in right leg: Secondary | ICD-10-CM | POA: Diagnosis not present

## 2017-12-16 DIAGNOSIS — M25531 Pain in right wrist: Secondary | ICD-10-CM | POA: Diagnosis not present

## 2017-12-16 DIAGNOSIS — S52591D Other fractures of lower end of right radius, subsequent encounter for closed fracture with routine healing: Secondary | ICD-10-CM | POA: Diagnosis not present

## 2017-12-20 DIAGNOSIS — M25531 Pain in right wrist: Secondary | ICD-10-CM | POA: Diagnosis not present

## 2017-12-20 DIAGNOSIS — S52591D Other fractures of lower end of right radius, subsequent encounter for closed fracture with routine healing: Secondary | ICD-10-CM | POA: Diagnosis not present

## 2017-12-21 DIAGNOSIS — S52571D Other intraarticular fracture of lower end of right radius, subsequent encounter for closed fracture with routine healing: Secondary | ICD-10-CM | POA: Diagnosis not present

## 2017-12-22 DIAGNOSIS — S52591D Other fractures of lower end of right radius, subsequent encounter for closed fracture with routine healing: Secondary | ICD-10-CM | POA: Diagnosis not present

## 2017-12-22 DIAGNOSIS — M25531 Pain in right wrist: Secondary | ICD-10-CM | POA: Diagnosis not present

## 2017-12-23 DIAGNOSIS — M7631 Iliotibial band syndrome, right leg: Secondary | ICD-10-CM | POA: Diagnosis not present

## 2017-12-23 DIAGNOSIS — M25551 Pain in right hip: Secondary | ICD-10-CM | POA: Diagnosis not present

## 2017-12-27 DIAGNOSIS — S52591D Other fractures of lower end of right radius, subsequent encounter for closed fracture with routine healing: Secondary | ICD-10-CM | POA: Diagnosis not present

## 2017-12-27 DIAGNOSIS — M25531 Pain in right wrist: Secondary | ICD-10-CM | POA: Diagnosis not present

## 2017-12-28 DIAGNOSIS — M7631 Iliotibial band syndrome, right leg: Secondary | ICD-10-CM | POA: Diagnosis not present

## 2017-12-28 DIAGNOSIS — M25551 Pain in right hip: Secondary | ICD-10-CM | POA: Diagnosis not present

## 2017-12-29 DIAGNOSIS — M25531 Pain in right wrist: Secondary | ICD-10-CM | POA: Diagnosis not present

## 2017-12-29 DIAGNOSIS — S52591D Other fractures of lower end of right radius, subsequent encounter for closed fracture with routine healing: Secondary | ICD-10-CM | POA: Diagnosis not present

## 2018-01-03 DIAGNOSIS — M25531 Pain in right wrist: Secondary | ICD-10-CM | POA: Diagnosis not present

## 2018-01-03 DIAGNOSIS — S52591D Other fractures of lower end of right radius, subsequent encounter for closed fracture with routine healing: Secondary | ICD-10-CM | POA: Diagnosis not present

## 2018-01-04 DIAGNOSIS — M25551 Pain in right hip: Secondary | ICD-10-CM | POA: Diagnosis not present

## 2018-01-04 DIAGNOSIS — M7631 Iliotibial band syndrome, right leg: Secondary | ICD-10-CM | POA: Diagnosis not present

## 2018-01-05 DIAGNOSIS — S52591D Other fractures of lower end of right radius, subsequent encounter for closed fracture with routine healing: Secondary | ICD-10-CM | POA: Diagnosis not present

## 2018-01-05 DIAGNOSIS — M25531 Pain in right wrist: Secondary | ICD-10-CM | POA: Diagnosis not present

## 2018-01-06 ENCOUNTER — Other Ambulatory Visit: Payer: Medicare Other | Admitting: Internal Medicine

## 2018-01-06 DIAGNOSIS — E039 Hypothyroidism, unspecified: Secondary | ICD-10-CM | POA: Diagnosis not present

## 2018-01-06 LAB — TSH: TSH: 2.06 m[IU]/L (ref 0.40–4.50)

## 2018-01-09 ENCOUNTER — Encounter: Payer: Self-pay | Admitting: Internal Medicine

## 2018-01-09 ENCOUNTER — Ambulatory Visit: Payer: Medicare Other | Admitting: Internal Medicine

## 2018-01-09 VITALS — BP 130/90 | HR 60 | Ht 61.5 in | Wt 142.0 lb

## 2018-01-09 DIAGNOSIS — E039 Hypothyroidism, unspecified: Secondary | ICD-10-CM

## 2018-01-09 DIAGNOSIS — S62101D Fracture of unspecified carpal bone, right wrist, subsequent encounter for fracture with routine healing: Secondary | ICD-10-CM

## 2018-01-09 DIAGNOSIS — E785 Hyperlipidemia, unspecified: Secondary | ICD-10-CM

## 2018-01-09 DIAGNOSIS — E2839 Other primary ovarian failure: Secondary | ICD-10-CM | POA: Diagnosis not present

## 2018-01-09 NOTE — Progress Notes (Signed)
   Subjective:    Patient ID: Pamela [name], female    DOB: [birth year], 72 y.o.   MRN: [id number]  HPI 72 year old Female for 6 month follow up.  History of hypothyroidism.  She suffered a fall in November while on a walk with her neighbor and fell on her outstretched right hand sustaining a closed intra-articular fracture of the distal end of the right radius.  Also closed displaced fracture of styloid process right ulna.  It has slowly healed.  Will order bone density study since she has suffered a fracture and is postmenopausal.  With regard to hypothyroidism TSH is stable at 2.06  She takes Zocor 10 mg for hyperlipidemia.  History of elevated blood pressure in my office.  It is stable today at 130/90.  She will continue to monitor it at home.  History of fibrocystic breast disease and will need mammogram in February 2019.     Review of Systems see above-no further issues with recurrent diverticulitis status post partial colectomy in 2014     Objective:   Physical Exam No thyromegaly.  Chest clear.  Cardiac exam regular rate and rhythm normal S1 and 2.  Extremities without edema.       Assessment & Plan:  Hypothyroidism-stable on thyroid replacement  Recent radius and ulna fracture right wrist secondary to a fall-order bone density study  Elevated blood pressure in office-likely office hypertension.  Patient continue to monitor at home  Hyperlipidemia-takes low-dose Crestor will follow-up at time of physical exam  Plan: Continue current medications and follow-up in August for physical examination and fasting lab work.

## 2018-01-10 DIAGNOSIS — S52591D Other fractures of lower end of right radius, subsequent encounter for closed fracture with routine healing: Secondary | ICD-10-CM | POA: Diagnosis not present

## 2018-01-10 DIAGNOSIS — M25531 Pain in right wrist: Secondary | ICD-10-CM | POA: Diagnosis not present

## 2018-01-11 DIAGNOSIS — M25551 Pain in right hip: Secondary | ICD-10-CM | POA: Diagnosis not present

## 2018-01-11 DIAGNOSIS — M7631 Iliotibial band syndrome, right leg: Secondary | ICD-10-CM | POA: Diagnosis not present

## 2018-01-12 DIAGNOSIS — S52591D Other fractures of lower end of right radius, subsequent encounter for closed fracture with routine healing: Secondary | ICD-10-CM | POA: Diagnosis not present

## 2018-01-12 DIAGNOSIS — M25531 Pain in right wrist: Secondary | ICD-10-CM | POA: Diagnosis not present

## 2018-01-17 DIAGNOSIS — M7631 Iliotibial band syndrome, right leg: Secondary | ICD-10-CM | POA: Diagnosis not present

## 2018-01-17 DIAGNOSIS — M25551 Pain in right hip: Secondary | ICD-10-CM | POA: Diagnosis not present

## 2018-01-20 ENCOUNTER — Other Ambulatory Visit: Payer: Self-pay | Admitting: Internal Medicine

## 2018-01-23 DIAGNOSIS — M7631 Iliotibial band syndrome, right leg: Secondary | ICD-10-CM | POA: Diagnosis not present

## 2018-01-23 DIAGNOSIS — M25551 Pain in right hip: Secondary | ICD-10-CM | POA: Diagnosis not present

## 2018-01-25 DIAGNOSIS — M79604 Pain in right leg: Secondary | ICD-10-CM | POA: Diagnosis not present

## 2018-01-25 NOTE — Patient Instructions (Signed)
It was a pleasure to see you today.  Continue same medications and follow-up in August.  Please have bone density study due to recent wrist fracture.

## 2018-01-26 DIAGNOSIS — M25551 Pain in right hip: Secondary | ICD-10-CM | POA: Diagnosis not present

## 2018-01-26 DIAGNOSIS — M7631 Iliotibial band syndrome, right leg: Secondary | ICD-10-CM | POA: Diagnosis not present

## 2018-01-31 DIAGNOSIS — S52591D Other fractures of lower end of right radius, subsequent encounter for closed fracture with routine healing: Secondary | ICD-10-CM | POA: Diagnosis not present

## 2018-01-31 DIAGNOSIS — S52571D Other intraarticular fracture of lower end of right radius, subsequent encounter for closed fracture with routine healing: Secondary | ICD-10-CM | POA: Diagnosis not present

## 2018-01-31 DIAGNOSIS — M25531 Pain in right wrist: Secondary | ICD-10-CM | POA: Diagnosis not present

## 2018-02-01 DIAGNOSIS — M7631 Iliotibial band syndrome, right leg: Secondary | ICD-10-CM | POA: Diagnosis not present

## 2018-02-01 DIAGNOSIS — M25551 Pain in right hip: Secondary | ICD-10-CM | POA: Diagnosis not present

## 2018-02-03 DIAGNOSIS — M7631 Iliotibial band syndrome, right leg: Secondary | ICD-10-CM | POA: Diagnosis not present

## 2018-02-03 DIAGNOSIS — M25551 Pain in right hip: Secondary | ICD-10-CM | POA: Diagnosis not present

## 2018-02-28 ENCOUNTER — Encounter: Payer: Self-pay | Admitting: Internal Medicine

## 2018-02-28 ENCOUNTER — Other Ambulatory Visit: Payer: Self-pay | Admitting: Internal Medicine

## 2018-02-28 ENCOUNTER — Telehealth: Payer: Self-pay

## 2018-02-28 ENCOUNTER — Telehealth: Payer: Self-pay | Admitting: Internal Medicine

## 2018-02-28 DIAGNOSIS — Z139 Encounter for screening, unspecified: Secondary | ICD-10-CM

## 2018-02-28 NOTE — Telephone Encounter (Signed)
Tobi Bastosnna from [facility] has called regarding a Bone Density for this patient.  Today has been approximately her 4th phone call to our office.  She has been told that we put the order in, fax it to either location that the patient wants to go to and the patient is given the hard copy of the order and the expectation is that they will either call to make the appointment or they will follow up on the fax that we sent over.  Secondly, the receiving end of that fax ([facility] or [facility]) should also be calling the patient once they receive the fax.  This has been told to Tobi Bastosnna each time she calls.  Tobi Bastosnna has been asking us to call the patient and get this scheduled for her.  We have told this to Tobi Bastosnna each time she calls.  Patient needs to call and get this scheduled.    Today when Tobi Bastosnna called, she was talking to Araceli and I told her that I would like to speak with her to go over this process once again.  I again told her our process once again.  She advised that she didn't recall having this conversation in the past.  However, she would be happy to call the patient.  She had told Araceli on the last 2 calls that she was going to call the patient to get her scheduled.  I was firm on the fact that we do not have the time or staff to do this over and over again.  She took my firmness as being mean and stressed.  I was trying to make her understand that we have done our part by putting the order in and the patient now needs to take some accountability for their health and call and make the appointment.  We did fax it over to [facility] before handing her the original order.  Spoke with Dr. Lenord FellersBaxley regarding this conversation as this is getting to be too much with [facility] calling 4-5 times every few weeks.  She tried to call the patient and the patient did NOT answer the phone.  She [facility]OTHER [facility].  So, again, we will hope that [facility] will reach out to the patient and attempt to  schedule this Bone Density.  However, they too may be getting no answer from the patient when they call as well.

## 2018-02-28 NOTE — Telephone Encounter (Signed)
Patient contacted by phone. No answer. Left message for pt to please call and schedule her bone density study. Order was placed in February.

## 2018-03-30 ENCOUNTER — Ambulatory Visit
Admission: RE | Admit: 2018-03-30 | Discharge: 2018-03-30 | Disposition: A | Discharge status: Home / Self Care | Payer: Medicare Other | Source: Ambulatory Visit | Attending: Internal Medicine | Admitting: Internal Medicine

## 2018-03-30 DIAGNOSIS — Z139 Encounter for screening, unspecified: Secondary | ICD-10-CM

## 2018-03-30 DIAGNOSIS — M8589 Other specified disorders of bone density and structure, multiple sites: Secondary | ICD-10-CM | POA: Diagnosis not present

## 2018-03-30 DIAGNOSIS — E2839 Other primary ovarian failure: Secondary | ICD-10-CM

## 2018-03-30 DIAGNOSIS — Z780 Asymptomatic menopausal state: Secondary | ICD-10-CM | POA: Diagnosis not present

## 2018-03-30 DIAGNOSIS — Z1231 Encounter for screening mammogram for malignant neoplasm of breast: Secondary | ICD-10-CM | POA: Diagnosis not present

## 2018-03-30 DIAGNOSIS — S62101D Fracture of unspecified carpal bone, right wrist, subsequent encounter for fracture with routine healing: Secondary | ICD-10-CM

## 2018-04-17 ENCOUNTER — Encounter: Payer: Self-pay | Admitting: Internal Medicine

## 2018-04-17 ENCOUNTER — Ambulatory Visit: Payer: Medicare Other | Admitting: Internal Medicine

## 2018-04-17 VITALS — BP 130/80 | HR 68 | Ht 61.5 in | Wt 138.0 lb

## 2018-04-17 DIAGNOSIS — Z8639 Personal history of other endocrine, nutritional and metabolic disease: Secondary | ICD-10-CM

## 2018-04-17 DIAGNOSIS — Z8781 Personal history of (healed) traumatic fracture: Secondary | ICD-10-CM | POA: Diagnosis not present

## 2018-04-17 DIAGNOSIS — M8580 Other specified disorders of bone density and structure, unspecified site: Secondary | ICD-10-CM | POA: Diagnosis not present

## 2018-04-26 NOTE — Progress Notes (Signed)
   Subjective:    Patient ID: Pamela [name], female    DOB: [birth year], 72 y.o.   MRN: [id number]  HPI She is here today to discuss bone density results.  Bone density study in 2012 showed a T score in the LS spine of -1.7 and in the femoral neck of -2.1.  In August 2018 she indicated she did not want to be on bisphosphonate medication.  She was taking some vitamin D but not regularly.  In the interim, in November 2018 she tripped and fell and suffered a comminuted distal radius fracture with intra-articular extension and mild apex posterior angulation.  There was an associated ulnar styloid fracture.  Bones were noted to be diffusely demineralized.  I was finally able to get her to consider another bone density study which was done on May 2.  She has a T score in the right femoral neck of -1.9 and in the AP spine of -1.1.  In 2012 T score was -1.7 in the lumbar spine and -2.1 in the left femoral neck which is interesting  Vitamin D level in August 2018 was 27.  Discussion today regarding fracture, vitamin D deficiency, and consideration of bisphosphonate therapy    Review of Systems     Objective:   Physical Exam She was not examined but I spent 20 minutes speaking with her about the results of these 2 bone density studies and what should be done.  Clearly she should take vitamin D3 daily.  We can follow-up with vitamin D level at time of physical exam.  We discussed oral bisphosphonates.  It is interesting that her bone density that he has actually improved from 2012.  She does get some exercise with gardening.  She defers bisphosphonate therapy at this point in time.  We will follow-up with her at time of physical exam in August.  She will have a vitamin D level at that time.       Assessment & Plan:

## 2018-04-26 NOTE — Patient Instructions (Signed)
Make sure you are taking vitamin D3 daily and follow-up in August for physical exam.  Vitamin D level will be done at that time

## 2018-06-19 ENCOUNTER — Other Ambulatory Visit: Payer: Self-pay | Admitting: Internal Medicine

## 2018-06-28 ENCOUNTER — Other Ambulatory Visit: Payer: Self-pay | Admitting: Internal Medicine

## 2018-06-28 DIAGNOSIS — E039 Hypothyroidism, unspecified: Secondary | ICD-10-CM

## 2018-06-28 DIAGNOSIS — Z8781 Personal history of (healed) traumatic fracture: Secondary | ICD-10-CM

## 2018-06-28 DIAGNOSIS — I10 Essential (primary) hypertension: Secondary | ICD-10-CM

## 2018-06-28 DIAGNOSIS — G4700 Insomnia, unspecified: Secondary | ICD-10-CM

## 2018-06-28 DIAGNOSIS — E559 Vitamin D deficiency, unspecified: Secondary | ICD-10-CM

## 2018-06-28 DIAGNOSIS — Z9049 Acquired absence of other specified parts of digestive tract: Secondary | ICD-10-CM

## 2018-06-28 DIAGNOSIS — E7849 Other hyperlipidemia: Secondary | ICD-10-CM

## 2018-06-28 DIAGNOSIS — Z0000 Encounter for general adult medical examination without abnormal findings: Secondary | ICD-10-CM

## 2018-06-28 DIAGNOSIS — M8580 Other specified disorders of bone density and structure, unspecified site: Secondary | ICD-10-CM

## 2018-07-03 ENCOUNTER — Other Ambulatory Visit: Payer: Medicare Other | Admitting: Internal Medicine

## 2018-07-03 DIAGNOSIS — I10 Essential (primary) hypertension: Secondary | ICD-10-CM

## 2018-07-03 DIAGNOSIS — Z9049 Acquired absence of other specified parts of digestive tract: Secondary | ICD-10-CM

## 2018-07-03 DIAGNOSIS — E039 Hypothyroidism, unspecified: Secondary | ICD-10-CM | POA: Diagnosis not present

## 2018-07-03 DIAGNOSIS — E7849 Other hyperlipidemia: Secondary | ICD-10-CM | POA: Diagnosis not present

## 2018-07-03 DIAGNOSIS — E559 Vitamin D deficiency, unspecified: Secondary | ICD-10-CM

## 2018-07-03 DIAGNOSIS — Z8781 Personal history of (healed) traumatic fracture: Secondary | ICD-10-CM

## 2018-07-03 DIAGNOSIS — Z0000 Encounter for general adult medical examination without abnormal findings: Secondary | ICD-10-CM | POA: Diagnosis not present

## 2018-07-03 DIAGNOSIS — M8580 Other specified disorders of bone density and structure, unspecified site: Secondary | ICD-10-CM

## 2018-07-03 DIAGNOSIS — G4700 Insomnia, unspecified: Secondary | ICD-10-CM

## 2018-07-04 LAB — CBC WITH DIFFERENTIAL/PLATELET
BASOS PCT: 0.7 %
Basophils Absolute: 29 cells/uL (ref 0–200)
EOS ABS: 101 {cells}/uL (ref 15–500)
Eosinophils Relative: 2.4 %
HCT: 39.1 % (ref 35.0–45.0)
Hemoglobin: 13.3 g/dL (ref 11.7–15.5)
Lymphs Abs: 1537 cells/uL (ref 850–3900)
MCH: 30.2 pg (ref 27.0–33.0)
MCHC: 34 g/dL (ref 32.0–36.0)
MCV: 88.7 fL (ref 80.0–100.0)
MONOS PCT: 7.9 %
MPV: 11.7 fL (ref 7.5–12.5)
Neutro Abs: 2201 cells/uL (ref 1500–7800)
Neutrophils Relative %: 52.4 %
PLATELETS: 238 10*3/uL (ref 140–400)
RBC: 4.41 10*6/uL (ref 3.80–5.10)
RDW: 12.9 % (ref 11.0–15.0)
Total Lymphocyte: 36.6 %
WBC: 4.2 10*3/uL (ref 3.8–10.8)
WBCMIX: 332 {cells}/uL (ref 200–950)

## 2018-07-04 LAB — COMPLETE METABOLIC PANEL WITH GFR
AG Ratio: 1.9 (calc) (ref 1.0–2.5)
ALT: 14 U/L (ref 6–29)
AST: 15 U/L (ref 10–35)
Albumin: 4.5 g/dL (ref 3.6–5.1)
Alkaline phosphatase (APISO): 52 U/L (ref 33–130)
BUN: 14 mg/dL (ref 7–25)
CALCIUM: 9.6 mg/dL (ref 8.6–10.4)
CO2: 29 mmol/L (ref 20–32)
CREATININE: 0.79 mg/dL (ref 0.60–0.93)
Chloride: 104 mmol/L (ref 98–110)
GFR, EST NON AFRICAN AMERICAN: 76 mL/min/{1.73_m2} (ref >=60)
GFR, Est African American: 88 mL/min/{1.73_m2} (ref >=60)
Globulin: 2.4 g/dL (calc) (ref 1.9–3.7)
Glucose, Bld: 99 mg/dL (ref 65–99)
POTASSIUM: 4.5 mmol/L (ref 3.5–5.3)
Sodium: 140 mmol/L (ref 135–146)
Total Bilirubin: 0.5 mg/dL (ref 0.2–1.2)
Total Protein: 6.9 g/dL (ref 6.1–8.1)

## 2018-07-04 LAB — TSH: TSH: 1.22 mIU/L (ref 0.40–4.50)

## 2018-07-04 LAB — LIPID PANEL
CHOL/HDL RATIO: 3 (calc) (ref <5.0)
Cholesterol: 213 mg/dL — ABNORMAL HIGH (ref <200)
HDL: 70 mg/dL (ref >50)
LDL Cholesterol (Calc): 118 mg/dL (calc) — ABNORMAL HIGH
NON-HDL CHOLESTEROL (CALC): 143 mg/dL — AB (ref <130)
Triglycerides: 136 mg/dL (ref <150)

## 2018-07-04 LAB — VITAMIN D 25 HYDROXY (VIT D DEFICIENCY, FRACTURES): Vit D, 25-Hydroxy: 30 ng/mL (ref 30–100)

## 2018-07-10 ENCOUNTER — Ambulatory Visit (INDEPENDENT_AMBULATORY_CARE_PROVIDER_SITE_OTHER): Payer: Medicare Other | Admitting: Internal Medicine

## 2018-07-10 ENCOUNTER — Encounter: Payer: Self-pay | Admitting: Internal Medicine

## 2018-07-10 VITALS — BP 110/80 | HR 68 | Ht 61.5 in | Wt 136.0 lb

## 2018-07-10 DIAGNOSIS — Z9049 Acquired absence of other specified parts of digestive tract: Secondary | ICD-10-CM

## 2018-07-10 DIAGNOSIS — M8580 Other specified disorders of bone density and structure, unspecified site: Secondary | ICD-10-CM

## 2018-07-10 DIAGNOSIS — G4700 Insomnia, unspecified: Secondary | ICD-10-CM | POA: Diagnosis not present

## 2018-07-10 DIAGNOSIS — E7800 Pure hypercholesterolemia, unspecified: Secondary | ICD-10-CM

## 2018-07-10 DIAGNOSIS — Z0000 Encounter for general adult medical examination without abnormal findings: Secondary | ICD-10-CM

## 2018-07-10 DIAGNOSIS — I10 Essential (primary) hypertension: Secondary | ICD-10-CM

## 2018-07-10 DIAGNOSIS — E039 Hypothyroidism, unspecified: Secondary | ICD-10-CM | POA: Diagnosis not present

## 2018-07-10 LAB — POCT URINALYSIS DIPSTICK
Appearance: NORMAL
BILIRUBIN UA: NEGATIVE
GLUCOSE UA: NEGATIVE
Ketones, UA: NEGATIVE
LEUKOCYTES UA: NEGATIVE
Nitrite, UA: NEGATIVE
ODOR: NORMAL
Protein, UA: NEGATIVE
Spec Grav, UA: 1.015 (ref 1.010–1.025)
Urobilinogen, UA: 0.2 E.U./dL
pH, UA: 6 (ref 5.0–8.0)

## 2018-07-10 MED ORDER — ZOLPIDEM TARTRATE 5 MG PO TABS: 5.0000 mg | ORAL_TABLET | Freq: Every evening | ORAL | 0 refills | Status: DC | PRN

## 2018-07-10 NOTE — Progress Notes (Signed)
Subjective:    Patient ID: Pamela [name], female    DOB: 1946-01-12, 72 y.o.   MRN: [id number]  HPI 72 year old Female for [facility], health maintenance and evaluation of medical issues.   Hx osteopenia. Last bone density was 04/20/18.Lowest T score -1.9. Continue with Vitamin D. Repeat bone density in 21-Apr-2019.  Shingrix vaccine discussed and Rx given.  Hx hypothyroidism- continue same dose.  Hypothyroidism onset in 20-Apr-1992.   Mild elevation of total and LDL cholesterol. Continue to monitor.  Continue Zocor 10 mg daily.  LDL has increased from 9 to 118.  Total cholesterol is increased from 182 to 213  Long-standing history of insomnia treated with Ambien  Left wrist fracture 1987 had to be repaired surgically.  Has urinary infections on occasion.  History of fibrocystic breast disease, diverticulosis of the colon status post laparoscopic colon resection.  History of ectopic pregnancies Apr 21, 1991, 1983 1985 resulting in laparotomy each time.  Patient had tonsillectomy 1950s.  In 1993/04/20 she had tubal repair laparotomy  Bone density study in 04/21/2011 showed T score in the LS spine of -1.7 and in the femoral neck of -2.1.  She has not wanted to be on bisphosphonate medication.  Social history: She is retired.  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  No history of smoking.  Occasional wine consumption.  She is married.  Family history: Father died in 20-Apr-2000 with history of coronary artery disease, CVA, MI diabetes.  Mother died at age 22 21 with history of hypertension and lung cancer.  3 brothers and one sister in good health.  No children.  The patient had a cousin who died of a sudden cardiac arrhythmia due to right ventricular dysplasia/cardiomyopathy.  Patient went to [facility] and was screened for that condition and was found not to have it.  Several family members have tested positive for the gene.        Review of Systems  Constitutional: Negative.     Eyes: Negative.   Respiratory: Negative.   Cardiovascular: Negative.   Gastrointestinal: Negative.   Genitourinary: Negative.   Neurological: Negative.   Psychiatric/Behavioral: Negative.        Objective:   Physical Exam  Constitutional: She is oriented to person, place, and time. She appears well-developed and well-nourished.  HENT:  Head: Normocephalic.  Right Ear: External ear normal.  Left Ear: External ear normal.  Mouth/Throat: Oropharynx is clear and moist.  Eyes: Pupils are equal, round, and reactive to light. Conjunctivae and EOM are normal. Right eye exhibits no discharge. Left eye exhibits no discharge. No scleral icterus.  Neck: Neck supple. No JVD present. No thyromegaly present.  Cardiovascular: Normal rate, regular rhythm, normal heart sounds and intact distal pulses. Exam reveals no friction rub.  No murmur heard. Pulmonary/Chest: Effort normal. No stridor. No respiratory distress. She has no wheezes.  Abdominal: Soft. Bowel sounds are normal. She exhibits no distension and no mass. There is no tenderness. There is no rebound and no guarding. No hernia.  Genitourinary:  Genitourinary Comments: Last Pap taken was March 2015.  Currently it is not being repeated after 72 years of age.  Bimanual normal.  Musculoskeletal: She exhibits no edema.  Neurological: She is oriented to person, place, and time.  Skin: Skin is warm and dry.  Psychiatric: She has a normal mood and affect. Her behavior is normal. Judgment and thought content normal.  Vitals reviewed.  Assessment & Plan:  Hypothyroidism-stable on thyroid replacement  History of vitamin D deficiency-take 2000 units vitamin D3 regularly  Hyperlipidemia-continue to monitor and current dose of statin  History of fibrocystic breast disease  History of insomnia-stable with Ambien  Osteopenia-does not want to be on bisphosphonate medication  Essential hypertension stable on current regimen  Plan:  Return in 6 months or as needed.  Subjective:   Patient presents for Medicare Annual/Subsequent preventive examination.  Review Past Medical/Family/Social: See above  Risk Factors  Current exercise habits: Exercises with yard work Dietary issues discussed: Low-fat low carbohydrate  Cardiac risk factors: Hyperlipidemia and family history  Depression Screen  (Note: if answer to either of the following is "Yes", a more complete depression screening is indicated)   Over the past two weeks, have you felt down, depressed or hopeless? No  Over the past two weeks, have you felt little interest or pleasure in doing things? No Have you lost interest or pleasure in daily life? No Do you often feel hopeless? No Do you cry easily over simple problems? No   Activities of Daily Living  In your present state of health, do you have any difficulty performing the following activities?:   Driving? No  Managing money? No  Feeding yourself? No  Getting from bed to chair? No  Climbing a flight of stairs? No  Preparing food and eating?: No  Bathing or showering? No  Getting dressed: No  Getting to the toilet? No  Using the toilet:No  Moving around from place to place: No  In the past year have you fallen or had a near fall?:  Yes Are you sexually active? No  Do you have more than one partner? No   Hearing Difficulties: No  Do you often ask people to speak up or repeat themselves? No  Do you experience ringing or noises in your ears? No  Do you have difficulty understanding soft or whispered voices? No  Do you feel that you have a problem with memory? No Do you often misplace items? No    Home Safety:  Do you have a smoke alarm at your residence? Yes Do you have grab bars in the bathroom?  Yes Do you have throw rugs in your house?  Yes   Cognitive Testing  Alert? Yes Normal Appearance?Yes  Oriented to person? Yes Place? Yes  Time? Yes  Recall of three objects? Yes  Can perform  simple calculations? Yes  Displays appropriate judgment?Yes  Can read the correct time from a watch face?Yes   List the Names of Other Physician/Practitioners you currently use:  See referral list for the physicians patient is currently seeing.     Review of Systems: See above   Objective:     General appearance: Appears stated age  Head: Normocephalic, without obvious abnormality, atraumatic  Eyes: conj clear, EOMi PEERLA  Ears: normal TM's and external ear canals both ears  Nose: Nares normal. Septum midline. Mucosa normal. No drainage or sinus tenderness.  Throat: lips, mucosa, and tongue normal; teeth and gums normal  Neck: no adenopathy, no carotid bruit, no JVD, supple, symmetrical, trachea midline and thyroid not enlarged, symmetric, no tenderness/mass/nodules  No CVA tenderness.  Lungs: clear to auscultation bilaterally  Breasts: normal appearance, no masses or tenderness Heart: regular rate and rhythm, S1, S2 normal, no murmur, click, rub or gallop  Abdomen: soft, non-tender; bowel sounds normal; no masses, no organomegaly  Musculoskeletal: ROM normal in all joints, no crepitus, no deformity, Normal  muscle strengthen. Back  is symmetric, no curvature. Skin: Skin color, texture, turgor normal. No rashes or lesions  Lymph nodes: Cervical, supraclavicular, and axillary nodes normal.  Neurologic: CN 2 -12 Normal, Normal symmetric reflexes. Normal coordination and gait  Psych: Alert & Oriented x 3, Mood appear stable.    Assessment:    Annual wellness medicare exam   Plan:    During the course of the visit the patient was educated and counseled about appropriate screening and preventive services including:   Annual mammogram  Annual flu vaccine     Patient Instructions (the written plan) was given to the patient.  Medicare Attestation  I have personally reviewed:  The patient's medical and social history  Their use of alcohol, tobacco or illicit drugs  Their  current medications and supplements  The patient's functional ability including ADLs,fall risks, home safety risks, cognitive, and hearing and visual impairment  Diet and physical activities  Evidence for depression or mood disorders  The patient's weight, height, BMI, and visual acuity have been recorded in the chart. I have made referrals, counseling, and provided education to the patient based on review of the above and I have provided the patient with a written personalized care plan for preventive services.

## 2018-07-29 NOTE — Patient Instructions (Signed)
It was a pleasure to see you today.  Continue thyroid replacement.  Take vitamin D regularly.  Follow-up in 6 months.  Continue statin medication and watch diet.

## 2018-08-09 ENCOUNTER — Other Ambulatory Visit: Payer: Self-pay | Admitting: Internal Medicine

## 2018-09-07 NOTE — Telephone Encounter (Signed)
Error

## 2018-09-24 ENCOUNTER — Other Ambulatory Visit: Payer: Self-pay | Admitting: Internal Medicine

## 2018-12-05 ENCOUNTER — Telehealth: Payer: Self-pay | Admitting: Internal Medicine

## 2018-12-05 ENCOUNTER — Encounter: Payer: Self-pay | Admitting: Internal Medicine

## 2018-12-05 ENCOUNTER — Ambulatory Visit (INDEPENDENT_AMBULATORY_CARE_PROVIDER_SITE_OTHER): Payer: Medicare Other | Admitting: Internal Medicine

## 2018-12-05 VITALS — BP 120/70 | HR 78 | Temp 98.1°F | Ht 62.0 in | Wt 138.0 lb

## 2018-12-05 DIAGNOSIS — J22 Unspecified acute lower respiratory infection: Secondary | ICD-10-CM | POA: Diagnosis not present

## 2018-12-05 MED ORDER — FLUCONAZOLE 150 MG PO TABS: 150.0000 mg | ORAL_TABLET | Freq: Once | ORAL | 0 refills | Status: AC

## 2018-12-05 MED ORDER — DOXYCYCLINE HYCLATE 100 MG PO TABS: 100.0000 mg | ORAL_TABLET | Freq: Two times a day (BID) | ORAL | 0 refills | Status: DC

## 2018-12-05 NOTE — Telephone Encounter (Signed)
Appointment scheduled.

## 2018-12-05 NOTE — Telephone Encounter (Signed)
Patient called stating that she has been having on going sinus congestion, a cough, and yellow/green mucus for 2 weeks.  When would you like for me to schedule this patient to be seen? Please advise.

## 2018-12-05 NOTE — Telephone Encounter (Signed)
430 pm today

## 2018-12-05 NOTE — Progress Notes (Signed)
   Subjective:    Patient ID: Pamela [name], female    DOB: Sep 24, 1946, 73 y.o.   MRN: [id number]  HPI 73 year Female has developed a cough over the holidays that will not go away.  Cough is slightly productive.  No fever or shaking chills or flulike symptoms. Her general health is good.  Usually does not get sick with respiratory illnesses.   Review of Systems no myalgias, sore throat or ear pain     Objective:   Physical Exam Skin warm and dry.  Nodes none.  Neck is supple without adenopathy.  TMs and pharynx are clear.  Chest is clear to auscultation without rales or wheezing.  Vital signs reviewed.      Assessment & Plan:  Acute lower respiratory infection  Plan: Doxycycline 100 mg twice daily for 10 days.  Rest and drink plenty of fluids.  Call if not better in 10 days or sooner if worse.  15 minutes spent face-to-face with patient

## 2018-12-20 ENCOUNTER — Other Ambulatory Visit: Payer: Self-pay | Admitting: Internal Medicine

## 2018-12-28 NOTE — Patient Instructions (Signed)
Doxycycline 100 mg twice daily for 10 days.  Rest and drink plenty of fluids.  Call if not better in 10 days or sooner if worse.

## 2019-03-20 ENCOUNTER — Other Ambulatory Visit: Payer: Self-pay | Admitting: Internal Medicine

## 2019-03-20 MED ORDER — LEVOTHYROXINE SODIUM 88 MCG PO TABS: ORAL_TABLET | ORAL | 0 refills | Status: DC

## 2019-03-20 NOTE — Telephone Encounter (Signed)
Left message to call back needs TSH and 6 month f/u appt.

## 2019-03-20 NOTE — Telephone Encounter (Signed)
Received Network engineer request from  Pharmacy - [facility] - [address]  Medication - levothyroxine (SYNTHROID, LEVOTHROID) 88 MCG tablet   Last Refill -1.22.20  Last OV - 1.7.20  Last CPE - 8.12.19

## 2019-04-10 ENCOUNTER — Other Ambulatory Visit: Payer: Self-pay

## 2019-06-21 ENCOUNTER — Other Ambulatory Visit: Payer: Self-pay

## 2019-06-21 MED ORDER — LEVOTHYROXINE SODIUM 88 MCG PO TABS: ORAL_TABLET | ORAL | 0 refills | Status: DC

## 2019-06-26 ENCOUNTER — Other Ambulatory Visit: Payer: Self-pay | Admitting: Internal Medicine

## 2019-07-13 ENCOUNTER — Other Ambulatory Visit: Payer: Medicare Other | Admitting: Internal Medicine

## 2019-07-13 ENCOUNTER — Other Ambulatory Visit: Payer: Self-pay

## 2019-07-13 ENCOUNTER — Encounter: Payer: Medicare Other | Admitting: Internal Medicine

## 2019-07-13 DIAGNOSIS — E7800 Pure hypercholesterolemia, unspecified: Secondary | ICD-10-CM | POA: Diagnosis not present

## 2019-07-13 DIAGNOSIS — Z0000 Encounter for general adult medical examination without abnormal findings: Secondary | ICD-10-CM

## 2019-07-13 DIAGNOSIS — I10 Essential (primary) hypertension: Secondary | ICD-10-CM | POA: Diagnosis not present

## 2019-07-13 DIAGNOSIS — G4700 Insomnia, unspecified: Secondary | ICD-10-CM

## 2019-07-13 DIAGNOSIS — E039 Hypothyroidism, unspecified: Secondary | ICD-10-CM | POA: Diagnosis not present

## 2019-07-14 LAB — TSH: TSH: 0.63 mIU/L (ref 0.40–4.50)

## 2019-07-14 LAB — COMPLETE METABOLIC PANEL WITH GFR
AG Ratio: 1.8 (calc) (ref 1.0–2.5)
ALT: 12 U/L (ref 6–29)
AST: 16 U/L (ref 10–35)
Albumin: 4.4 g/dL (ref 3.6–5.1)
Alkaline phosphatase (APISO): 57 U/L (ref 37–153)
BUN: 12 mg/dL (ref 7–25)
CO2: 28 mmol/L (ref 20–32)
Calcium: 9.9 mg/dL (ref 8.6–10.4)
Chloride: 104 mmol/L (ref 98–110)
Creat: 0.77 mg/dL (ref 0.60–0.93)
GFR, Est African American: 90 mL/min/{1.73_m2} (ref >=60)
GFR, Est Non African American: 78 mL/min/{1.73_m2} (ref >=60)
Globulin: 2.4 g/dL (calc) (ref 1.9–3.7)
Glucose, Bld: 98 mg/dL (ref 65–99)
Potassium: 4.6 mmol/L (ref 3.5–5.3)
Sodium: 141 mmol/L (ref 135–146)
Total Bilirubin: 0.5 mg/dL (ref 0.2–1.2)
Total Protein: 6.8 g/dL (ref 6.1–8.1)

## 2019-07-14 LAB — LIPID PANEL
Cholesterol: 204 mg/dL — ABNORMAL HIGH (ref <200)
HDL: 78 mg/dL (ref >=50)
LDL Cholesterol (Calc): 104 mg/dL (calc) — ABNORMAL HIGH
Non-HDL Cholesterol (Calc): 126 mg/dL (calc) (ref <130)
Total CHOL/HDL Ratio: 2.6 (calc) (ref <5.0)
Triglycerides: 123 mg/dL (ref <150)

## 2019-07-14 LAB — CBC WITH DIFFERENTIAL/PLATELET
Absolute Monocytes: 291 cells/uL (ref 200–950)
Basophils Absolute: 21 cells/uL (ref 0–200)
Basophils Relative: 0.5 %
Eosinophils Absolute: 82 cells/uL (ref 15–500)
Eosinophils Relative: 2 %
HCT: 40.6 % (ref 35.0–45.0)
Hemoglobin: 13.6 g/dL (ref 11.7–15.5)
Lymphs Abs: 1447 cells/uL (ref 850–3900)
MCH: 30.4 pg (ref 27.0–33.0)
MCHC: 33.5 g/dL (ref 32.0–36.0)
MCV: 90.8 fL (ref 80.0–100.0)
MPV: 11.4 fL (ref 7.5–12.5)
Monocytes Relative: 7.1 %
Neutro Abs: 2259 cells/uL (ref 1500–7800)
Neutrophils Relative %: 55.1 %
Platelets: 244 10*3/uL (ref 140–400)
RBC: 4.47 10*6/uL (ref 3.80–5.10)
RDW: 12.4 % (ref 11.0–15.0)
Total Lymphocyte: 35.3 %
WBC: 4.1 10*3/uL (ref 3.8–10.8)

## 2019-07-16 ENCOUNTER — Ambulatory Visit (INDEPENDENT_AMBULATORY_CARE_PROVIDER_SITE_OTHER): Payer: Medicare Other | Admitting: Internal Medicine

## 2019-07-16 ENCOUNTER — Other Ambulatory Visit: Payer: Self-pay

## 2019-07-16 ENCOUNTER — Encounter: Payer: Self-pay | Admitting: Internal Medicine

## 2019-07-16 VITALS — BP 130/80 | HR 73 | Temp 98.1°F | Ht 61.0 in | Wt 137.0 lb

## 2019-07-16 DIAGNOSIS — G4700 Insomnia, unspecified: Secondary | ICD-10-CM

## 2019-07-16 DIAGNOSIS — Z9049 Acquired absence of other specified parts of digestive tract: Secondary | ICD-10-CM | POA: Diagnosis not present

## 2019-07-16 DIAGNOSIS — Z20828 Contact with and (suspected) exposure to other viral communicable diseases: Secondary | ICD-10-CM

## 2019-07-16 DIAGNOSIS — Z20822 Contact with and (suspected) exposure to covid-19: Secondary | ICD-10-CM

## 2019-07-16 DIAGNOSIS — Z0000 Encounter for general adult medical examination without abnormal findings: Secondary | ICD-10-CM | POA: Diagnosis not present

## 2019-07-16 DIAGNOSIS — M8580 Other specified disorders of bone density and structure, unspecified site: Secondary | ICD-10-CM | POA: Diagnosis not present

## 2019-07-16 DIAGNOSIS — E039 Hypothyroidism, unspecified: Secondary | ICD-10-CM | POA: Diagnosis not present

## 2019-07-16 DIAGNOSIS — I10 Essential (primary) hypertension: Secondary | ICD-10-CM | POA: Diagnosis not present

## 2019-07-16 DIAGNOSIS — E7800 Pure hypercholesterolemia, unspecified: Secondary | ICD-10-CM

## 2019-07-16 LAB — POCT URINALYSIS DIPSTICK
Appearance: NEGATIVE
Bilirubin, UA: NEGATIVE
Blood, UA: NEGATIVE
Glucose, UA: NEGATIVE
Ketones, UA: NEGATIVE
Leukocytes, UA: NEGATIVE
Nitrite, UA: NEGATIVE
Odor: NEGATIVE
Protein, UA: NEGATIVE
Spec Grav, UA: 1.01 (ref 1.010–1.025)
Urobilinogen, UA: 0.2 E.U./dL
pH, UA: 6 (ref 5.0–8.0)

## 2019-07-16 NOTE — Patient Instructions (Signed)
Screen for Covid 19 antibody. Labs are WNL. RTC in 6 months to follow up on hypothyroidism.

## 2019-07-16 NOTE — Progress Notes (Signed)
Subjective:    Patient ID: Pamela [name], female    DOB: 25-Jul-1946, 73 y.o.   MRN: [id number]  HPI 73 year old Female for health maintenance exam and evaluation of medical issues.   She has a history of osteopenia, hypothyroidism, hyperlipidemia treated with Zocor.  Last bone density study was May 2019 and lowest T score was -1.9.  Continue vitamin D supplement.  Longstanding history of insomnia treated with Ambien.  Left wrist fracture 1987 had to be repaired surgically.  Had urinary infections on occasion.  History of fibrocystic breast disease, diverticulosis of the colon status post laparoscopic colon resection.  Had 3 ectopic pregnancies [place] 8 resulting in laparotomy.  Patient had tonsillectomy in the 1950s.  In 01/24/93 she had tubal repair laparotomy.  Bone density study 01-24-2011 showed T score in the LS spine of -1.7 and -2.1 in the femoral neck.  She has not wanted to be on bisphosphonate medication.  Social history: She is retired.  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  No history of smoking.  Occasional wine consumption.  She is married.  Family history: Father died in 2000/01/25 with history of coronary artery disease CVA MI diabetes.  Mother died at age 63 in 56 with history of hypertension and lung cancer.  3 brothers and 1 sister in good health.  No children.  Patient had a cousin who died of a sudden cardiac arrhythmia due to right ventricular dysplasia/cardiomyopathy.  Patient went to [facility] and was screened for that condition and was found not to have it.  Several family members have tested positive for the gene.  Her lipids are stable with total cholesterol 204 and LDL cholesterol 104 with normal triglycerides at 123 and HDL of 78.  TSH is normal.  She wanted antibody to COVID-19.  We did that screening and it was negative.  CBC and C met are normal.  Needs annual mammogram.  Last one was done May 2019.  Recommend repeat bone  density study next year.  Does not want to be on bisphosphonate.    Review of Systems  Constitutional: Negative.   Respiratory: Negative.   Cardiovascular: Negative.   Gastrointestinal: Negative.   Genitourinary: Negative.   Neurological: Negative.   Psychiatric/Behavioral: Negative.        Objective:   Physical Exam Blood pressure 130/80, pulse 73 temperature 98.1 weight 137 pounds height 5 feet 1 inches BMI 25.89 skin warm and dry.  Nodes none.  TMs and pharynx are clear.  Neck is supple without JVD thyromegaly or carotid bruits.  Chest clear to auscultation.  Cardiac exam regular rate and rhythm normal S1 and S2.  Extremities without edema.  Neuro no focal deficits on brief neurological exam.  Affect and judgment are normal.       Assessment & Plan:  Hypothyroidism-continue same dose of thyroid replacement  Osteopenia-repeat bone density study next year.  Does not want to be on bisphosphonate therapy  Hyperlipidemia-stable on statin therapy  Insomnia-continue Ambien  Plan: Recommend annual flu vaccine and annual mammogram.  Bone density study next year.  Subjective:   Patient presents for Medicare Annual/Subsequent preventive examination.  Review Past Medical/Family/Social: See above   Risk Factors  Current exercise habits: Works in yard a lot Dietary issues discussed: Low-fat low carbohydrate  Cardiac risk factors: Hyperlipidemia and family history  Depression Screen  (Note: if answer to either of the following is "Yes", a more complete  depression screening is indicated)   Over the past two weeks, have you felt down, depressed or hopeless? No  Over the past two weeks, have you felt little interest or pleasure in doing things? No Have you lost interest or pleasure in daily life? No Do you often feel hopeless? No Do you cry easily over simple problems? No   Activities of Daily Living  In your present state of health, do you have any difficulty performing the  following activities?:   Driving? No  Managing money? No  Feeding yourself? No  Getting from bed to chair? No  Climbing a flight of stairs? No  Preparing food and eating?: No  Bathing or showering? No  Getting dressed: No  Getting to the toilet? No  Using the toilet:No  Moving around from place to place: No  In the past year have you fallen or had a near fall?:  Yes Are you sexually active? No  Do you have more than one partner? No   Hearing Difficulties: No  Do you often ask people to speak up or repeat themselves? No  Do you experience ringing or noises in your ears? No  Do you have difficulty understanding soft or whispered voices? No  Do you feel that you have a problem with memory? No Do you often misplace items? No    Home Safety:  Do you have a smoke alarm at your residence? Yes Do you have grab bars in the bathroom?  Yes  do you have throw rugs in your house?  Yes   Cognitive Testing  Alert? Yes Normal Appearance?Yes  Oriented to person? Yes Place? Yes  Time? Yes  Recall of three objects? Yes  Can perform simple calculations? Yes  Displays appropriate judgment?Yes  Can read the correct time from a watch face?Yes   List the Names of Other Physician/Practitioners you currently use:  See referral list for the physicians patient is currently seeing.     Review of Systems: See above   Objective:     General appearance: Appears younger than stated age Head: Normocephalic, without obvious abnormality, atraumatic  Eyes: conj clear, EOMi PEERLA  Ears: normal TM's and external ear canals both ears  Nose: Nares normal. Septum midline. Mucosa normal. No drainage or sinus tenderness.  Throat: lips, mucosa, and tongue normal; teeth and gums normal  Neck: no adenopathy, no carotid bruit, no JVD, supple, symmetrical, trachea midline and thyroid not enlarged, symmetric, no tenderness/mass/nodules  No CVA tenderness.  Lungs: clear to auscultation bilaterally   Breasts: normal appearance, no masses or tenderness Heart: regular rate and rhythm, S1, S2 normal, no murmur, click, rub or gallop  Abdomen: soft, non-tender; bowel sounds normal; no masses, no organomegaly  Musculoskeletal: ROM normal in all joints, no crepitus, no deformity, Normal muscle strengthen. Back  is symmetric, no curvature. Skin: Skin color, texture, turgor normal. No rashes or lesions  Lymph nodes: Cervical, supraclavicular, and axillary nodes normal.  Neurologic: CN 2 -12 Normal, Normal symmetric reflexes. Normal coordination and gait  Psych: Alert & Oriented x 3, Mood appear stable.    Assessment:    Annual wellness medicare exam   Plan:    During the course of the visit the patient was educated and counseled about appropriate screening and preventive services including:   Annual flu vaccine  Annual mammogram     Patient Instructions (the written plan) was given to the patient.  Medicare Attestation  I have personally reviewed:  The patient's medical and social  history  Their use of alcohol, tobacco or illicit drugs  Their current medications and supplements  The patient's functional ability including ADLs,fall risks, home safety risks, cognitive, and hearing and visual impairment  Diet and physical activities  Evidence for depression or mood disorders  The patient's weight, height, BMI, and visual acuity have been recorded in the chart. I have made referrals, counseling, and provided education to the patient based on review of the above and I have provided the patient with a written personalized care plan for preventive services.

## 2019-07-17 LAB — SAR COV2 SEROLOGY (COVID19)AB(IGG),IA: SARS CoV2 AB IGG: NEGATIVE

## 2019-07-20 ENCOUNTER — Ambulatory Visit: Payer: Medicare Other | Admitting: Internal Medicine

## 2019-08-14 ENCOUNTER — Telehealth: Payer: Self-pay | Admitting: Internal Medicine

## 2019-08-14 ENCOUNTER — Encounter: Payer: Self-pay | Admitting: Internal Medicine

## 2019-08-14 NOTE — Telephone Encounter (Signed)
Spoke with patient regarding her concern regarding dental visit where a Panorex film was done.  Dentist at [facility], Dr. Clifton James, apparently noticed some abnormal finding in her neck on Panorex film.She is not sure if it is a mass or carotid plaque that he is concerned about.  His office contacted front office rep here a few days ago regarding our fax number and wanted to email the Panorex film and fax notes.  We have not received any notes.  We told the office staff that I am not qualified to read a Panorex film.  Also, we did not want Panorex e-mailed here due to [place] regulations.  Patient called today asking if we had received this information and what the next steps would be.  I am not sure what the question is about the film.  I told the patient that I am not qualified to read the Panorex film.  Perhaps it could be overread  at [facility].    We have not heard from Dr. Burna Sis office nor received notes.  Patient says she will contact Dr. Burna Sis office tomorrow.

## 2019-08-15 ENCOUNTER — Other Ambulatory Visit: Payer: Self-pay | Admitting: Internal Medicine

## 2019-08-15 ENCOUNTER — Encounter: Payer: Self-pay | Admitting: Internal Medicine

## 2019-08-15 ENCOUNTER — Telehealth (INDEPENDENT_AMBULATORY_CARE_PROVIDER_SITE_OTHER): Payer: Medicare Other | Admitting: Internal Medicine

## 2019-08-15 DIAGNOSIS — I6521 Occlusion and stenosis of right carotid artery: Secondary | ICD-10-CM | POA: Diagnosis not present

## 2019-08-15 DIAGNOSIS — I6529 Occlusion and stenosis of unspecified carotid artery: Secondary | ICD-10-CM

## 2019-08-15 NOTE — Telephone Encounter (Signed)
We have received a printed copy of Panorex film done Sept 15. There appears to be a calcification of some kind in right neck. Pt concerned because of family history of carotid disease. This could be a calcification in the carotids. I am going to refer her to ENT for evaluation and order carotid doppler studies. We had discussed having film over read by [facility]. However after discussing report just received this am from dental office with her by phone, we will procede with ENT evaluation.

## 2019-08-16 ENCOUNTER — Ambulatory Visit (HOSPITAL_COMMUNITY)
Admission: RE | Admit: 2019-08-16 | Discharge: 2019-08-16 | Disposition: A | Discharge status: Home / Self Care | Payer: Medicare Other | Source: Ambulatory Visit | Attending: Internal Medicine | Admitting: Internal Medicine

## 2019-08-16 ENCOUNTER — Other Ambulatory Visit: Payer: Self-pay

## 2019-08-16 DIAGNOSIS — I6529 Occlusion and stenosis of unspecified carotid artery: Secondary | ICD-10-CM | POA: Insufficient documentation

## 2019-08-22 DIAGNOSIS — Z711 Person with feared health complaint in whom no diagnosis is made: Secondary | ICD-10-CM | POA: Diagnosis not present

## 2019-09-14 ENCOUNTER — Telehealth: Payer: Self-pay | Admitting: Internal Medicine

## 2019-09-14 MED ORDER — LEVOTHYROXINE SODIUM 88 MCG PO TABS: ORAL_TABLET | ORAL | 0 refills | Status: DC

## 2019-09-14 NOTE — Telephone Encounter (Signed)
Received Fax RX request from  [place]  Medication - levothyroxine (SYNTHROID) 88 MCG tablet   Last Refill - 06/21/19  Last OV - 07/16/19  Last CPE - 07/16/19

## 2019-12-12 ENCOUNTER — Telehealth: Payer: Self-pay | Admitting: Podiatry

## 2019-12-12 NOTE — Telephone Encounter (Signed)
Pt left message yesterday asking about getting a new pair of orthotics.She has had hers for several years.  I returned call and pt has [employer] medicare and they will not cover orthotics. The new price is 438.00.  I also explained that we could refurbish the ones she has for 90.00 and that would take between 2 to 3 weeks if she wanted to do that.  She is going to think about it and get back to me.

## 2019-12-13 ENCOUNTER — Other Ambulatory Visit: Payer: Self-pay

## 2019-12-13 MED ORDER — LEVOTHYROXINE SODIUM 88 MCG PO TABS: ORAL_TABLET | ORAL | 1 refills | Status: DC

## 2019-12-19 ENCOUNTER — Ambulatory Visit: Payer: Medicare Other | Attending: Internal Medicine

## 2019-12-19 DIAGNOSIS — Z23 Encounter for immunization: Secondary | ICD-10-CM

## 2019-12-19 NOTE — Progress Notes (Signed)
   Covid-19 Vaccination Clinic  Name:  Pamela [name]    MRN: [id number] DOB: Apr 23, 1946  12/19/2019  [name] was observed post Covid-19 immunization for 15 minutes without incidence. She was provided with Vaccine Information Sheet and instruction to access the V-Safe system.   [name] was instructed to call 911 with any severe reactions post vaccine: Marland Kitchen Difficulty breathing  . Swelling of your face and throat  . A fast heartbeat  . A bad rash all over your body  . Dizziness and weakness    Immunizations Administered    Name Date Dose VIS Date Route   Pfizer COVID-19 Vaccine 12/19/2019  2:32 PM 0.3 mL 11/09/2019 Intramuscular   Manufacturer: [employer], [employer]   Lot: [id number]   NDC: [address]-6

## 2020-01-07 ENCOUNTER — Ambulatory Visit: Payer: Medicare Other | Attending: Internal Medicine

## 2020-01-07 DIAGNOSIS — Z23 Encounter for immunization: Secondary | ICD-10-CM | POA: Insufficient documentation

## 2020-01-07 NOTE — Progress Notes (Signed)
   Covid-19 Vaccination Clinic  Name:  Pamela [name]    MRN: [id number] DOB: 04-Jan-1946  01/07/2020  [name] was observed post Covid-19 immunization for 15 minutes without incidence. She was provided with Vaccine Information Sheet and instruction to access the V-Safe system.   [name] was instructed to call 911 with any severe reactions post vaccine: Marland Kitchen Difficulty breathing  . Swelling of your face and throat  . A fast heartbeat  . A bad rash all over your body  . Dizziness and weakness    Immunizations Administered    Name Date Dose VIS Date Route   Pfizer COVID-19 Vaccine 01/07/2020 10:20 AM 0.3 mL 11/09/2019 Intramuscular   Manufacturer: [employer], [employer]   Lot: [id number]   NDC: [address]-8

## 2020-01-22 ENCOUNTER — Ambulatory Visit: Payer: Medicare Other

## 2020-03-05 ENCOUNTER — Other Ambulatory Visit: Payer: Self-pay | Admitting: Internal Medicine

## 2020-03-05 DIAGNOSIS — Z1231 Encounter for screening mammogram for malignant neoplasm of breast: Secondary | ICD-10-CM

## 2020-03-20 ENCOUNTER — Other Ambulatory Visit: Payer: Self-pay

## 2020-03-20 ENCOUNTER — Ambulatory Visit
Admission: RE | Admit: 2020-03-20 | Discharge: 2020-03-20 | Disposition: A | Discharge status: Home / Self Care | Payer: Medicare Other | Source: Ambulatory Visit | Attending: Internal Medicine | Admitting: Internal Medicine

## 2020-03-20 DIAGNOSIS — Z1231 Encounter for screening mammogram for malignant neoplasm of breast: Secondary | ICD-10-CM

## 2020-06-10 ENCOUNTER — Telehealth: Payer: Self-pay | Admitting: Internal Medicine

## 2020-06-10 DIAGNOSIS — H401132 Primary open-angle glaucoma, bilateral, moderate stage: Secondary | ICD-10-CM | POA: Diagnosis not present

## 2020-06-10 MED ORDER — SIMVASTATIN 10 MG PO TABS: ORAL_TABLET | ORAL | 3 refills | Status: DC

## 2020-06-10 MED ORDER — LEVOTHYROXINE SODIUM 88 MCG PO TABS: ORAL_TABLET | ORAL | 0 refills | Status: DC

## 2020-06-10 NOTE — Telephone Encounter (Signed)
CPE due in August please book before refilling

## 2020-06-10 NOTE — Telephone Encounter (Signed)
Received Fax RX request from  [facility] #[id number] - [place], [place] - [address] AT [facility] OF [address] Phone:  [phone]  Fax:  [phone]       Medication - simvastatin (ZOCOR) 10 MG tablet levothyroxine (SYNTHROID) 88 MCG tablet   Last Refill - 03/12/2020  Last OV - 07/16/19  Last CPE - 07/16/19  Next Appointment -

## 2020-06-11 ENCOUNTER — Ambulatory Visit: Payer: Medicare Other | Attending: Internal Medicine

## 2020-06-11 DIAGNOSIS — Z20822 Contact with and (suspected) exposure to covid-19: Secondary | ICD-10-CM | POA: Diagnosis not present

## 2020-06-12 LAB — SARS-COV-2, NAA 2 DAY TAT

## 2020-06-12 LAB — NOVEL CORONAVIRUS, NAA: SARS-CoV-2, NAA: NOT DETECTED

## 2020-07-14 DIAGNOSIS — H401132 Primary open-angle glaucoma, bilateral, moderate stage: Secondary | ICD-10-CM | POA: Diagnosis not present

## 2020-07-21 ENCOUNTER — Other Ambulatory Visit: Payer: Self-pay

## 2020-07-21 ENCOUNTER — Other Ambulatory Visit: Payer: Medicare Other | Admitting: Internal Medicine

## 2020-07-21 DIAGNOSIS — M8580 Other specified disorders of bone density and structure, unspecified site: Secondary | ICD-10-CM

## 2020-07-21 DIAGNOSIS — E7800 Pure hypercholesterolemia, unspecified: Secondary | ICD-10-CM

## 2020-07-21 DIAGNOSIS — Z0000 Encounter for general adult medical examination without abnormal findings: Secondary | ICD-10-CM | POA: Diagnosis not present

## 2020-07-21 DIAGNOSIS — I10 Essential (primary) hypertension: Secondary | ICD-10-CM

## 2020-07-21 DIAGNOSIS — E039 Hypothyroidism, unspecified: Secondary | ICD-10-CM | POA: Diagnosis not present

## 2020-07-21 DIAGNOSIS — G4700 Insomnia, unspecified: Secondary | ICD-10-CM

## 2020-07-22 LAB — CBC WITH DIFFERENTIAL/PLATELET
Absolute Monocytes: 329 cells/uL (ref 200–950)
Basophils Absolute: 41 cells/uL (ref 0–200)
Basophils Relative: 0.9 %
Eosinophils Absolute: 81 cells/uL (ref 15–500)
Eosinophils Relative: 1.8 %
HCT: 40.5 % (ref 35.0–45.0)
Hemoglobin: 13.4 g/dL (ref 11.7–15.5)
Lymphs Abs: 1719 cells/uL (ref 850–3900)
MCH: 30.5 pg (ref 27.0–33.0)
MCHC: 33.1 g/dL (ref 32.0–36.0)
MCV: 92 fL (ref 80.0–100.0)
MPV: 11.7 fL (ref 7.5–12.5)
Monocytes Relative: 7.3 %
Neutro Abs: 2331 cells/uL (ref 1500–7800)
Neutrophils Relative %: 51.8 %
Platelets: 244 10*3/uL (ref 140–400)
RBC: 4.4 10*6/uL (ref 3.80–5.10)
RDW: 12.9 % (ref 11.0–15.0)
Total Lymphocyte: 38.2 %
WBC: 4.5 10*3/uL (ref 3.8–10.8)

## 2020-07-22 LAB — COMPLETE METABOLIC PANEL WITH GFR
AG Ratio: 1.9 (calc) (ref 1.0–2.5)
ALT: 12 U/L (ref 6–29)
AST: 18 U/L (ref 10–35)
Albumin: 4.3 g/dL (ref 3.6–5.1)
Alkaline phosphatase (APISO): 55 U/L (ref 37–153)
BUN: 14 mg/dL (ref 7–25)
CO2: 22 mmol/L (ref 20–32)
Calcium: 9.2 mg/dL (ref 8.6–10.4)
Chloride: 108 mmol/L (ref 98–110)
Creat: 0.74 mg/dL (ref 0.60–0.93)
GFR, Est African American: 94 mL/min/{1.73_m2} (ref >=60)
GFR, Est Non African American: 81 mL/min/{1.73_m2} (ref >=60)
Globulin: 2.3 g/dL (calc) (ref 1.9–3.7)
Glucose, Bld: 82 mg/dL (ref 65–99)
Potassium: 5 mmol/L (ref 3.5–5.3)
Sodium: 140 mmol/L (ref 135–146)
Total Bilirubin: 0.5 mg/dL (ref 0.2–1.2)
Total Protein: 6.6 g/dL (ref 6.1–8.1)

## 2020-07-22 LAB — LIPID PANEL
Cholesterol: 194 mg/dL (ref <200)
HDL: 67 mg/dL (ref >=50)
LDL Cholesterol (Calc): 108 mg/dL (calc) — ABNORMAL HIGH
Non-HDL Cholesterol (Calc): 127 mg/dL (calc) (ref <130)
Total CHOL/HDL Ratio: 2.9 (calc) (ref <5.0)
Triglycerides: 96 mg/dL (ref <150)

## 2020-07-22 LAB — TSH: TSH: 0.88 mIU/L (ref 0.40–4.50)

## 2020-07-28 ENCOUNTER — Ambulatory Visit (INDEPENDENT_AMBULATORY_CARE_PROVIDER_SITE_OTHER): Payer: Medicare Other | Admitting: Internal Medicine

## 2020-07-28 ENCOUNTER — Encounter: Payer: Self-pay | Admitting: Internal Medicine

## 2020-07-28 ENCOUNTER — Other Ambulatory Visit: Payer: Self-pay

## 2020-07-28 VITALS — BP 120/80 | HR 61 | Ht 61.0 in | Wt 142.0 lb

## 2020-07-28 DIAGNOSIS — Z0000 Encounter for general adult medical examination without abnormal findings: Secondary | ICD-10-CM

## 2020-07-28 DIAGNOSIS — I10 Essential (primary) hypertension: Secondary | ICD-10-CM

## 2020-07-28 DIAGNOSIS — Z23 Encounter for immunization: Secondary | ICD-10-CM | POA: Diagnosis not present

## 2020-07-28 DIAGNOSIS — G4700 Insomnia, unspecified: Secondary | ICD-10-CM | POA: Diagnosis not present

## 2020-07-28 DIAGNOSIS — E039 Hypothyroidism, unspecified: Secondary | ICD-10-CM | POA: Diagnosis not present

## 2020-07-28 DIAGNOSIS — M8580 Other specified disorders of bone density and structure, unspecified site: Secondary | ICD-10-CM

## 2020-07-28 DIAGNOSIS — Z9049 Acquired absence of other specified parts of digestive tract: Secondary | ICD-10-CM

## 2020-07-28 DIAGNOSIS — E7800 Pure hypercholesterolemia, unspecified: Secondary | ICD-10-CM

## 2020-07-28 LAB — POCT URINALYSIS DIPSTICK
Appearance: NEGATIVE
Bilirubin, UA: NEGATIVE
Blood, UA: NEGATIVE
Glucose, UA: NEGATIVE
Ketones, UA: NEGATIVE
Leukocytes, UA: NEGATIVE
Nitrite, UA: NEGATIVE
Odor: NEGATIVE
Protein, UA: NEGATIVE
Spec Grav, UA: 1.01 (ref 1.010–1.025)
Urobilinogen, UA: 0.2 E.U./dL
pH, UA: 6 (ref 5.0–8.0)

## 2020-07-28 NOTE — Progress Notes (Signed)
Subjective:    Patient ID: Pamela [name], female    DOB: [birth year], 74 y.o.   MRN: [id number]  HPI 74 year old Female in today for Medicare wellness, health maintenance exam, evaluation of medical issues.  She has a history of osteopenia, hypothyroidism, hyperlipidemia treated with Zocor.  Had bone density study May 2019 with lowest T score -1.9. Takes vitamin D supplementation.  Longstanding history of insomnia treated with Ambien.  Left wrist fracture 1987 had to be repaired surgically  Has had urinary infections on occasion  History of fibrocystic breast disease, diverticulosis of the colon status post laparoscopic colon resection. She had 3 ectopic pregnancies 1982, 199308-Apr-1985, each resulting in laparotomy.  Patient had tonsillectomy 1950s. In 03-06-93 she had a tubal repair laparotomy.  Bone density study 03-07-11 showed T score in the LS spine of -1.7 and T score of -2.1 in the femoral neck. She has not wanted to be on bisphosphonate medication or Prolia.  Social history: She is retired. 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 No history of smoking. Occasional wine consumption. She is married.  Family history: Father died in 03/06/2000 with history of coronary artery disease, CVA, MI, diabetes. Mother died at age 38 93 with history of hypertension and lung cancer. 3 brothers and 1 sister in good health. No children. Patient had a cousin who died of a sudden cardiac arrhythmia due to right ventricular dysplasia/cardiomyopathy. Patient went to [facility] and was screened for that condition and was found not to have it. However, she says several family members tested positive for the gene.  Her lipids are stable and essentially normal with exception of an LDL of 108.  CBC is normal as is c-Met and TSH. Urine dipstick is normal.      Review of Systems  Constitutional: Negative.   Respiratory: Negative.   Cardiovascular: Negative.   Gastrointestinal:  Negative.   Genitourinary: Negative.   Neurological: Negative.   Psychiatric/Behavioral: Negative.        Objective:   Physical Exam Vitals reviewed.  Constitutional:      General: She is not in acute distress.    Appearance: Normal appearance.  HENT:     Head: Normocephalic and atraumatic.     Right Ear: Tympanic membrane normal.     Left Ear: Tympanic membrane normal.     Nose: Nose normal.  Eyes:     General: No scleral icterus.       Right eye: No discharge.        Left eye: No discharge.     Extraocular Movements: Extraocular movements intact.     Conjunctiva/sclera: Conjunctivae normal.     Pupils: Pupils are equal, round, and reactive to light.  Neck:     Vascular: No carotid bruit.     Comments: No thyromegaly Cardiovascular:     Rate and Rhythm: Normal rate and regular rhythm.     Heart sounds: Normal heart sounds. No murmur heard.   Pulmonary:     Effort: Pulmonary effort is normal.     Breath sounds: No wheezing or rales.     Comments: Breast without masses Abdominal:     General: Bowel sounds are normal.     Palpations: Abdomen is soft. There is no mass.     Tenderness: There is no abdominal tenderness. There is no rebound.  Genitourinary:    Comments: Bimanual normal Musculoskeletal:     Cervical back: Neck supple. No  rigidity.  Lymphadenopathy:     Cervical: No cervical adenopathy.  Skin:    General: Skin is warm and dry.  Neurological:     General: No focal deficit present.     Mental Status: She is alert and oriented to person, place, and time.     Cranial Nerves: No cranial nerve deficit.     Sensory: No sensory deficit.     Motor: No weakness.     Coordination: Coordination normal.     Gait: Gait normal.  Psychiatric:        Mood and Affect: Mood normal.        Behavior: Behavior normal.        Thought Content: Thought content normal.        Judgment: Judgment normal.           Assessment & Plan:  Hypothyroidism-continue same dose  of thyroid replacement  Osteopenia-last had bone density study in 2019 and should be repeated  Insomnia-continue Ambien  Plan: Given prescription for Shingrix vaccine. Tetanus immunization is up-to-date. Flu vaccine given August 30. Has had 2 COVID-19 vaccines in January and February and will be due for booster soon. Has had 2 pneumococcal vaccines. Continue annual mammogram. Have bone density study.  Subjective:   Patient presents for Medicare Annual/Subsequent preventive examination.  Review Past Medical/Family/Social: See above   Risk Factors  Current exercise habits: Walks and works in yard Dietary issues discussed: Low-fat low carbohydrate  Cardiac risk factors: Hyperlipidemia and family history  Depression Screen  (Note: if answer to either of the following is "Yes", a more complete depression screening is indicated)   Over the past two weeks, have you felt down, depressed or hopeless? No  Over the past two weeks, have you felt little interest or pleasure in doing things? No Have you lost interest or pleasure in daily life? No Do you often feel hopeless? No Do you cry easily over simple problems? No   Activities of Daily Living  In your present state of health, do you have any difficulty performing the following activities?:   Driving? No  Managing money? No  Feeding yourself? No  Getting from bed to chair? No  Climbing a flight of stairs? No  Preparing food and eating?: No  Bathing or showering? No  Getting dressed: No  Getting to the toilet? No  Using the toilet:No  Moving around from place to place: No  In the past year have you fallen or had a near fall?:No  Are you sexually active? No  Do you have more than one partner? No   Hearing Difficulties: No  Do you often ask people to speak up or repeat themselves? No  Do you experience ringing or noises in your ears? No  Do you have difficulty understanding soft or whispered voices? No  Do you feel that you have  a problem with memory? No Do you often misplace items? No    Home Safety:  Do you have a smoke alarm at your residence? Yes Do you have grab bars in the bathroom? Yes Do you have throw rugs in your house? He has   Cognitive Testing  Alert? Yes Normal Appearance?Yes  Oriented to person? Yes Place? Yes  Time? Yes  Recall of three objects? Yes  Can perform simple calculations? Yes  Displays appropriate judgment?Yes  Can read the correct time from a watch face?Yes   List the Names of Other Physician/Practitioners you currently use:  See referral list for the  physicians patient is currently seeing.  No specialist currently   Review of Systems: See above   Objective:     General appearance: Appears stated age and mildly obese  Head: Normocephalic, without obvious abnormality, atraumatic  Eyes: conj clear, EOMi PEERLA  Ears: normal TM's and external ear canals both ears  Nose: Nares normal. Septum midline. Mucosa normal. No drainage or sinus tenderness.  Throat: lips, mucosa, and tongue normal; teeth and gums normal  Neck: no adenopathy, no carotid bruit, no JVD, supple, symmetrical, trachea midline and thyroid not enlarged, symmetric, no tenderness/mass/nodules  No CVA tenderness.  Lungs: clear to auscultation bilaterally  Breasts: normal appearance, no masses or tenderness Heart: regular rate and rhythm, S1, S2 normal, no murmur, click, rub or gallop  Abdomen: soft, non-tender; bowel sounds normal; no masses, no organomegaly  Musculoskeletal: ROM normal in all joints, no crepitus, no deformity, Normal muscle strengthen. Back  is symmetric, no curvature. Skin: Skin color, texture, turgor normal. No rashes or lesions  Lymph nodes: Cervical, supraclavicular, and axillary nodes normal.  Neurologic: CN 2 -12 Normal, Normal symmetric reflexes. Normal coordination and gait  Psych: Alert & Oriented x 3, Mood appear stable.    Assessment:    Annual wellness medicare exam    Plan:    During the course of the visit the patient was educated and counseled about appropriate screening and preventive services including:   Annual mammogram  Have bone density study  Have Covid update in the near future  Had flu vaccine     Patient Instructions (the written plan) was given to the patient.  Medicare Attestation  I have personally reviewed:  The patient's medical and social history  Their use of alcohol, tobacco or illicit drugs  Their current medications and supplements  The patient's functional ability including ADLs,fall risks, home safety risks, cognitive, and hearing and visual impairment  Diet and physical activities  Evidence for depression or mood disorders  The patient's weight, height, BMI, and visual acuity have been recorded in the chart. I have made referrals, counseling, and provided education to the patient based on review of the above and I have provided the patient with a written personalized care plan for preventive services.

## 2020-08-22 MED ORDER — ZOLPIDEM TARTRATE 5 MG PO TABS
5.0000 mg | ORAL_TABLET | Freq: Every evening | ORAL | 0 refills | Status: DC | PRN
Start: 2020-08-22 — End: 2023-07-11

## 2020-08-22 NOTE — Patient Instructions (Addendum)
It was a pleasure to see you today. Continue same thyroid replacement medication. Have bone density study. Take Ambien for insomnia as prescribed. Given prescription for Shingrix vaccine. Have COVID-19 booster when time is appropriate. Continue annual mammogram.

## 2020-09-08 ENCOUNTER — Other Ambulatory Visit: Payer: Self-pay | Admitting: Internal Medicine

## 2020-12-07 ENCOUNTER — Other Ambulatory Visit: Payer: Self-pay | Admitting: Internal Medicine

## 2021-03-07 ENCOUNTER — Other Ambulatory Visit: Payer: Self-pay | Admitting: Internal Medicine

## 2021-06-05 ENCOUNTER — Other Ambulatory Visit: Payer: Self-pay | Admitting: Internal Medicine

## 2021-07-28 ENCOUNTER — Other Ambulatory Visit: Payer: Self-pay

## 2021-07-28 ENCOUNTER — Other Ambulatory Visit: Payer: Medicare Other | Admitting: Internal Medicine

## 2021-07-28 DIAGNOSIS — E7800 Pure hypercholesterolemia, unspecified: Secondary | ICD-10-CM | POA: Diagnosis not present

## 2021-07-28 DIAGNOSIS — I6521 Occlusion and stenosis of right carotid artery: Secondary | ICD-10-CM

## 2021-07-28 DIAGNOSIS — G4700 Insomnia, unspecified: Secondary | ICD-10-CM

## 2021-07-28 DIAGNOSIS — M8580 Other specified disorders of bone density and structure, unspecified site: Secondary | ICD-10-CM

## 2021-07-28 DIAGNOSIS — E039 Hypothyroidism, unspecified: Secondary | ICD-10-CM | POA: Diagnosis not present

## 2021-07-28 DIAGNOSIS — Z9049 Acquired absence of other specified parts of digestive tract: Secondary | ICD-10-CM

## 2021-07-28 DIAGNOSIS — Z0000 Encounter for general adult medical examination without abnormal findings: Secondary | ICD-10-CM

## 2021-07-28 DIAGNOSIS — I10 Essential (primary) hypertension: Secondary | ICD-10-CM

## 2021-07-28 DIAGNOSIS — I6529 Occlusion and stenosis of unspecified carotid artery: Secondary | ICD-10-CM

## 2021-07-29 LAB — CBC WITH DIFFERENTIAL/PLATELET
Absolute Monocytes: 312 cells/uL (ref 200–950)
Basophils Absolute: 20 cells/uL (ref 0–200)
Basophils Relative: 0.5 %
Eosinophils Absolute: 80 cells/uL (ref 15–500)
Eosinophils Relative: 2 %
HCT: 42.6 % (ref 35.0–45.0)
Hemoglobin: 14.2 g/dL (ref 11.7–15.5)
Lymphs Abs: 1476 cells/uL (ref 850–3900)
MCH: 30.5 pg (ref 27.0–33.0)
MCHC: 33.3 g/dL (ref 32.0–36.0)
MCV: 91.4 fL (ref 80.0–100.0)
MPV: 11.9 fL (ref 7.5–12.5)
Monocytes Relative: 7.8 %
Neutro Abs: 2112 cells/uL (ref 1500–7800)
Neutrophils Relative %: 52.8 %
Platelets: 244 10*3/uL (ref 140–400)
RBC: 4.66 10*6/uL (ref 3.80–5.10)
RDW: 12.7 % (ref 11.0–15.0)
Total Lymphocyte: 36.9 %
WBC: 4 10*3/uL (ref 3.8–10.8)

## 2021-07-29 LAB — LIPID PANEL
Cholesterol: 243 mg/dL — ABNORMAL HIGH (ref <200)
HDL: 72 mg/dL (ref >=50)
LDL Cholesterol (Calc): 138 mg/dL (calc) — ABNORMAL HIGH
Non-HDL Cholesterol (Calc): 171 mg/dL (calc) — ABNORMAL HIGH (ref <130)
Total CHOL/HDL Ratio: 3.4 (calc) (ref <5.0)
Triglycerides: 191 mg/dL — ABNORMAL HIGH (ref <150)

## 2021-07-29 LAB — COMPLETE METABOLIC PANEL WITH GFR
AG Ratio: 1.7 (calc) (ref 1.0–2.5)
ALT: 16 U/L (ref 6–29)
AST: 17 U/L (ref 10–35)
Albumin: 4.6 g/dL (ref 3.6–5.1)
Alkaline phosphatase (APISO): 56 U/L (ref 37–153)
BUN: 13 mg/dL (ref 7–25)
CO2: 27 mmol/L (ref 20–32)
Calcium: 9.8 mg/dL (ref 8.6–10.4)
Chloride: 104 mmol/L (ref 98–110)
Creat: 0.76 mg/dL (ref 0.60–1.00)
Globulin: 2.7 g/dL (calc) (ref 1.9–3.7)
Glucose, Bld: 93 mg/dL (ref 65–99)
Potassium: 4.4 mmol/L (ref 3.5–5.3)
Sodium: 139 mmol/L (ref 135–146)
Total Bilirubin: 0.5 mg/dL (ref 0.2–1.2)
Total Protein: 7.3 g/dL (ref 6.1–8.1)
eGFR: 83 mL/min/{1.73_m2} (ref >=60)

## 2021-07-29 LAB — TSH: TSH: 1.87 mIU/L (ref 0.40–4.50)

## 2021-07-30 ENCOUNTER — Encounter: Payer: Self-pay | Admitting: Internal Medicine

## 2021-07-30 ENCOUNTER — Other Ambulatory Visit: Payer: Self-pay

## 2021-07-30 ENCOUNTER — Ambulatory Visit (INDEPENDENT_AMBULATORY_CARE_PROVIDER_SITE_OTHER): Payer: Medicare Other | Admitting: Internal Medicine

## 2021-07-30 VITALS — BP 148/90 | HR 60 | Ht 61.0 in | Wt 147.0 lb

## 2021-07-30 DIAGNOSIS — E7800 Pure hypercholesterolemia, unspecified: Secondary | ICD-10-CM | POA: Diagnosis not present

## 2021-07-30 DIAGNOSIS — Z0000 Encounter for general adult medical examination without abnormal findings: Secondary | ICD-10-CM

## 2021-07-30 DIAGNOSIS — Z789 Other specified health status: Secondary | ICD-10-CM

## 2021-07-30 DIAGNOSIS — E039 Hypothyroidism, unspecified: Secondary | ICD-10-CM

## 2021-07-30 DIAGNOSIS — Z9049 Acquired absence of other specified parts of digestive tract: Secondary | ICD-10-CM

## 2021-07-30 NOTE — Progress Notes (Addendum)
Subjective:    Patient ID: Pamela [name], female    DOB: July 19, 1946, 75 y.o.   MRN: [id number]  HPI 75 year old Female for Medicare wellness, health maintenance exam and evaluation of medical issues.  She has a history of osteopenia, hypothyroidism, hyperlipidemia treated with statin medication.  Had bone density study Jun 09, 2019with lowest T score -1.9.  Longstanding history of insomnia treated with Ambien.  History of left wrist fracture 1997 had to be repaired surgically.  History of fibrocystic breast disease, diverticulosis of the colon status post post laparoscopic colon resection.  She had 3 ectopic pregnancies 1992, 1993, 1994/05/07 each resulting in laparotomy.  Patient had tonsillectomy 1950s.  In May 07, 1993, she had a tubal repair laparotomy.  Bone density study May 07, 2018 showed lowest T score -1.9 and right femoral neck and has not wanted to be on bisphosphonate medication or Prolia  Last mammogram on file was April 2021 and was normal  Social history: She is retired.  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  No history of smoking.  Occasional wine consumption.  She is married.  She has a history of pure hypercholesterolemia.  Last year lipid panel was near normal with exception of an LDL of 108.  Recently total cholesterol was 243, triglycerides 191 and LDL cholesterol 138.  She will now increase Zocor from 10 to 20 mg daily.  She is taking thyroid replacement medication levothyroxine 0.088 mg daily and TSH is normal.  CBC and c-Met are normal.  Had colonoscopy in 2012/05/07 and repeat is due next year.  Records show 4 COVID vaccines.  Recommend booster.  Family history: Father died in 05/07/00 with history of coronary artery disease, CVA, MI, diabetes.  Mother died at age 65 in 45 with history of hypertension and lung cancer.  3 brothers and 1 sister in good health.  No children.  Patient had a cousin who died of a sudden cardiac arrhythmia due to right ventricular  dysplasia/cardiomyopathy.  Patient went to [facility], was screened for that condition and was found not to have it.  However she says several family members have tested positive for the gene.    Review of Systems No new complaints    Objective:   Physical Exam    BP 148/90, weight 147 pounds-has gained 5 pounds since last year    BMI 27.78  Skin: Warm and dry.  No thyromegaly.  TMs clear.  No carotid bruits.  Chest is clear.  Breasts are without masses.  Cardiac exam: Regular rate and rhythm.  Abdomen is soft nondistended without hepatosplenomegaly masses or tenderness.  No lower extremity pitting edema.  Neuro is intact without focal deficits.  Affect thought and judgment are normal.  Bimanual is normal.       Assessment & Plan:   Mixed hyperlipidemia-significant increase in total cholesterol, LDL cholesterol and triglycerides.  She will now take Zocor 20 mg daily. She will follow up in late Feb  2023 for labs and early March 2023 for appt.  History of hypothyroidism-TSH stable on levothyroxine 0.088 mg daily.  Follow-up in 1 year.  Colonoscopy due next year  Health maintenance-recommend COVID booster this Fall  Plan:   Recommend COVID booster this fall.  Colonoscopy due in May 07, 2022. Increasing Zocor to 20 mg daily. Follow up on lipids Early March with labs late Feb.   Subjective:   Patient presents for Medicare Annual/Subsequent preventive examination.  Review Past Medical/Family/Social: See above  Risk Factors  Current exercise habits: Does yard work and gets exercise regularly Dietary issues discussed: Yes  Cardiac risk factors: Hyperlipidemia  Depression Screen  (Note: if answer to either of the following is "Yes", a more complete depression screening is indicated)   Over the past two weeks, have you felt down, depressed or hopeless? No  Over the past two weeks, have you felt little interest or pleasure in doing things? No Have you lost interest or  pleasure in daily life? No Do you often feel hopeless? No Do you cry easily over simple problems? No   Activities of Daily Living  In your present state of health, do you have any difficulty performing the following activities?:   Driving? No  Managing money? No  Feeding yourself? No  Getting from bed to chair? No  Climbing a flight of stairs? No  Preparing food and eating?: No  Bathing or showering? No  Getting dressed: No  Getting to the toilet? No  Using the toilet:No  Moving around from place to place: No  In the past year have you fallen or had a near fall?:No  Are you sexually active? No  Do you have more than one partner? No   Hearing Difficulties: No  Do you often ask people to speak up or repeat themselves? No  Do you experience ringing or noises in your ears? No  Do you have difficulty understanding soft or whispered voices? No  Do you feel that you have a problem with memory? No Do you often misplace items? No    Home Safety:  Do you have a smoke alarm at your residence? Yes Do you have grab bars in the bathroom?  Yes Do you have throw rugs in your house?  Yes   Cognitive Testing  Alert? Yes Normal Appearance?Yes  Oriented to person? Yes Place? Yes  Time? Yes  Recall of three objects? Yes  Can perform simple calculations? Yes  Displays appropriate judgment?Yes  Can read the correct time from a watch face?Yes   List the Names of Other Physician/Practitioners you currently use:  See referral list for the physicians patient is currently seeing.     Review of Systems: See above   Objective:     General appearance: Appears younger than stated age Head: Normocephalic, without obvious abnormality, atraumatic  Eyes: conj clear, EOMi PEERLA  Ears: normal TM's and external ear canals both ears  Nose: Nares normal. Septum midline. Mucosa normal. No drainage or sinus tenderness.  Throat: lips, mucosa, and tongue normal; teeth and gums normal  Neck: no  adenopathy, no carotid bruit, no JVD, supple, symmetrical, trachea midline and thyroid not enlarged, symmetric, no tenderness/mass/nodules  No CVA tenderness.  Lungs: clear to auscultation bilaterally  Breasts: normal appearance, no masses or tenderness Heart: regular rate and rhythm, S1, S2 normal, no murmur, click, rub or gallop  Abdomen: soft, non-tender; bowel sounds normal; no masses, no organomegaly  Musculoskeletal: ROM normal in all joints, no crepitus, no deformity, Normal muscle strengthen. Back  is symmetric, no curvature. Skin: Skin color, texture, turgor normal. No rashes or lesions  Lymph nodes: Cervical, supraclavicular, and axillary nodes normal.  Neurologic: CN 2 -12 Normal, Normal symmetric reflexes. Normal coordination and gait  Psych: Alert & Oriented x 3, Mood appear stable.    Assessment:    Annual wellness medicare exam   Plan:    During the course of the visit the patient was educated and counseled about appropriate screening and preventive services  including:   See above     Patient Instructions (the written plan) was given to the patient.  Medicare Attestation  I have personally reviewed:  The patient's medical and social history  Their use of alcohol, tobacco or illicit drugs  Their current medications and supplements  The patient's functional ability including ADLs,fall risks, home safety risks, cognitive, and hearing and visual impairment  Diet and physical activities  Evidence for depression or mood disorders  The patient's weight, height, BMI, and visual acuity have been recorded in the chart. I have made referrals, counseling, and provided education to the patient based on review of the above and I have provided the patient with a written personalized care plan for preventive services.

## 2021-08-26 NOTE — Patient Instructions (Addendum)
Mammogram ordered placed.  Watch diet.  We understand you do not prefer to take statin at this time.  Continue same dose of thyroid replacement.  Recommend COVID booster in the fall.  Continue exercising.  Recheck lipids in 6 months.  No need to check liver functions if you are not taking statin medication.  Monitor blood pressure.

## 2021-08-27 ENCOUNTER — Other Ambulatory Visit: Payer: Self-pay

## 2021-08-27 ENCOUNTER — Telehealth: Payer: Self-pay | Admitting: Internal Medicine

## 2021-08-27 MED ORDER — SIMVASTATIN 20 MG PO TABS: 20.0000 mg | ORAL_TABLET | Freq: Every day | ORAL | 3 refills | Status: DC

## 2021-08-27 MED ORDER — SIMVASTATIN 10 MG PO TABS: ORAL_TABLET | ORAL | 3 refills | Status: DC

## 2021-08-27 NOTE — Telephone Encounter (Signed)
Correcting dosage and sending new Rx to pharmacy. Will addend/correct your last CPE note. Sorry. It's been very difficult here with my CMA leaving to pursue another career. Thank you for clarifying this, MJB

## 2021-08-27 NOTE — Telephone Encounter (Signed)
Pamela [name] [id number]  [name] called to say she was concerned because her after visit summary said several times that she was not taking her simvastatin and she is and has been for years. She also stated that at her visit on 07/30/2021 you guys decided that she would increase it to 20 mg, which she has done, and she now needs a new prescription for that. Can you get it change in her chart to show she is in compliance with taking this medication and send in refill?  simvastatin (ZOCOR) 20 MG tablet  [facility] #[id number] - [place], [place] - [address] AT [facility] [address] Phone:  [phone]  Fax:  [phone]

## 2021-08-27 NOTE — Telephone Encounter (Signed)
Refill Zocor sent to [facility] as requested for 90 days with 2 refills

## 2021-09-03 ENCOUNTER — Other Ambulatory Visit: Payer: Self-pay | Admitting: Internal Medicine

## 2021-09-09 ENCOUNTER — Telehealth: Payer: Self-pay

## 2021-09-09 NOTE — Telephone Encounter (Signed)
-----   Message from [name], MD sent at 08/28/2021  9:45 AM EDT ----- Please extend this order

## 2021-09-09 NOTE — Telephone Encounter (Signed)
LMOM for patient to see if she had scheduled her mammo or was going ot complete it soon.

## 2021-10-30 ENCOUNTER — Other Ambulatory Visit: Payer: Self-pay

## 2021-10-30 ENCOUNTER — Ambulatory Visit
Admission: RE | Admit: 2021-10-30 | Discharge: 2021-10-30 | Disposition: A | Discharge status: Home / Self Care | Payer: Medicare Other | Source: Ambulatory Visit | Attending: Internal Medicine | Admitting: Internal Medicine

## 2021-10-30 DIAGNOSIS — Z1231 Encounter for screening mammogram for malignant neoplasm of breast: Secondary | ICD-10-CM | POA: Diagnosis not present

## 2021-12-23 ENCOUNTER — Encounter: Payer: Self-pay | Admitting: Podiatry

## 2021-12-23 ENCOUNTER — Other Ambulatory Visit: Payer: Self-pay

## 2021-12-23 ENCOUNTER — Ambulatory Visit (INDEPENDENT_AMBULATORY_CARE_PROVIDER_SITE_OTHER): Payer: Medicare Other

## 2021-12-23 ENCOUNTER — Ambulatory Visit: Payer: Medicare Other | Admitting: Podiatry

## 2021-12-23 DIAGNOSIS — M722 Plantar fascial fibromatosis: Secondary | ICD-10-CM | POA: Diagnosis not present

## 2021-12-23 NOTE — Progress Notes (Signed)
Subjective:   Patient ID: Pamela [name], female   DOB: 76 y.o.   MRN: [id number]   HPI Patient presents stating she is done very well with orthotics in the past and needs a new pair and also has developed quite a bit of pain in the right arch stating its been very sore over the last several months.  Patient does not smoke likes to be active   Review of Systems  All other systems reviewed and are negative.      Objective:  Physical Exam Vitals and nursing note reviewed.  Constitutional:      Appearance: She is well-developed.  Pulmonary:     Effort: Pulmonary effort is normal.  Musculoskeletal:        General: Normal range of motion.  Skin:    General: Skin is warm.  Neurological:     Mental Status: She is alert.    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  I did sterile prep injected the fascial right 3 mg Kenalog 5 mg Xylocaine discussed we may need to do the left but I want to try it and see how the right 1 feels first  X-rays look good no signs of bone pathology or other issues

## 2021-12-31 IMAGING — MG DIGITAL SCREENING BILAT W/ TOMO W/ CAD
8 series · 8 of 24 positions shown · non-contrast
Comparison: Previous exam(s).

CLINICAL DATA: Screening.

EXAM:
DIGITAL SCREENING BILATERAL MAMMOGRAM WITH TOMO AND CAD

[R CC synth-2D]
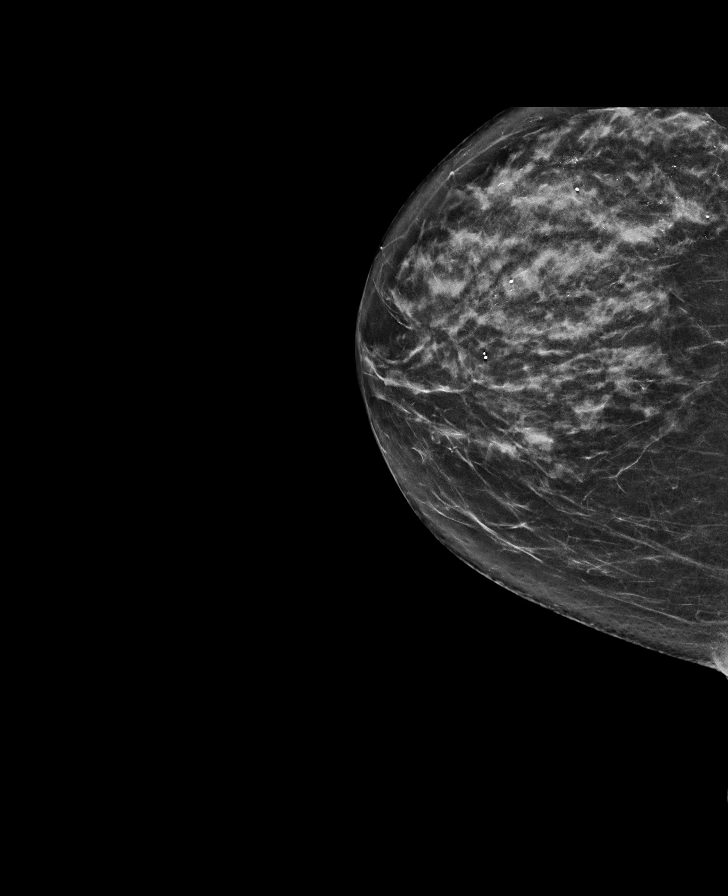

[R MLO synth-2D]
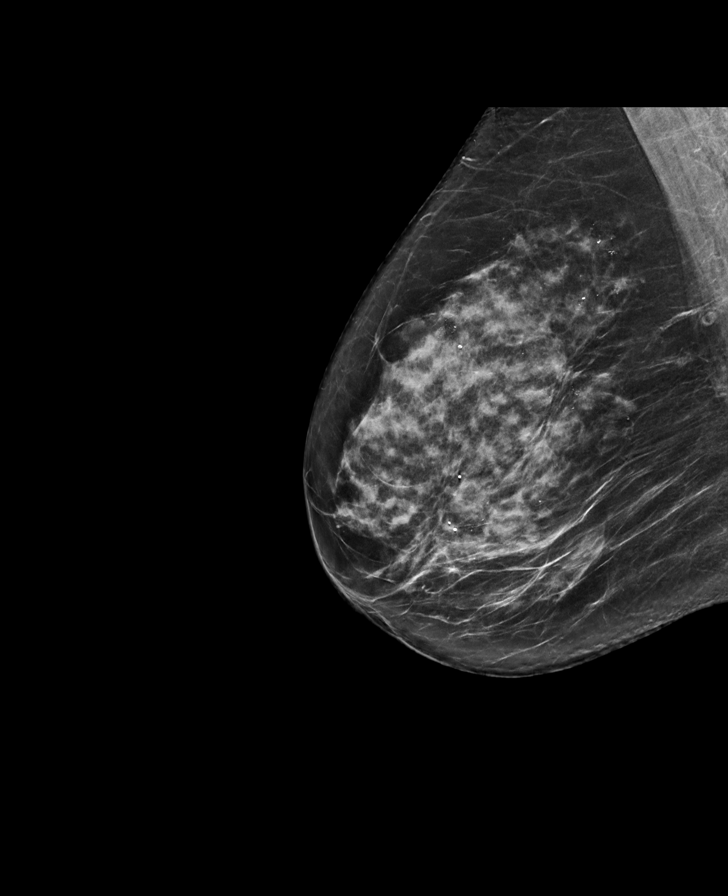

[L CC synth-2D]
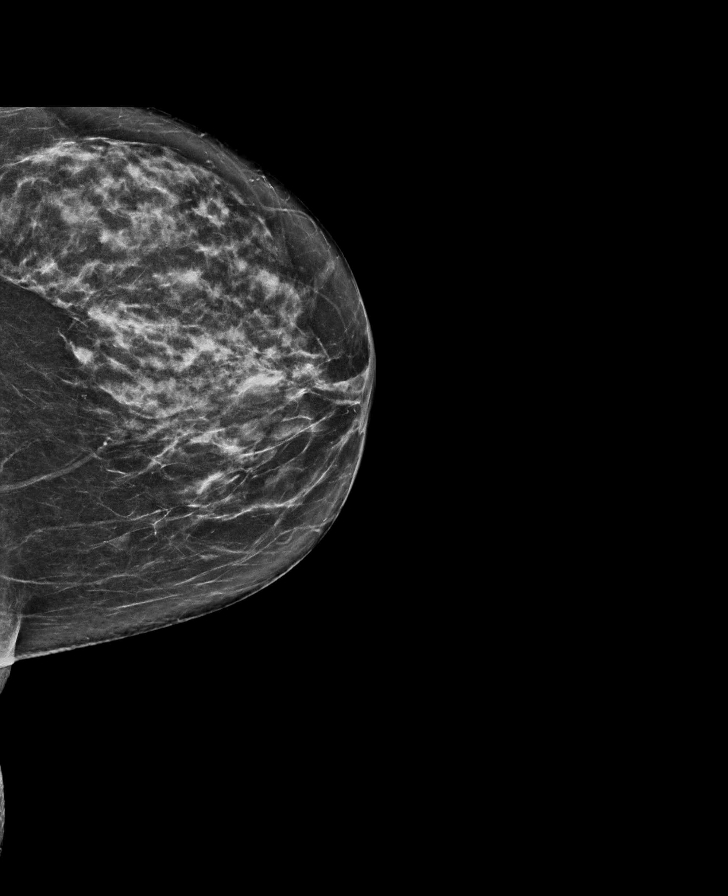

[L MLO synth-2D]
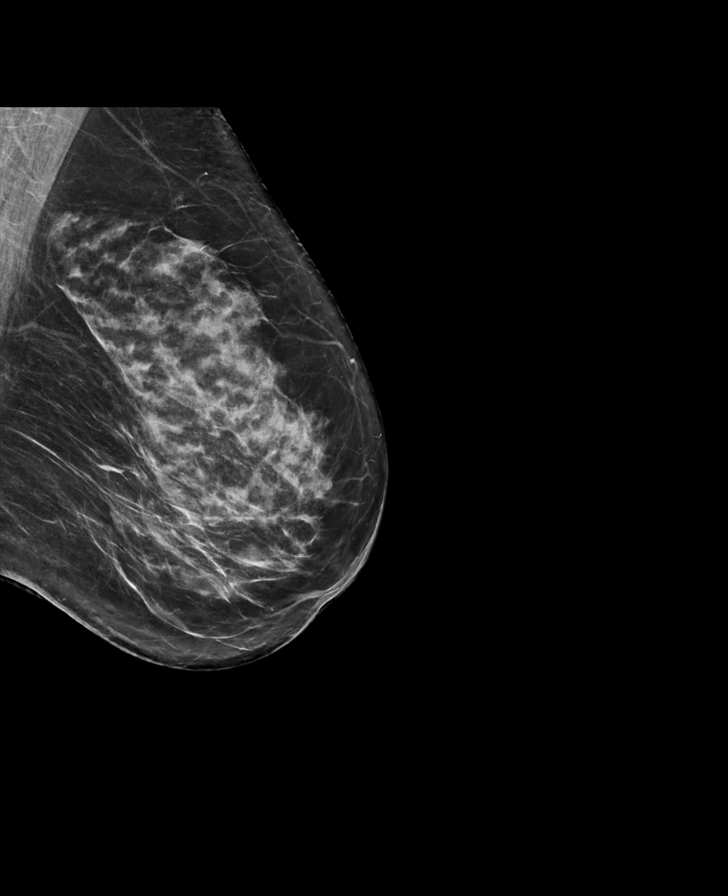

[L MLO tomo · tomo slice 39/76.0]
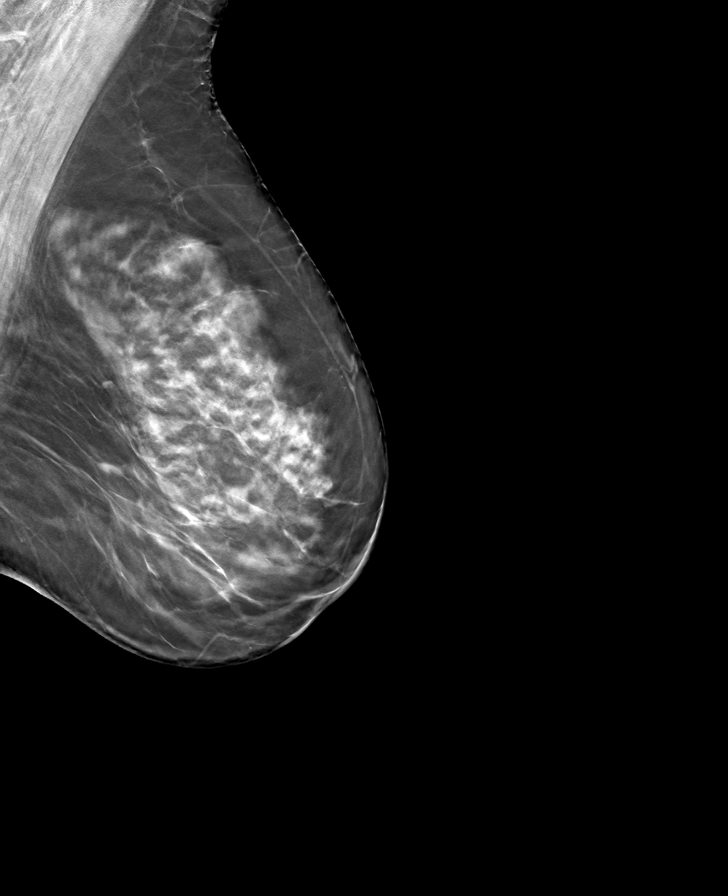

[R MLO tomo · tomo slice 37/74.0]
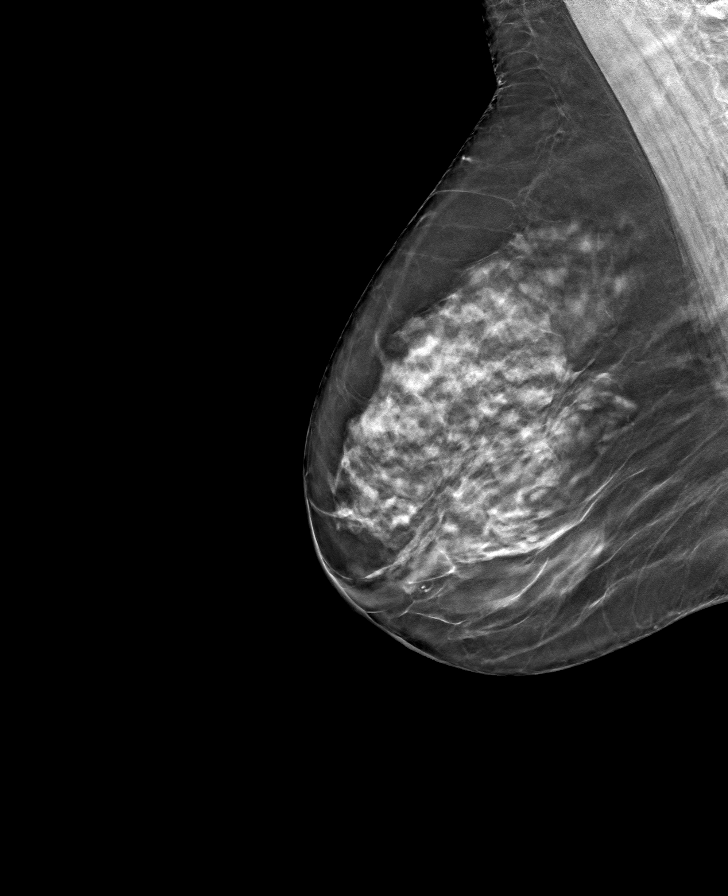

[L CC tomo · tomo slice 34/67.0]
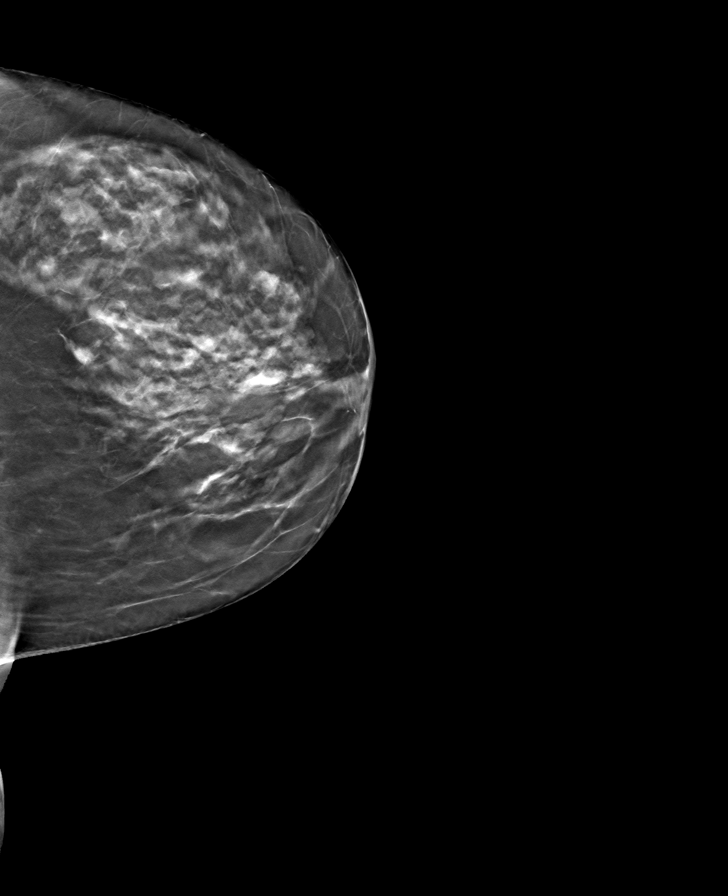

[R CC tomo · tomo slice 31/62.0]
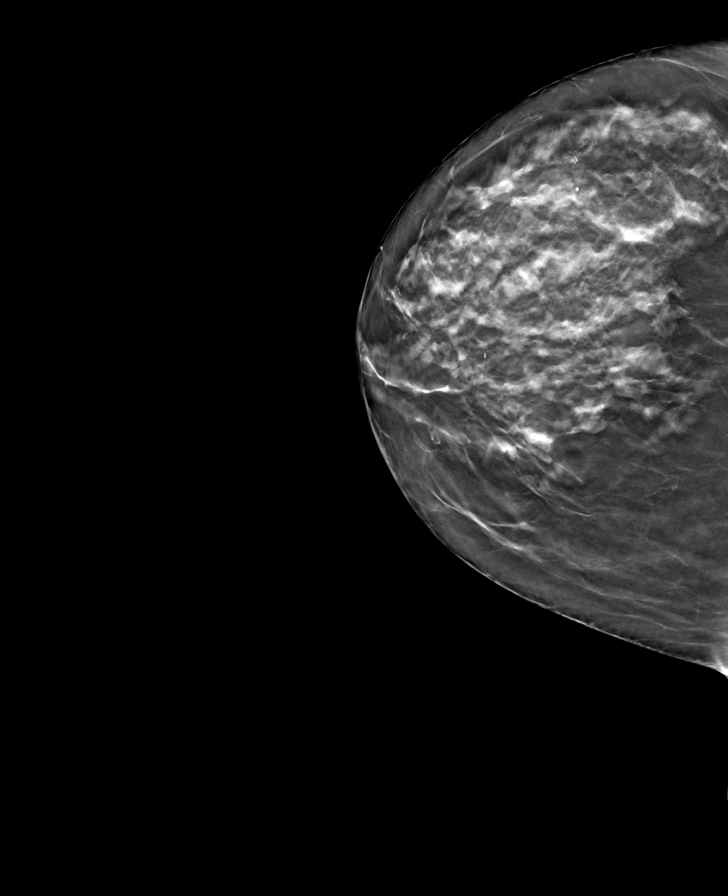

[8 of 24 positions shown; findings below may reference images not displayed]

ACR Breast Density Category c: The breast tissue is heterogeneously
dense, which may obscure small masses.
FINDINGS: There are no findings suspicious for malignancy. Images were
processed with CAD.
IMPRESSION: No mammographic evidence of malignancy. A result letter of this
screening mammogram will be mailed directly to the patient.

RECOMMENDATION:
Screening mammogram in one year. (Code:FT-U-LHB)

BI-RADS CATEGORY  1: Negative.

## 2022-01-26 ENCOUNTER — Ambulatory Visit: Payer: Medicare Other

## 2022-01-26 ENCOUNTER — Other Ambulatory Visit: Payer: Self-pay

## 2022-01-26 ENCOUNTER — Other Ambulatory Visit: Payer: Medicare Other | Admitting: Internal Medicine

## 2022-01-26 DIAGNOSIS — E7800 Pure hypercholesterolemia, unspecified: Secondary | ICD-10-CM | POA: Diagnosis not present

## 2022-01-26 DIAGNOSIS — M722 Plantar fascial fibromatosis: Secondary | ICD-10-CM

## 2022-01-26 NOTE — Progress Notes (Signed)
SITUATION: Reason for Visit: Fitting and Delivery of Custom Fabricated Foot Orthoses Patient Report: Patient reports comfort and is satisfied with device.  OBJECTIVE DATA: Patient History / Diagnosis:     ICD-10-CM   1. Plantar fasciitis, bilateral  M72.2       Provided Device:  Custom Functional Foot Orthotics     [employer] [phone]  GOAL OF ORTHOSIS - Improve gait - Decrease energy expenditure - Improve Balance - Provide Triplanar stability of foot complex - Facilitate motion  ACTIONS PERFORMED Patient was fit with foot orthotics trimmed to shoe last. Patient tolerated fittign procedure.   Patient was provided with verbal and written instruction and demonstration regarding donning, doffing, wear, care, proper fit, function, purpose, cleaning, and use of the orthosis and in all related precautions and risks and benefits regarding the orthosis.  Patient was also provided with verbal instruction regarding how to report any failures or malfunctions of the orthosis and necessary follow up care. Patient was also instructed to contact our office regarding any change in status that may affect the function of the orthosis.  Patient demonstrated independence with proper donning, doffing, and fit and verbalized understanding of all instructions.  PLAN: Patient is to follow up in one week or as necessary (PRN). All questions were answered and concerns addressed. Plan of care was discussed with and agreed upon by the patient.

## 2022-01-27 DIAGNOSIS — H5213 Myopia, bilateral: Secondary | ICD-10-CM | POA: Diagnosis not present

## 2022-01-27 DIAGNOSIS — H52223 Regular astigmatism, bilateral: Secondary | ICD-10-CM | POA: Diagnosis not present

## 2022-01-27 DIAGNOSIS — H524 Presbyopia: Secondary | ICD-10-CM | POA: Diagnosis not present

## 2022-01-27 LAB — HEPATIC FUNCTION PANEL
AG Ratio: 2 (calc) (ref 1.0–2.5)
ALT: 14 U/L (ref 6–29)
AST: 16 U/L (ref 10–35)
Albumin: 4.5 g/dL (ref 3.6–5.1)
Alkaline phosphatase (APISO): 48 U/L (ref 37–153)
Bilirubin, Direct: 0.1 mg/dL (ref 0.0–0.2)
Globulin: 2.3 g/dL (calc) (ref 1.9–3.7)
Indirect Bilirubin: 0.5 mg/dL (calc) (ref 0.2–1.2)
Total Bilirubin: 0.6 mg/dL (ref 0.2–1.2)
Total Protein: 6.8 g/dL (ref 6.1–8.1)

## 2022-01-27 LAB — LIPID PANEL
Cholesterol: 187 mg/dL (ref <200)
HDL: 77 mg/dL (ref >=50)
LDL Cholesterol (Calc): 89 mg/dL (calc)
Non-HDL Cholesterol (Calc): 110 mg/dL (calc) (ref <130)
Total CHOL/HDL Ratio: 2.4 (calc) (ref <5.0)
Triglycerides: 114 mg/dL (ref <150)

## 2022-01-29 ENCOUNTER — Other Ambulatory Visit: Payer: Self-pay

## 2022-01-29 ENCOUNTER — Ambulatory Visit (INDEPENDENT_AMBULATORY_CARE_PROVIDER_SITE_OTHER): Payer: Medicare Other | Admitting: Internal Medicine

## 2022-01-29 ENCOUNTER — Encounter: Payer: Self-pay | Admitting: Internal Medicine

## 2022-01-29 VITALS — BP 134/74 | HR 65 | Temp 98.3°F | Ht 61.0 in | Wt 144.2 lb

## 2022-01-29 DIAGNOSIS — E7800 Pure hypercholesterolemia, unspecified: Secondary | ICD-10-CM | POA: Diagnosis not present

## 2022-01-29 DIAGNOSIS — E039 Hypothyroidism, unspecified: Secondary | ICD-10-CM

## 2022-01-29 MED ORDER — AZITHROMYCIN 250 MG PO TABS: ORAL_TABLET | ORAL | 0 refills | Status: AC

## 2022-01-29 NOTE — Progress Notes (Signed)
° °  Subjective:    Patient ID: Pamela [name], female    DOB: 10-24-1946, 76 y.o.   MRN: [id number]  HPI 76 year old Female here for 17-month recheck.  She has a history of hypothyroidism, insomnia, hyperlipidemia.  She is basically here for follow-up on hyperlipidemia having started statin 6 months ago.  Longstanding history of insomnia treated with Ambien 5 mg if needed.  History of fibrocystic breast disease, diverticulosis of the colon status post laparoscopic colon resection.  Bone density study in 2019 showed lowest T score to be -1.9 in the right femoral neck and has not wanted to be on bisphosphonate medication or Prolia.  At last visit, total cholesterol was 243, triglycerides 191 and LDL cholesterol 138.  She started taking Zocor 20 mg daily and lipids have normalized.  Liver functions are normal.  This is an excellent response to statin drug.  TSH was not checked today.  It was checked in August 2022 and was normal on thyroid replacement medication.  She uses Cosopt ophthalmic drops for history of glaucoma.      Review of Systems see above-feels well     Objective:   Physical Exam Blood pressure 134/74, pulse 65 regular, Pulse oximetry 98% temperature 98.3 degrees.  Weight 144 pounds 4 ounces height 5 feet 1 inches  Cardiac exam: Regular rate and rhythm.  No lower extremity edema.  No thyromegaly.       Assessment & Plan:   Hypothyroidism-stable on current dose of thyroid replacement medication.  TSH not checked with this visit  Insomnia-treated with low-dose Ambien.  Hyperlipidemia-treated with Zocor 20 mg daily.  Lipid panel is completely normal compared to 6 months ago.  This is an excellent response.  Plan: Return in 6 months for health maintenance exam and Medicare wellness visit with fasting labs.  Continue current medications including statin.  I am pleased with her response to statin medication.  Advised taking Z-pak with her for upcoming travel.

## 2022-02-01 ENCOUNTER — Telehealth: Payer: Self-pay

## 2022-02-01 NOTE — Telephone Encounter (Signed)
Foot Orthotics sent to [facility] for Refurbishment ?

## 2022-02-12 ENCOUNTER — Telehealth: Payer: Self-pay

## 2022-02-12 NOTE — Telephone Encounter (Signed)
Called patient left vm to advise orthotics are available to pickup  ?

## 2022-02-16 ENCOUNTER — Other Ambulatory Visit: Payer: Self-pay

## 2022-02-16 ENCOUNTER — Ambulatory Visit: Payer: Medicare Other

## 2022-02-16 DIAGNOSIS — M722 Plantar fascial fibromatosis: Secondary | ICD-10-CM

## 2022-02-16 NOTE — Progress Notes (Signed)
SITUATION: ?Reason for Visit: Fitting and Delivery of Custom Fabricated Foot Orthoses ?Patient Report: Patient reports comfort and is satisfied with device. ? ?OBJECTIVE DATA: ?Patient History / Diagnosis:   ?  ICD-10-CM   ?1. Plantar fasciitis, bilateral  M72.2   ?  ? ? ?Provided Device:  Custom Functional Foot Orthotics - Refurbished ?    [facility]: [id number] ? ?GOAL OF ORTHOSIS ?- Improve gait ?- Decrease energy expenditure ?- Improve Balance ?- Provide Triplanar stability of foot complex ?- Facilitate motion ? ?ACTIONS PERFORMED ?Patient was fit with foot orthotics trimmed to shoe last. Patient tolerated fittign procedure.  ? ?Patient was provided with verbal and written instruction and demonstration regarding donning, doffing, wear, care, proper fit, function, purpose, cleaning, and use of the orthosis and in all related precautions and risks and benefits regarding the orthosis. ? ?Patient was also provided with verbal instruction regarding how to report any failures or malfunctions of the orthosis and necessary follow up care. Patient was also instructed to contact our office regarding any change in status that may affect the function of the orthosis. ? ?Patient demonstrated independence with proper donning, doffing, and fit and verbalized understanding of all instructions. ? ?PLAN: ?Patient is to follow up in one week or as necessary (PRN). All questions were answered and concerns addressed. Plan of care was discussed with and agreed upon by the patient. ? ?

## 2022-02-17 DIAGNOSIS — H401132 Primary open-angle glaucoma, bilateral, moderate stage: Secondary | ICD-10-CM | POA: Diagnosis not present

## 2022-02-18 ENCOUNTER — Other Ambulatory Visit: Payer: Self-pay | Admitting: Internal Medicine

## 2022-02-22 NOTE — Patient Instructions (Signed)
I am very pleased with her response to statin medication.  Please continue to take this medication as well as her other medications.  Follow-up in 6 months for Medicare wellness and health maintenance exam.  It was a pleasure to see you today. ?

## 2022-03-01 DIAGNOSIS — M79673 Pain in unspecified foot: Secondary | ICD-10-CM

## 2022-05-27 ENCOUNTER — Encounter: Payer: Self-pay | Admitting: Internal Medicine

## 2022-06-23 DIAGNOSIS — H524 Presbyopia: Secondary | ICD-10-CM | POA: Diagnosis not present

## 2022-08-17 ENCOUNTER — Other Ambulatory Visit: Payer: Self-pay | Admitting: Internal Medicine

## 2022-08-24 ENCOUNTER — Other Ambulatory Visit: Payer: Medicare Other

## 2022-08-24 DIAGNOSIS — E039 Hypothyroidism, unspecified: Secondary | ICD-10-CM | POA: Diagnosis not present

## 2022-08-24 DIAGNOSIS — I10 Essential (primary) hypertension: Secondary | ICD-10-CM

## 2022-08-24 DIAGNOSIS — E785 Hyperlipidemia, unspecified: Secondary | ICD-10-CM

## 2022-08-25 ENCOUNTER — Other Ambulatory Visit: Payer: Self-pay | Admitting: Internal Medicine

## 2022-08-25 LAB — COMPLETE METABOLIC PANEL WITH GFR
AG Ratio: 1.9 (calc) (ref 1.0–2.5)
ALT: 13 U/L (ref 6–29)
AST: 15 U/L (ref 10–35)
Albumin: 4.5 g/dL (ref 3.6–5.1)
Alkaline phosphatase (APISO): 47 U/L (ref 37–153)
BUN: 16 mg/dL (ref 7–25)
CO2: 26 mmol/L (ref 20–32)
Calcium: 9.6 mg/dL (ref 8.6–10.4)
Chloride: 106 mmol/L (ref 98–110)
Creat: 0.78 mg/dL (ref 0.60–1.00)
Globulin: 2.4 g/dL (calc) (ref 1.9–3.7)
Glucose, Bld: 90 mg/dL (ref 65–99)
Potassium: 4.8 mmol/L (ref 3.5–5.3)
Sodium: 140 mmol/L (ref 135–146)
Total Bilirubin: 0.5 mg/dL (ref 0.2–1.2)
Total Protein: 6.9 g/dL (ref 6.1–8.1)
eGFR: 80 mL/min/{1.73_m2} (ref >=60)

## 2022-08-25 LAB — CBC WITH DIFFERENTIAL/PLATELET
Absolute Monocytes: 357 cells/uL (ref 200–950)
Basophils Absolute: 31 cells/uL (ref 0–200)
Basophils Relative: 0.6 %
Eosinophils Absolute: 71 cells/uL (ref 15–500)
Eosinophils Relative: 1.4 %
HCT: 42.6 % (ref 35.0–45.0)
Hemoglobin: 13.8 g/dL (ref 11.7–15.5)
Lymphs Abs: 1862 cells/uL (ref 850–3900)
MCH: 30.1 pg (ref 27.0–33.0)
MCHC: 32.4 g/dL (ref 32.0–36.0)
MCV: 93 fL (ref 80.0–100.0)
MPV: 11.9 fL (ref 7.5–12.5)
Monocytes Relative: 7 %
Neutro Abs: 2780 cells/uL (ref 1500–7800)
Neutrophils Relative %: 54.5 %
Platelets: 219 10*3/uL (ref 140–400)
RBC: 4.58 10*6/uL (ref 3.80–5.10)
RDW: 12.9 % (ref 11.0–15.0)
Total Lymphocyte: 36.5 %
WBC: 5.1 10*3/uL (ref 3.8–10.8)

## 2022-08-25 LAB — LIPID PANEL
Cholesterol: 204 mg/dL — ABNORMAL HIGH (ref <200)
HDL: 68 mg/dL (ref >=50)
LDL Cholesterol (Calc): 108 mg/dL (calc) — ABNORMAL HIGH
Non-HDL Cholesterol (Calc): 136 mg/dL (calc) — ABNORMAL HIGH (ref <130)
Total CHOL/HDL Ratio: 3 (calc) (ref <5.0)
Triglycerides: 168 mg/dL — ABNORMAL HIGH (ref <150)

## 2022-08-25 LAB — TSH: TSH: 0.48 mIU/L (ref 0.40–4.50)

## 2022-08-27 ENCOUNTER — Encounter: Payer: Self-pay | Admitting: Internal Medicine

## 2022-08-27 ENCOUNTER — Ambulatory Visit (INDEPENDENT_AMBULATORY_CARE_PROVIDER_SITE_OTHER): Payer: Medicare Other | Admitting: Internal Medicine

## 2022-08-27 VITALS — BP 130/80 | HR 60 | Temp 98.0°F | Ht 61.0 in | Wt 135.4 lb

## 2022-08-27 DIAGNOSIS — M8580 Other specified disorders of bone density and structure, unspecified site: Secondary | ICD-10-CM | POA: Diagnosis not present

## 2022-08-27 DIAGNOSIS — Z780 Asymptomatic menopausal state: Secondary | ICD-10-CM

## 2022-08-27 DIAGNOSIS — E7800 Pure hypercholesterolemia, unspecified: Secondary | ICD-10-CM

## 2022-08-27 DIAGNOSIS — Z9049 Acquired absence of other specified parts of digestive tract: Secondary | ICD-10-CM

## 2022-08-27 DIAGNOSIS — E039 Hypothyroidism, unspecified: Secondary | ICD-10-CM | POA: Diagnosis not present

## 2022-08-27 DIAGNOSIS — Z0000 Encounter for general adult medical examination without abnormal findings: Secondary | ICD-10-CM | POA: Diagnosis not present

## 2022-08-27 LAB — POCT URINALYSIS DIPSTICK
Bilirubin, UA: NEGATIVE
Glucose, UA: NEGATIVE
Ketones, UA: NEGATIVE
Leukocytes, UA: NEGATIVE
Nitrite, UA: NEGATIVE
Protein, UA: NEGATIVE
Spec Grav, UA: 1.01 (ref 1.010–1.025)
Urobilinogen, UA: 0.2 E.U./dL
pH, UA: 6.5 (ref 5.0–8.0)

## 2022-08-27 NOTE — Progress Notes (Signed)
   Subjective:    Patient ID: Pamela [name], female    DOB: 14-Mar-1946, 76 y.o.   MRN: [id number]  HPI 76 year old Female seen for Medicare wellness and evaluation of medical issues. For about a month,having issues with left knee. Started going to water aerobics and felt she had hyperextended her knee. Feels a little unsteady going up steps.  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   Has keratosis right scalp.  Will be seeing dermatologist soon.  History of osteopenia, hypothyroidism, hyperlipidemia treated with statin medication.  Had bone density study May 2019 with lowest score -1.9.  Order placed for bone density study today for the near future.  Has not wanted to be on bone sparing therapy.  Had mammogram in December 2022.  Vaccines discussed.  Needs tetanus update which can be given at pharmacy.  We will get high-dose flu vaccine at pharmacy, COVID booster, consider RSV.  History of insomnia treated with Ambien  History of left wrist fracture 1997 and had to be repaired surgically  History of fibrocystic breast disease, diverticulosis of the colon status post laparoscopic colon resection.  Patient had tonsillectomy in the 1950s.  In March 11, 1993, she had a tubal repair laparotomy.  She had 3 ectopic pregnancies 1992, 1993 1995 age resulting in laparotomy.  Had colonoscopy in 03/11/2012 and repeat study is due.  Had mammogram December 2022.  Social history: She is retired.  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  She is married.  No history of smoking.  Occasional wine consumption.  She enjoys sewing.  She enjoys reading.  Family history: Father died in 03-11-00 with coronary disease, CVA, MI diabetes.  Mother died age 3 in 19 with history of hypertension and lung cancer.  3 brothers and 1 sister in good health.  No children.  Patient had a cousin who died of the sudden cardiac arrhythmia due to right ventricular  dysplasia/cardiomyopathy.  Patient went to [facility] and was screened for that condition and was found not to have it.  She says several family members have tested positive for the gene.    Review of Systems no chest pain, shortness of breath, abdominal issues, has some mild joint pain     Objective:   Physical Exam Vital signs reviewed  Skin: Warm and dry.  Nodes none.  TMs are clear.  Pharynx is clear.  Neck is supple.  Chest clear to auscultation without rales or wheezing.  Cardiac exam: Regular rate and rhythm.  Breasts are without masses.  GYN exam deferred.  No lower extremity pitting edema.  Brief neurological exam intact without gross focal deficits.       Assessment & Plan:   She has mixed hyperlipidemia-triglycerides are 168 and LDL is 108.  HDL is excellent at 68 and total cholesterol was 204.  She will remain on simvastatin 20 mg daily  Hypothyroidism treated with Synthroid 88 mcg daily and TSH is slightly low at 0.48 but she feels well.  Will not change dose.  History of glaucoma treated with Cosopt  Insomnia treated with Ambien as needed  Plan: Immunizations discussed.  Colonoscopy discussed.  Mammogram discussed.  Return in 1 year or as needed.  No change in medications.  If she so desires she can have coronary calcium screening for heart disease.

## 2022-08-27 NOTE — Patient Instructions (Addendum)
Please call [facility] regarding colonoscopy.  Vaccines discussed.  Continue current medications and follow-up in 1 year or as needed.  Have mammogram in the near future.  It was a pleasure to see you today.  Bone density study ordered.

## 2022-09-21 ENCOUNTER — Other Ambulatory Visit: Payer: Self-pay | Admitting: Internal Medicine

## 2022-09-21 DIAGNOSIS — Z1231 Encounter for screening mammogram for malignant neoplasm of breast: Secondary | ICD-10-CM

## 2022-10-01 DIAGNOSIS — M25562 Pain in left knee: Secondary | ICD-10-CM | POA: Diagnosis not present

## 2022-10-04 DIAGNOSIS — L820 Inflamed seborrheic keratosis: Secondary | ICD-10-CM | POA: Diagnosis not present

## 2022-11-11 ENCOUNTER — Ambulatory Visit
Admission: RE | Admit: 2022-11-11 | Discharge: 2022-11-11 | Disposition: A | Discharge status: Home / Self Care | Payer: Medicare Other | Source: Ambulatory Visit | Attending: Internal Medicine | Admitting: Internal Medicine

## 2022-11-11 DIAGNOSIS — Z1231 Encounter for screening mammogram for malignant neoplasm of breast: Secondary | ICD-10-CM | POA: Diagnosis not present

## 2022-11-16 ENCOUNTER — Other Ambulatory Visit: Payer: Self-pay | Admitting: Internal Medicine

## 2022-11-16 DIAGNOSIS — R928 Other abnormal and inconclusive findings on diagnostic imaging of breast: Secondary | ICD-10-CM

## 2022-12-01 ENCOUNTER — Ambulatory Visit: Payer: Medicare Other

## 2022-12-01 ENCOUNTER — Ambulatory Visit
Admission: RE | Admit: 2022-12-01 | Discharge: 2022-12-01 | Disposition: A | Discharge status: Home / Self Care | Payer: Medicare Other | Source: Ambulatory Visit | Attending: Internal Medicine | Admitting: Internal Medicine

## 2022-12-01 DIAGNOSIS — R928 Other abnormal and inconclusive findings on diagnostic imaging of breast: Secondary | ICD-10-CM

## 2022-12-01 DIAGNOSIS — R921 Mammographic calcification found on diagnostic imaging of breast: Secondary | ICD-10-CM | POA: Diagnosis not present

## 2022-12-20 ENCOUNTER — Ambulatory Visit (INDEPENDENT_AMBULATORY_CARE_PROVIDER_SITE_OTHER): Payer: Medicare Other | Admitting: Internal Medicine

## 2022-12-20 ENCOUNTER — Telehealth: Payer: Self-pay

## 2022-12-20 ENCOUNTER — Encounter: Payer: Self-pay | Admitting: Internal Medicine

## 2022-12-20 VITALS — BP 124/80 | HR 64 | Temp 98.3°F | Ht 61.0 in | Wt 135.1 lb

## 2022-12-20 DIAGNOSIS — N814 Uterovaginal prolapse, unspecified: Secondary | ICD-10-CM

## 2022-12-20 NOTE — Telephone Encounter (Signed)
Patient called stating she is having some vaginal issues not urgent or painful but concerning. She said something is hanging out of her vagina and has an odor.  Patient stated she is no longer seeing Ob/Gyn and would like to know if she needs to come in to see PCP or be referred to a specialist.

## 2022-12-20 NOTE — Telephone Encounter (Signed)
Pamela [name] called back and I went ahead and scheduled her for this afternoon at 4:00

## 2022-12-20 NOTE — Progress Notes (Addendum)
Subjective:    Patient ID: Pamela [name] , female    DOB: 1946/07/04, 77 y.o.    MRN: [id number]   77 y.o. female presents today for: Vaginal problem  Patient presents today for concerns of a pressure-like sensation in pelvic area. She states that she has had a few episodes of urinary urgency leading to incontinence. She had an episode a few days ago wherein she urinated on herself after sitting down in a chair suddenly. Later that evening she started to notice an odd sensation after wiping prompting her to make today's appointment. She denies any recent heavy lifting. She denies any vaginal discharge or bleeding as well as any dysuria. She does not feel as if she has any UTI symptoms.     Family History  Problem Relation Age of Onset   Colon cancer Paternal Uncle    Cancer Paternal Uncle        Colon   Bladder Cancer Paternal Uncle    Cancer Paternal Uncle        Prostate/Bladder   Diabetes Father    Heart disease Father    Stroke Father    Hypertension Mother    Cancer Mother        Lung   Prostate cancer Paternal Uncle    Skin cancer Paternal Uncle    Diabetes Other        father's side juvenile and adult onset   Cancer Paternal Grandfather        Skin/Stomach    Past Medical History:  Diagnosis Date   Colon polyps    Complication of anesthesia 1980's   laparoscopic procedure invitro, bradycardia and heart stopped [facility]   Diverticulitis    Diverticulosis    Fibrocystic breast disease    Hyperlipidemia    Hypertension    elevated bp in past, no current meds   Hypothyroidism    Osteopenia    Tubal ectopic pregnancy    x 3   Vaginal atrophy    Vitamin D deficiency      Social History   Social History Narrative   Not on file    Patient Care Team: Brooklyne Radke, Cresenciano Lick, MD as PCP - General (Internal Medicine) Lafayette Dragon, MD (Inactive) as Consulting Physician (Gastroenterology) Michael Boston, MD as Consulting Physician (General Surgery)   Review of  Systems  Constitutional:  Negative for fever and malaise/fatigue.  HENT:  Negative for congestion.   Eyes:  Negative for blurred vision.  Respiratory:  Negative for cough and shortness of breath.   Cardiovascular:  Negative for chest pain, palpitations and leg swelling.  Gastrointestinal:  Negative for vomiting.  Genitourinary:  Positive for urgency (incontinence). Negative for dysuria.  Musculoskeletal:  Negative for back pain.  Skin:  Negative for rash.  Neurological:  Negative for tremors, loss of consciousness and headaches.        Objective:   Vitals: BP 124/80   Pulse 64   Temp 98.3 F (36.8 C) (Tympanic)   Ht 5\' 1"  (1.549 m)   Wt 135 lb 1.9 oz (61.3 kg)   SpO2 99%   BMI 25.53 kg/m    Physical Exam Vitals and nursing note reviewed. Exam conducted with a chaperone present.  Constitutional:      General: She is not in acute distress.    Appearance: Normal appearance. She is not ill-appearing or toxic-appearing.  HENT:     Head: Normocephalic and atraumatic.  Genitourinary:    Urethra: Prolapse present.  Musculoskeletal:     Cervical back: Normal range of motion.  Skin:    General: Skin is warm.  Neurological:     General: No focal deficit present.     Mental Status: She is alert and oriented to person, place, and time. Mental status is at baseline.  Psychiatric:        Mood and Affect: Mood normal.        Behavior: Behavior normal.        Thought Content: Thought content normal.        Judgment: Judgment normal.          Assessment & Plan:   Vaginal prolapse: Patient provided with a referral for OBGYN today and advised on close follow up.   I,Alexis Herring,acting as a Education administrator for Elby Showers, MD.,have documented all relevant documentation on the behalf of Elby Showers, MD,as directed by  Elby Showers, MD while in the presence of Elby Showers, MD.   I, Elby Showers, MD, have reviewed all documentation for this visit. The documentation on 12/22/22  for the exam, diagnosis, procedures, and orders are all accurate and complete.

## 2022-12-20 NOTE — Telephone Encounter (Signed)
Left voicemail for patient to call the office to schedule an office visit 

## 2022-12-22 NOTE — Patient Instructions (Signed)
Patient will be referred to GYN for uterine prolapse.  She may need a pessary.

## 2023-01-05 DIAGNOSIS — H401132 Primary open-angle glaucoma, bilateral, moderate stage: Secondary | ICD-10-CM | POA: Diagnosis not present

## 2023-02-13 ENCOUNTER — Other Ambulatory Visit: Payer: Self-pay | Admitting: Internal Medicine

## 2023-03-03 ENCOUNTER — Ambulatory Visit (HOSPITAL_BASED_OUTPATIENT_CLINIC_OR_DEPARTMENT_OTHER): Payer: Medicare Other | Admitting: Obstetrics & Gynecology

## 2023-03-03 ENCOUNTER — Encounter (HOSPITAL_BASED_OUTPATIENT_CLINIC_OR_DEPARTMENT_OTHER): Payer: Self-pay

## 2023-03-03 ENCOUNTER — Encounter (HOSPITAL_BASED_OUTPATIENT_CLINIC_OR_DEPARTMENT_OTHER): Payer: Self-pay | Admitting: Obstetrics & Gynecology

## 2023-03-03 VITALS — BP 178/96 | HR 50 | Ht 62.0 in | Wt 138.2 lb

## 2023-03-03 DIAGNOSIS — R339 Retention of urine, unspecified: Secondary | ICD-10-CM

## 2023-03-03 DIAGNOSIS — R3911 Hesitancy of micturition: Secondary | ICD-10-CM | POA: Diagnosis not present

## 2023-03-03 DIAGNOSIS — N952 Postmenopausal atrophic vaginitis: Secondary | ICD-10-CM

## 2023-03-03 DIAGNOSIS — N8110 Cystocele, unspecified: Secondary | ICD-10-CM | POA: Diagnosis not present

## 2023-03-03 NOTE — Patient Instructions (Signed)
Coconut oil Replens vaginal moisturizer Gynetrof vaginal moisturizer

## 2023-03-03 NOTE — Progress Notes (Signed)
GYNECOLOGY  VISIT  CC:   concerns regarding prolapse  HPI: 77 y.o. G3 Ectopic 3 Married White or Caucasian female here for new patient appointment regarding pelvic prolapse.  She is feeling pressure and that she can feel something at the vagina.  Biggest issue is with urinary urgency and incomplete emptying.  She does have some urinary incontinence.  When she has the void to urge, she must get to the bathroom or she will leak.  She gets up 1-2 times a night.  Denies vaginal discharge or vaginal discharge.    No hx of abnormal paps and no STDs hx.  Having yearly mammograms.     Past Medical History:  Diagnosis Date   Colon polyps    Complication of anesthesia 1980's   laparoscopic procedure invitro, brady[facility] and heart [facility]   Diverticulitis    Diverticulosis    Fibrocystic breast disease    Hyperlipidemia    Hypertension    elevated bp in past, no current meds   Hypothyroidism    Osteopenia    Tubal ectopic pregnancy    x 3   Vaginal atrophy    Vitamin D deficiency     MEDS:   Current Outpatient Medications on File Prior to Visit  Medication Sig Dispense Refill   acetaminophen (TYLENOL) 500 MG tablet Take 1,000 mg by mouth every 6 (six) hours as needed for pain.     cholecalciferol (VITAMIN D) 1000 UNITS tablet Take 1,000 Units by mouth daily.     dorzolamide-timolol (COSOPT) 22.3-6.8 MG/ML ophthalmic solution SMARTSIG:In Eye(s)     levothyroxine (SYNTHROID) 88 MCG tablet TAKE 1 TABLET(88 MCG) BY MOUTH DAILY 90 tablet 1   simvastatin (ZOCOR) 20 MG tablet TAKE 1 TABLET(20 MG) BY MOUTH AT BEDTIME 90 tablet 3   zolpidem (AMBIEN) 5 MG tablet Take 1 tablet (5 mg total) by mouth at bedtime as needed. 30 tablet 0   No current facility-administered medications on file prior to visit.    ALLERGIES: Succinylcholine, Boniva [ibandronate sodium], Morphine and related, and Penicillins  SH:  married, non smoker  Review of Systems  Constitutional: Negative.    Genitourinary:        At times, difficulty starting stream and, at times, feeling incomplete emptying    PHYSICAL EXAMINATION:    BP (!) 178/96 (BP Location: Left Arm, Patient Position: Sitting, Cuff Size: Small)   Pulse (!) 50   Ht 5\' 2"  (1.575 m) Comment: Reported  Wt 138 lb 3.2 oz (62.7 kg)   BMI 25.28 kg/m     General appearance: alert, cooperative and appears stated age NLymph:  no inguinal LAD noted  Pelvic: External genitalia:  no lesions              Urethra:  normal appearing urethra with no masses, tenderness or lesions              Bartholins and Skenes: normal                 Vagina: normal appearing vagina with normal color and discharge, no lesions, grade 2 cystocele noted but vagina narrow              Cervix: no lesions              Bimanual Exam:  Uterus:  normal size, contour, position, consistency, mobility, non-tender              Adnexa: no mass, fullness, tenderness  Rectovaginal: Yes.                Anus:  normal sphincter tone, no lesions  Chaperone, Ina Homesonya Clarkfield, CMA, was present for exam.  Assessment/Plan: 1. 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  To address urinary issues as well, feel pessary use is reasonable initial treatment.  I am concerned about narrow vaginal and tolerating a pessary.  Will order small size and have pt return for fitting.  2. Vaginal atrophy - OTC products for treatment discussed.  3. Incomplete emptying of bladder  4. Urinary hesitancy - pt may need urodynamic testing as well but will start with pessary use

## 2023-03-06 DIAGNOSIS — N8110 Cystocele, unspecified: Secondary | ICD-10-CM | POA: Insufficient documentation

## 2023-03-06 DIAGNOSIS — R339 Retention of urine, unspecified: Secondary | ICD-10-CM | POA: Insufficient documentation

## 2023-03-06 DIAGNOSIS — N952 Postmenopausal atrophic vaginitis: Secondary | ICD-10-CM | POA: Insufficient documentation

## 2023-03-07 ENCOUNTER — Ambulatory Visit
Admission: RE | Admit: 2023-03-07 | Discharge: 2023-03-07 | Disposition: A | Discharge status: Home / Self Care | Payer: Medicare Other | Source: Ambulatory Visit | Attending: Internal Medicine | Admitting: Internal Medicine

## 2023-03-07 DIAGNOSIS — M85851 Other specified disorders of bone density and structure, right thigh: Secondary | ICD-10-CM | POA: Diagnosis not present

## 2023-03-07 DIAGNOSIS — Z780 Asymptomatic menopausal state: Secondary | ICD-10-CM | POA: Diagnosis not present

## 2023-03-09 ENCOUNTER — Encounter (HOSPITAL_BASED_OUTPATIENT_CLINIC_OR_DEPARTMENT_OTHER): Payer: Self-pay | Admitting: Obstetrics & Gynecology

## 2023-03-09 ENCOUNTER — Ambulatory Visit (HOSPITAL_BASED_OUTPATIENT_CLINIC_OR_DEPARTMENT_OTHER): Payer: Medicare Other | Admitting: Obstetrics & Gynecology

## 2023-03-09 VITALS — BP 148/90 | HR 54 | Ht 61.0 in | Wt 137.8 lb

## 2023-03-09 DIAGNOSIS — R339 Retention of urine, unspecified: Secondary | ICD-10-CM | POA: Diagnosis not present

## 2023-03-09 DIAGNOSIS — R030 Elevated blood-pressure reading, without diagnosis of hypertension: Secondary | ICD-10-CM

## 2023-03-09 DIAGNOSIS — N8110 Cystocele, unspecified: Secondary | ICD-10-CM

## 2023-03-09 DIAGNOSIS — N952 Postmenopausal atrophic vaginitis: Secondary | ICD-10-CM | POA: Diagnosis not present

## 2023-03-09 NOTE — Progress Notes (Addendum)
77 y.o. Married White female [id number] here for pessary fitting due to cystocele and sensation of incomplete bladder emptying.  Pt does have a small introitus so smaller pessary sizes were ordered.  She is not SA.    Allergies  Allergen Reactions   Succinylcholine Other (See Comments)    BRADYCARDIA/  ASYSTOLE 1980's   Boniva [Ibandronate Sodium]     GI   Morphine And Related Nausea And Vomiting   Penicillins Hives    ROS: Urinary:  incomplete bladder emptying  Exam:   BP (!) 148/90   Pulse (!) 54   Ht 5\' 1"  (1.549 m) Comment: Reported  Wt 137 lb 12.8 oz (62.5 kg)   BMI 26.04 kg/m   General appearance: alert and no distress Inguinal lymph nodes:  not enlarged  Pelvic: External genitalia:  no lesions              Urethra: normal appearing urethra with no masses, tenderness or lesions              Bartholins and Skenes: Bartholin's, Urethra, Skene's normal                 Vagina: normal appearing vagina with normal color and discharge, no lesions, cystocele               Pessary fitting:  #1 Miltex ring with support was placed without difficulty.  Pt comfortable after placement.  Did not want to try and void.  Able to walk easily.  Assessment/Plan: 1. Female cystocele - pessary placed.  Recheck 2 months  2. Elevated blood pressure reading without diagnosis of hypertension - pt is going to monitor at home.  She has BP cuff at home  3. Incomplete emptying of bladder - will reassess in 2 months  4. Vaginal atrophy - may need to treat with topical agent depending on findings at follow up.  Will discuss again at follow up visit.

## 2023-03-09 NOTE — Addendum Note (Signed)
Addended by: Jerene Bears on: 03/09/2023 12:43 PM   Modules accepted: Level of Service

## 2023-05-09 ENCOUNTER — Ambulatory Visit (HOSPITAL_BASED_OUTPATIENT_CLINIC_OR_DEPARTMENT_OTHER): Payer: Medicare Other | Admitting: Obstetrics & Gynecology

## 2023-05-09 ENCOUNTER — Encounter (HOSPITAL_BASED_OUTPATIENT_CLINIC_OR_DEPARTMENT_OTHER): Payer: Self-pay | Admitting: Obstetrics & Gynecology

## 2023-05-09 VITALS — BP 160/86 | HR 60 | Ht 62.0 in | Wt 139.4 lb

## 2023-05-09 DIAGNOSIS — N952 Postmenopausal atrophic vaginitis: Secondary | ICD-10-CM | POA: Diagnosis not present

## 2023-05-09 DIAGNOSIS — N8110 Cystocele, unspecified: Secondary | ICD-10-CM

## 2023-05-09 DIAGNOSIS — Z4689 Encounter for fitting and adjustment of other specified devices: Secondary | ICD-10-CM

## 2023-05-12 ENCOUNTER — Encounter (HOSPITAL_BASED_OUTPATIENT_CLINIC_OR_DEPARTMENT_OTHER): Payer: Self-pay | Admitting: Obstetrics & Gynecology

## 2023-05-12 NOTE — Progress Notes (Signed)
77 y.o. Married White female [id number] here for pessary check.  She reports she is doing well.  Denies vaginal bleeding or discharge.  Feels she is emptying her bladder well.  She is not sexually active.    Patient has been using following pessary style and size:  #1.  Allergies  Allergen Reactions   Succinylcholine Other (See Comments)    BRADYCARDIA/  ASYSTOLE 1980's   Boniva [Ibandronate Sodium]     GI   Morphine And Codeine Nausea And Vomiting   Penicillins Hives    ROS: Denies dysuria, vaginal bleeding, vaginal discharge  Exam:   BP (!) 160/86 (BP Location: Left Arm, Patient Position: Sitting, Cuff Size: Normal)   Pulse 60   Ht 5\' 2"  (1.575 m) Comment: Reported  Wt 139 lb 6.4 oz (63.2 kg)   BMI 25.50 kg/m   General appearance: alert and no distress Inguinal lymph nodes:  not enlarged  Pelvic: External genitalia:  no lesions              Urethra: normal appearing urethra with no masses, tenderness or lesions              Bartholins and Skenes: Bartholin's, Urethra, Skene's normal                 Vagina: normal appearing vagina with normal color and discharge, no lesions              Cervix: normal appearance   Pap obtained:  no Bimanual Exam:  Uterus:  uterus is normal size, shape, consistency and nontender                               Adnexa:    normal adnexa in size, nontender and no masses                               Anus:  normal sphincter tone, no lesions  Pessary was removed without difficulty.  Pessary was cleansed.  Pessary was replaced. Patient tolerated procedure well.    Assessment/Plan: 1. Female cystocele - will recheck 3 months  2. Vaginal atrophy  3. Pessary maintenance

## 2023-07-09 ENCOUNTER — Other Ambulatory Visit: Payer: Self-pay

## 2023-07-09 ENCOUNTER — Encounter (HOSPITAL_COMMUNITY): Payer: Self-pay

## 2023-07-09 ENCOUNTER — Ambulatory Visit (HOSPITAL_COMMUNITY)
Admission: EM | Admit: 2023-07-09 | Discharge: 2023-07-09 | Disposition: A | Discharge status: Home / Self Care | Payer: Medicare Other

## 2023-07-09 ENCOUNTER — Encounter (HOSPITAL_COMMUNITY): Payer: Self-pay | Admitting: Emergency Medicine

## 2023-07-09 ENCOUNTER — Emergency Department (HOSPITAL_COMMUNITY)
Admission: EM | Admit: 2023-07-09 | Discharge: 2023-07-09 | Disposition: A | Discharge status: Home / Self Care | Payer: Medicare Other | Source: Home / Self Care | Attending: Emergency Medicine | Admitting: Emergency Medicine

## 2023-07-09 DIAGNOSIS — Z8719 Personal history of other diseases of the digestive system: Secondary | ICD-10-CM | POA: Diagnosis not present

## 2023-07-09 DIAGNOSIS — R8290 Unspecified abnormal findings in urine: Secondary | ICD-10-CM | POA: Diagnosis not present

## 2023-07-09 DIAGNOSIS — R9389 Abnormal findings on diagnostic imaging of other specified body structures: Secondary | ICD-10-CM | POA: Diagnosis not present

## 2023-07-09 DIAGNOSIS — R9431 Abnormal electrocardiogram [ECG] [EKG]: Secondary | ICD-10-CM | POA: Diagnosis not present

## 2023-07-09 DIAGNOSIS — L0291 Cutaneous abscess, unspecified: Secondary | ICD-10-CM

## 2023-07-09 DIAGNOSIS — R590 Localized enlarged lymph nodes: Secondary | ICD-10-CM | POA: Diagnosis not present

## 2023-07-09 DIAGNOSIS — L03315 Cellulitis of perineum: Secondary | ICD-10-CM | POA: Diagnosis not present

## 2023-07-09 DIAGNOSIS — N764 Abscess of vulva: Secondary | ICD-10-CM | POA: Insufficient documentation

## 2023-07-09 DIAGNOSIS — Z880 Allergy status to penicillin: Secondary | ICD-10-CM | POA: Diagnosis not present

## 2023-07-09 DIAGNOSIS — E039 Hypothyroidism, unspecified: Secondary | ICD-10-CM | POA: Diagnosis not present

## 2023-07-09 DIAGNOSIS — N762 Acute vulvitis: Secondary | ICD-10-CM | POA: Diagnosis not present

## 2023-07-09 DIAGNOSIS — L03319 Cellulitis of trunk, unspecified: Secondary | ICD-10-CM | POA: Diagnosis not present

## 2023-07-09 DIAGNOSIS — N732 Unspecified parametritis and pelvic cellulitis: Secondary | ICD-10-CM | POA: Diagnosis not present

## 2023-07-09 DIAGNOSIS — Z91018 Allergy to other foods: Secondary | ICD-10-CM | POA: Diagnosis not present

## 2023-07-09 DIAGNOSIS — R197 Diarrhea, unspecified: Secondary | ICD-10-CM | POA: Insufficient documentation

## 2023-07-09 DIAGNOSIS — R8281 Pyuria: Secondary | ICD-10-CM | POA: Diagnosis not present

## 2023-07-09 DIAGNOSIS — Z885 Allergy status to narcotic agent status: Secondary | ICD-10-CM | POA: Diagnosis not present

## 2023-07-09 DIAGNOSIS — E785 Hyperlipidemia, unspecified: Secondary | ICD-10-CM | POA: Diagnosis not present

## 2023-07-09 DIAGNOSIS — I10 Essential (primary) hypertension: Secondary | ICD-10-CM | POA: Insufficient documentation

## 2023-07-09 DIAGNOSIS — Z823 Family history of stroke: Secondary | ICD-10-CM | POA: Diagnosis not present

## 2023-07-09 DIAGNOSIS — M7989 Other specified soft tissue disorders: Secondary | ICD-10-CM | POA: Diagnosis not present

## 2023-07-09 DIAGNOSIS — R112 Nausea with vomiting, unspecified: Secondary | ICD-10-CM | POA: Insufficient documentation

## 2023-07-09 DIAGNOSIS — E876 Hypokalemia: Secondary | ICD-10-CM | POA: Diagnosis not present

## 2023-07-09 DIAGNOSIS — D6489 Other specified anemias: Secondary | ICD-10-CM | POA: Diagnosis not present

## 2023-07-09 DIAGNOSIS — M8580 Other specified disorders of bone density and structure, unspecified site: Secondary | ICD-10-CM | POA: Diagnosis not present

## 2023-07-09 DIAGNOSIS — Z833 Family history of diabetes mellitus: Secondary | ICD-10-CM | POA: Diagnosis not present

## 2023-07-09 DIAGNOSIS — Z1152 Encounter for screening for COVID-19: Secondary | ICD-10-CM | POA: Diagnosis not present

## 2023-07-09 DIAGNOSIS — R102 Pelvic and perineal pain: Secondary | ICD-10-CM | POA: Diagnosis not present

## 2023-07-09 DIAGNOSIS — Z800 Family history of malignant neoplasm of digestive organs: Secondary | ICD-10-CM | POA: Diagnosis not present

## 2023-07-09 DIAGNOSIS — D6959 Other secondary thrombocytopenia: Secondary | ICD-10-CM | POA: Diagnosis not present

## 2023-07-09 DIAGNOSIS — Z8052 Family history of malignant neoplasm of bladder: Secondary | ICD-10-CM | POA: Diagnosis not present

## 2023-07-09 DIAGNOSIS — Z8249 Family history of ischemic heart disease and other diseases of the circulatory system: Secondary | ICD-10-CM | POA: Diagnosis not present

## 2023-07-09 DIAGNOSIS — B370 Candidal stomatitis: Secondary | ICD-10-CM | POA: Diagnosis not present

## 2023-07-09 DIAGNOSIS — Z79890 Hormone replacement therapy (postmenopausal): Secondary | ICD-10-CM | POA: Diagnosis not present

## 2023-07-09 MED ORDER — DOXYCYCLINE HYCLATE 100 MG PO TABS
100.0000 mg | ORAL_TABLET | Freq: Once | ORAL | Status: AC
  Administered 2023-07-09: 100 mg via ORAL
  Filled 2023-07-09: qty 1

## 2023-07-09 MED ORDER — FENTANYL CITRATE PF 50 MCG/ML IJ SOSY
50.0000 ug | PREFILLED_SYRINGE | Freq: Once | INTRAMUSCULAR | Status: AC
  Administered 2023-07-09: 50 ug via INTRAVENOUS
  Filled 2023-07-09: qty 1

## 2023-07-09 MED ORDER — DOXYCYCLINE HYCLATE 100 MG PO CAPS: 100.0000 mg | ORAL_CAPSULE | Freq: Two times a day (BID) | ORAL | 0 refills | Status: DC

## 2023-07-09 MED ORDER — LIDOCAINE-EPINEPHRINE (PF) 2 %-1:200000 IJ SOLN
20.0000 mL | Freq: Once | INTRAMUSCULAR | Status: AC
  Administered 2023-07-09: 20 mL
  Filled 2023-07-09: qty 20

## 2023-07-09 MED ORDER — ACETAMINOPHEN 500 MG PO TABS
1000.0000 mg | ORAL_TABLET | Freq: Once | ORAL | Status: AC
  Administered 2023-07-09: 1000 mg via ORAL
  Filled 2023-07-09: qty 2

## 2023-07-09 MED ORDER — FENTANYL CITRATE PF 50 MCG/ML IJ SOSY
50.0000 ug | PREFILLED_SYRINGE | Freq: Once | INTRAMUSCULAR | Status: AC | PRN
  Administered 2023-07-09: 50 ug via INTRAVENOUS
  Filled 2023-07-09: qty 1

## 2023-07-09 NOTE — ED Triage Notes (Addendum)
Pt came in via POV d/t a vaginal abcess that has been bothering her the last 2 days. A/Ox4, rates her pain 5/10 while in triage.

## 2023-07-09 NOTE — ED Notes (Signed)
Shift report received, assumed care of patient at this time 

## 2023-07-09 NOTE — ED Provider Notes (Signed)
[facility]    CSN: [id number] Arrival date & time: 07/09/23  1232      History   Chief Complaint Chief Complaint  Patient presents with   Cyst    HPI Pamela [name] is a 77 y.o. female.   77 yr old female who noticed pain and swelling in her right labia starting Thursday morning. Since then the area has worsened significantly and now is making it hard for the patient to sit or walk comfortably. She also has had chills, diarrhea, fevers, vomiting, dizziness and feeling achy all over. She has not had any drainage but reports the area has gotten very swollen and hard with severe pain with touching. She has done compresses and soaking in tub and using heat but it has not helped. Ob/gyn noticed a cyst in June but told patient it would resolve on its on. She denies any other hx of cyst/abscesses in the genital area.      Past Medical History:  Diagnosis Date   Colon polyps    Complication of anesthesia 1980's   laparoscopic procedure invitro, brady[facility] and heart [facility]   Diverticulitis    Diverticulosis    Fibrocystic breast disease    Hyperlipidemia    Hypertension    elevated bp in past, no current meds   Hypothyroidism    Osteopenia    Tubal ectopic pregnancy    x 3   Vaginal atrophy    Vitamin D deficiency     Patient Active Problem List   Diagnosis Date Noted   Female cystocele 03/06/2023   Vaginal atrophy 03/06/2023   Incomplete emptying of bladder 03/06/2023   Family history of cardiac disorder 03/27/2014   Hypertension    Diverticulitis of sigmoid colon s/p robotic colectomy 09/04/2013 08/14/2013   Hypothyroidism 06/26/2011   Osteopenia 06/26/2011   Fibrocystic breast disease 06/26/2011   Hyperlipidemia 06/26/2011   Insomnia 06/26/2011    Past Surgical History:  Procedure Laterality Date   BREAST CYST ASPIRATION  2003   ECTOPIC PREGNANCY SURGERY  1980s   x3   FERTILITY SURGERY     x4 IVF   PARTIAL COLECTOMY     recurrent  diverticulitis   tubal repair  1980's   WRIST FRACTURE SURGERY Left 1987    OB History     Gravida  3   Para      Term      Preterm      AB  3   Living         SAB      IAB      Ectopic  3   Multiple      Live Births               Home Medications    Prior to Admission medications   Medication Sig Start Date End Date Taking? Authorizing Provider  acetaminophen (TYLENOL) 500 MG tablet Take 1,000 mg by mouth every 6 (six) hours as needed for pain.    [provider]  cholecalciferol (VITAMIN D) 1000 UNITS tablet Take 1,000 Units by mouth daily.    [provider]  dorzolamide-timolol (COSOPT) 22.3-6.8 MG/ML ophthalmic solution SMARTSIG:In Eye(s) 01/28/22   [provider]  levothyroxine (SYNTHROID) 88 MCG tablet TAKE 1 TABLET(88 MCG) BY MOUTH DAILY 02/13/23   [name], MD  simvastatin (ZOCOR) 20 MG tablet TAKE 1 TABLET(20 MG) BY MOUTH AT BEDTIME 08/25/22   [name], MD  zolpidem (AMBIEN) 5 MG  tablet Take 1 tablet (5 mg total) by mouth at bedtime as needed. Patient not taking: Reported on 07/09/2023 08/22/20   [name], MD    Family History Family History  Problem Relation Age of Onset   Colon cancer Paternal Uncle    Cancer Paternal Uncle        Colon   Bladder Cancer Paternal Uncle    Cancer Paternal Uncle        Prostate/Bladder   Diabetes Father    Heart disease Father    Stroke Father    Hypertension Mother    Cancer Mother        Lung   Prostate cancer Paternal Uncle    Skin cancer Paternal Uncle    Diabetes Other        father's side juvenile and adult onset   Cancer Paternal Grandfather        Skin/Stomach    Social History Social History   Tobacco Use   Smoking status: Never   Smokeless tobacco: Never  Vaping Use   Vaping status: Never Used  Substance Use Topics   Alcohol use: Yes    Alcohol/week: 0.0 standard drinks of alcohol    Comment: 1 per day   Drug use: No     Allergies    Succinylcholine, Boniva [ibandronate sodium], Morphine and codeine, and Penicillins   Review of Systems Review of Systems  Constitutional:  Positive for activity change, appetite change (No appetite), chills, fatigue and fever.  HENT:  Negative for congestion and sore throat.   Respiratory:  Negative for shortness of breath.   Cardiovascular:  Negative for chest pain.  Gastrointestinal:  Positive for diarrhea, nausea and vomiting. Negative for abdominal pain.  Genitourinary:  Positive for pelvic pain and vaginal pain. Negative for difficulty urinating.  Skin:  Positive for color change (Erythema in the right labia).  Neurological:  Positive for dizziness, weakness and light-headedness. Negative for tremors and syncope.     Physical Exam Triage Vital Signs ED Triage Vitals  Encounter Vitals Group     BP      Systolic BP Percentile      Diastolic BP Percentile      Pulse      Resp      Temp      Temp src      SpO2      Weight      Height      Head Circumference      Peak Flow      Pain Score      Pain Loc      Pain Education      Exclude from Growth Chart    No data found.  Updated Vital Signs BP (!) 164/84 (BP Location: Left Arm)   Pulse 84   Temp 98.3 F (36.8 C) (Oral)   Resp 18   SpO2 96%   Visual Acuity Right Eye Distance:   Left Eye Distance:   Bilateral Distance:    Right Eye Near:   Left Eye Near:    Bilateral Near:     Physical Exam Vitals and nursing note reviewed.  Constitutional:      General: She is in acute distress (Appears in pain).     Appearance: She is well-developed.  HENT:     Head: Normocephalic and atraumatic.  Cardiovascular:     Rate and Rhythm: Normal rate.     Heart sounds: Normal heart sounds.  Pulmonary:     Effort:  Pulmonary effort is normal. No respiratory distress.     Breath sounds: Normal breath sounds.  Abdominal:     Palpations: Abdomen is soft.     Tenderness: There is no abdominal tenderness.   Musculoskeletal:        General: Normal range of motion.  Skin:    General: Skin is warm and dry.     Capillary Refill: Capillary refill takes less than 2 seconds.       Neurological:     General: No focal deficit present.     Mental Status: She is alert.  Psychiatric:        Mood and Affect: Mood normal.        Behavior: Behavior normal.        Thought Content: Thought content normal.        Judgment: Judgment normal.      UC Treatments / Results  Labs (all labs ordered are listed, but only abnormal results are displayed) Labs Reviewed - No data to display  EKG   Radiology No results found.  Procedures Procedures (including critical care time)  Medications Ordered in UC Medications - No data to display  Initial Impression / Assessment and Plan / UC Course  I have reviewed the triage vital signs and the nursing notes.  Pertinent labs & imaging results that were available during my care of the patient were reviewed by me and considered in my medical decision making (see chart for details).     Right labia and mons pubis cellulitis and with abscess: diffuse and very large labia abscess with extension to the mons pubis as well as systematic symptoms concerning for early sepsis. Discussed with patient that this likely needs imaging before proceeding with any surgical intervention and is likely to large to attempt to drain in the Urgent Care setting. Will have the patient go to the ER for further evaluation and treatment. Patient's husband is with her and will take her by car to the ER. Patient also seen and evaluated by Dr. Tracie Harrier who agrees with the treatment plan. Final Clinical Impressions(s) / UC Diagnoses   Final diagnoses:  Abscess of right genital labia     Discharge Instructions      Patient sent to ER for further evaluation and treatment due to extent of abscess.      ED Prescriptions   None    PDMP not reviewed this encounter.   Landis Martins, [place] 07/09/23 1521

## 2023-07-09 NOTE — Discharge Instructions (Signed)
Patient sent to ER for further evaluation and treatment due to extent of abscess.

## 2023-07-09 NOTE — ED Notes (Signed)
Patient is being discharged from the Urgent Care and sent to the Emergency Department via POV . Per Dr Tracie Harrier, patient is in need of higher level of care due to limited resources. Patient is aware and verbalizes understanding of plan of care.  Vitals:   07/09/23 1447  BP: (!) 164/84  Pulse: 84  Resp: 18  Temp: 98.3 F (36.8 C)  SpO2: 96%

## 2023-07-09 NOTE — ED Provider Notes (Incomplete)
Patient is a 77 year old female presenting today with a 2-day history of worsening pain and swelling in her vaginal area.  On exam patient has a large abscess involving her right labia and a small area of her perineum.  She has no crepitus or findings concerning for Fournier's.  She has had some systemic symptoms of low-grade fever and chills.  She is not diabetic.  She has had no recent procedures.  She is hemodynamically stable at this time.  Bedside ultrasound with drainable pocket of fluid.

## 2023-07-09 NOTE — Discharge Instructions (Signed)
You were seen today for labial abscess. While you were here we monitored your vitals, preformed a physical exam, and performed imaging studies of your abscess. This revealed a drainable fluid collection in the bottom portion of the abscess.  We have successfully drained the fluid, and have provided you with a outpatient prescription for antibiotics to help clear up the infection.  Things to do:  - Follow up with your primary care provider within the next 1-2 weeks -Take your doxycycline antibiotic as prescribed.  You may fill this medication at any pharmacy.  Return to the emergency department if you have any new or worsening symptoms including worsening cellulitis or abscess, fevers, chills, or if you have any other concerns.  Please present back to the emergency department if you feel that your abscess is not healing appropriately with the antibiotic.

## 2023-07-09 NOTE — ED Triage Notes (Addendum)
Reports a cyst in vaginal area.  Thursday noticed pain, chills, aching.    Patient has tested negative for covid at home.  Since then, cyst in labia is "huge and hard"  Has had hot baths and hot showers, has not taken any medications

## 2023-07-09 NOTE — ED Notes (Signed)
Pt is a&ox4, pwd. Pt complains of 9/10 vaginal pain due to abscess. Pt denies any other complaints. Pt is attached to vitals. Side rails up x 2, call light with patient, warm blankets provided, husband at the bedside.

## 2023-07-09 NOTE — ED Notes (Signed)
At bedside for provider examination of abscess.  Very large right labia, red, swollen, painful

## 2023-07-09 NOTE — ED Provider Notes (Signed)
[place] EMERGENCY DEPARTMENT AT [facility] Provider Note  MDM   HPI/ROS:  Pamela [name] is a 77 y.o. female presenting to the emergency department with chief complaint of labial abscess.  Patient has been experiencing pain and swelling in her right labia since Thursday.  She has noticed that the lesion has become larger, more irritated and painful, and now is accompanied by redness and warmth of surrounding tissues.  Patient also expresses concern for subjective fevers and chills that been going on since Thursday.  She has been attempting to use warm compresses that she feels are exacerbating her symptoms.  Patient is also experienced nausea, vomiting, diarrhea over the past few days, symptoms started Thursday.  No history of diabetes, not taking any medications currently aside from simvastatin and Synthroid.  Physical exam is notable for: - Large right sided labial abscess measuring approximately 8 x 8 cm with erythema and induration.  Erythema and cellulitic change extending to the mons pubis  On my initial evaluation, patient is:  -Vital signs stable. Patient afebrile, hemodynamically stable, and non-toxic appearing.  Bedside ultrasound performed to evaluate for drainable fluid collections.  Identified approximately 2 x 2 cm fluid collection in inferior portion of abscess along labia majora.  Given fluid collection identified on ultrasound, bedside I&D performed with several cc of purulent drainage.  See linked procedure note for further details.  Disposition:  {ED Dispo:29898}  Clinical Impression: No diagnosis found.  Rx / DC Orders ED Discharge Orders     None       The plan for this patient was discussed with Dr. Anitra Lauth, who voiced agreement and who oversaw evaluation and treatment of this patient.   Clinical Complexity A medically appropriate history, review of systems, and physical exam was performed.  My independent interpretations of EKG, labs, and  radiology are documented in the ED course above.   Click here for ABCD2, HEART and other calculatorsREFRESH Note before signing   Patient's presentation is most consistent with {EM COPA:27473}  Medical Decision Making Risk OTC drugs. Prescription drug management.    HPI/ROS      See MDM section for pertinent HPI and ROS. A complete ROS was performed with pertinent positives/negatives noted above.   Past Medical History:  Diagnosis Date   Colon polyps    Complication of anesthesia 1980's   laparoscopic procedure invitro, brady[facility] and heart [facility]   Diverticulitis    Diverticulosis    Fibrocystic breast disease    Hyperlipidemia    Hypertension    elevated bp in past, no current meds   Hypothyroidism    Osteopenia    Tubal ectopic pregnancy    x 3   Vaginal atrophy    Vitamin D deficiency     Past Surgical History:  Procedure Laterality Date   BREAST CYST ASPIRATION  2003   ECTOPIC PREGNANCY SURGERY  1980s   x3   FERTILITY SURGERY     x4 IVF   PARTIAL COLECTOMY     recurrent diverticulitis   tubal repair  1980's   WRIST FRACTURE SURGERY Left 1987      Physical Exam   Vitals:   07/09/23 1525 07/09/23 1832  BP: (!) 176/85 (!) 147/74  Pulse: 87 79  Resp: 16 16  Temp: 99.5 F (37.5 C) 98.3 F (36.8 C)  TempSrc: Oral Oral  SpO2: 95% 100%    Physical Exam   Procedures    Procedures   Starleen Arms, MD Department of Emergency  Medicine   Please note that this documentation was produced with the assistance of voice-to-text technology and may contain errors.

## 2023-07-11 ENCOUNTER — Inpatient Hospital Stay (HOSPITAL_COMMUNITY)
Admission: EM | Admit: 2023-07-11 | Discharge: 2023-07-17 | DRG: 746 | Disposition: A | Discharge status: Home / Self Care | Payer: Medicare Other | Attending: Student | Admitting: Student

## 2023-07-11 ENCOUNTER — Other Ambulatory Visit: Payer: Self-pay

## 2023-07-11 ENCOUNTER — Encounter (HOSPITAL_COMMUNITY): Payer: Self-pay

## 2023-07-11 ENCOUNTER — Emergency Department (HOSPITAL_COMMUNITY): Payer: Medicare Other

## 2023-07-11 DIAGNOSIS — N762 Acute vulvitis: Secondary | ICD-10-CM | POA: Diagnosis present

## 2023-07-11 DIAGNOSIS — Z8249 Family history of ischemic heart disease and other diseases of the circulatory system: Secondary | ICD-10-CM | POA: Diagnosis not present

## 2023-07-11 DIAGNOSIS — D6489 Other specified anemias: Secondary | ICD-10-CM | POA: Diagnosis not present

## 2023-07-11 DIAGNOSIS — E785 Hyperlipidemia, unspecified: Secondary | ICD-10-CM

## 2023-07-11 DIAGNOSIS — L03315 Cellulitis of perineum: Secondary | ICD-10-CM | POA: Diagnosis not present

## 2023-07-11 DIAGNOSIS — Z1152 Encounter for screening for COVID-19: Secondary | ICD-10-CM

## 2023-07-11 DIAGNOSIS — Z800 Family history of malignant neoplasm of digestive organs: Secondary | ICD-10-CM | POA: Diagnosis not present

## 2023-07-11 DIAGNOSIS — R8281 Pyuria: Secondary | ICD-10-CM | POA: Diagnosis present

## 2023-07-11 DIAGNOSIS — Z880 Allergy status to penicillin: Secondary | ICD-10-CM

## 2023-07-11 DIAGNOSIS — B370 Candidal stomatitis: Secondary | ICD-10-CM | POA: Diagnosis not present

## 2023-07-11 DIAGNOSIS — Z833 Family history of diabetes mellitus: Secondary | ICD-10-CM | POA: Diagnosis not present

## 2023-07-11 DIAGNOSIS — E876 Hypokalemia: Secondary | ICD-10-CM

## 2023-07-11 DIAGNOSIS — Z79890 Hormone replacement therapy (postmenopausal): Secondary | ICD-10-CM | POA: Diagnosis not present

## 2023-07-11 DIAGNOSIS — N764 Abscess of vulva: Principal | ICD-10-CM | POA: Diagnosis present

## 2023-07-11 DIAGNOSIS — L03319 Cellulitis of trunk, unspecified: Secondary | ICD-10-CM | POA: Diagnosis not present

## 2023-07-11 DIAGNOSIS — Z79899 Other long term (current) drug therapy: Secondary | ICD-10-CM

## 2023-07-11 DIAGNOSIS — Z91018 Allergy to other foods: Secondary | ICD-10-CM

## 2023-07-11 DIAGNOSIS — Z823 Family history of stroke: Secondary | ICD-10-CM | POA: Diagnosis not present

## 2023-07-11 DIAGNOSIS — E039 Hypothyroidism, unspecified: Secondary | ICD-10-CM | POA: Diagnosis not present

## 2023-07-11 DIAGNOSIS — Z885 Allergy status to narcotic agent status: Secondary | ICD-10-CM

## 2023-07-11 DIAGNOSIS — M8580 Other specified disorders of bone density and structure, unspecified site: Secondary | ICD-10-CM | POA: Diagnosis present

## 2023-07-11 DIAGNOSIS — D6959 Other secondary thrombocytopenia: Secondary | ICD-10-CM | POA: Diagnosis not present

## 2023-07-11 DIAGNOSIS — R8290 Unspecified abnormal findings in urine: Secondary | ICD-10-CM

## 2023-07-11 DIAGNOSIS — Z8719 Personal history of other diseases of the digestive system: Secondary | ICD-10-CM | POA: Diagnosis not present

## 2023-07-11 DIAGNOSIS — L0390 Cellulitis, unspecified: Secondary | ICD-10-CM | POA: Diagnosis present

## 2023-07-11 DIAGNOSIS — Z808 Family history of malignant neoplasm of other organs or systems: Secondary | ICD-10-CM

## 2023-07-11 DIAGNOSIS — Z8052 Family history of malignant neoplasm of bladder: Secondary | ICD-10-CM

## 2023-07-11 DIAGNOSIS — I10 Essential (primary) hypertension: Secondary | ICD-10-CM | POA: Diagnosis present

## 2023-07-11 DIAGNOSIS — R102 Pelvic and perineal pain: Secondary | ICD-10-CM | POA: Diagnosis present

## 2023-07-11 LAB — COMPREHENSIVE METABOLIC PANEL
ALT: 16 U/L (ref 0–44)
AST: 20 U/L (ref 15–41)
Albumin: 3.3 g/dL — ABNORMAL LOW (ref 3.5–5.0)
Alkaline Phosphatase: 58 U/L (ref 38–126)
Anion gap: 13 (ref 5–15)
BUN: 14 mg/dL (ref 8–23)
CO2: 23 mmol/L (ref 22–32)
Calcium: 9 mg/dL (ref 8.9–10.3)
Chloride: 96 mmol/L — ABNORMAL LOW (ref 98–111)
Creatinine, Ser: 0.79 mg/dL (ref 0.44–1.00)
GFR, Estimated: >60 mL/min (ref >60)
Glucose, Bld: 124 mg/dL — ABNORMAL HIGH (ref 70–99)
Potassium: 3.2 mmol/L — ABNORMAL LOW (ref 3.5–5.1)
Sodium: 132 mmol/L — ABNORMAL LOW (ref 135–145)
Total Bilirubin: 0.7 mg/dL (ref 0.3–1.2)
Total Protein: 7.3 g/dL (ref 6.5–8.1)

## 2023-07-11 LAB — CBC WITH DIFFERENTIAL/PLATELET
Abs Immature Granulocytes: 0.04 10*3/uL (ref 0.00–0.07)
Basophils Absolute: 0 10*3/uL (ref 0.0–0.1)
Basophils Relative: 0 %
Eosinophils Absolute: 0 10*3/uL (ref 0.0–0.5)
Eosinophils Relative: 0 %
HCT: 38.2 % (ref 36.0–46.0)
Hemoglobin: 13.1 g/dL (ref 12.0–15.0)
Immature Granulocytes: 0 %
Lymphocytes Relative: 9 %
Lymphs Abs: 0.9 10*3/uL (ref 0.7–4.0)
MCH: 30.7 pg (ref 26.0–34.0)
MCHC: 34.3 g/dL (ref 30.0–36.0)
MCV: 89.5 fL (ref 80.0–100.0)
Monocytes Absolute: 0.7 10*3/uL (ref 0.1–1.0)
Monocytes Relative: 7 %
Neutro Abs: 8.8 10*3/uL — ABNORMAL HIGH (ref 1.7–7.7)
Neutrophils Relative %: 84 %
Platelets: 175 10*3/uL (ref 150–400)
RBC: 4.27 MIL/uL (ref 3.87–5.11)
RDW: 12.7 % (ref 11.5–15.5)
WBC: 10.5 10*3/uL (ref 4.0–10.5)
nRBC: 0 % (ref 0.0–0.2)

## 2023-07-11 LAB — URINALYSIS, W/ REFLEX TO CULTURE (INFECTION SUSPECTED)
Bilirubin Urine: NEGATIVE
Glucose, UA: NEGATIVE mg/dL
Ketones, ur: NEGATIVE mg/dL
Nitrite: NEGATIVE
Protein, ur: 30 mg/dL — AB
Specific Gravity, Urine: 1.006 (ref 1.005–1.030)
pH: 6 (ref 5.0–8.0)

## 2023-07-11 LAB — RESP PANEL BY RT-PCR (RSV, FLU A&B, COVID)  RVPGX2
Influenza A by PCR: NEGATIVE
Influenza B by PCR: NEGATIVE
Resp Syncytial Virus by PCR: NEGATIVE
SARS Coronavirus 2 by RT PCR: NEGATIVE

## 2023-07-11 LAB — PROTIME-INR
INR: 1.1 (ref 0.8–1.2)
Prothrombin Time: 14.2 seconds (ref 11.4–15.2)

## 2023-07-11 LAB — TSH: TSH: 0.29 u[IU]/mL — ABNORMAL LOW (ref 0.350–4.500)

## 2023-07-11 LAB — I-STAT CG4 LACTIC ACID, ED: Lactic Acid, Venous: 1.6 mmol/L (ref 0.5–1.9)

## 2023-07-11 LAB — URINE CULTURE

## 2023-07-11 LAB — APTT: aPTT: 31 seconds (ref 24–36)

## 2023-07-11 MED ORDER — VANCOMYCIN HCL IN DEXTROSE 1-5 GM/200ML-% IV SOLN: 1000.0000 mg | Freq: Once | INTRAVENOUS | Status: DC

## 2023-07-11 MED ORDER — HYDRALAZINE HCL 20 MG/ML IJ SOLN: 10.0000 mg | INTRAMUSCULAR | Status: DC | PRN

## 2023-07-11 MED ORDER — LACTATED RINGERS IV BOLUS (SEPSIS)
1000.0000 mL | Freq: Once | INTRAVENOUS | Status: AC
  Administered 2023-07-11: 1000 mL via INTRAVENOUS

## 2023-07-11 MED ORDER — LACTATED RINGERS IV SOLN: INTRAVENOUS | Status: AC

## 2023-07-11 MED ORDER — VANCOMYCIN HCL 1250 MG/250ML IV SOLN
1250.0000 mg | Freq: Once | INTRAVENOUS | Status: AC
  Administered 2023-07-11: 1250 mg via INTRAVENOUS
  Filled 2023-07-11: qty 250

## 2023-07-11 MED ORDER — SIMVASTATIN 20 MG PO TABS
20.0000 mg | ORAL_TABLET | Freq: Every day | ORAL | Status: DC
  Administered 2023-07-12 – 2023-07-16 (×5): 20 mg via ORAL
  Filled 2023-07-11 (×5): qty 1

## 2023-07-11 MED ORDER — LEVOTHYROXINE SODIUM 88 MCG PO TABS
88.0000 ug | ORAL_TABLET | Freq: Every day | ORAL | Status: DC
  Administered 2023-07-12 – 2023-07-17 (×6): 88 ug via ORAL
  Filled 2023-07-11 (×6): qty 1

## 2023-07-11 MED ORDER — ALBUTEROL SULFATE (2.5 MG/3ML) 0.083% IN NEBU: 2.5000 mg | INHALATION_SOLUTION | Freq: Four times a day (QID) | RESPIRATORY_TRACT | Status: DC | PRN

## 2023-07-11 MED ORDER — ACETAMINOPHEN 650 MG RE SUPP: 650.0000 mg | Freq: Four times a day (QID) | RECTAL | Status: DC | PRN

## 2023-07-11 MED ORDER — VANCOMYCIN HCL IN DEXTROSE 1-5 GM/200ML-% IV SOLN
1000.0000 mg | INTRAVENOUS | Status: DC
  Administered 2023-07-12 – 2023-07-15 (×4): 1000 mg via INTRAVENOUS
  Filled 2023-07-11 (×4): qty 200

## 2023-07-11 MED ORDER — SODIUM CHLORIDE 0.9 % IV SOLN
2.0000 g | Freq: Two times a day (BID) | INTRAVENOUS | Status: DC
  Administered 2023-07-12 – 2023-07-16 (×9): 2 g via INTRAVENOUS
  Filled 2023-07-11 (×9): qty 12.5

## 2023-07-11 MED ORDER — POTASSIUM CHLORIDE CRYS ER 20 MEQ PO TBCR
40.0000 meq | EXTENDED_RELEASE_TABLET | ORAL | Status: AC
  Administered 2023-07-11: 40 meq via ORAL
  Filled 2023-07-11: qty 2

## 2023-07-11 MED ORDER — DORZOLAMIDE HCL-TIMOLOL MAL 2-0.5 % OP SOLN
1.0000 [drp] | Freq: Two times a day (BID) | OPHTHALMIC | Status: DC
  Administered 2023-07-11 – 2023-07-17 (×12): 1 [drp] via OPHTHALMIC
  Filled 2023-07-11: qty 10

## 2023-07-11 MED ORDER — SODIUM CHLORIDE 0.9 % IV SOLN
2.0000 g | Freq: Once | INTRAVENOUS | Status: AC
  Administered 2023-07-11: 2 g via INTRAVENOUS
  Filled 2023-07-11: qty 12.5

## 2023-07-11 MED ORDER — SODIUM CHLORIDE 0.9% FLUSH
3.0000 mL | Freq: Two times a day (BID) | INTRAVENOUS | Status: DC
  Administered 2023-07-11 – 2023-07-17 (×11): 3 mL via INTRAVENOUS

## 2023-07-11 MED ORDER — ONDANSETRON HCL 4 MG/2ML IJ SOLN
4.0000 mg | Freq: Four times a day (QID) | INTRAMUSCULAR | Status: DC | PRN
  Administered 2023-07-11: 4 mg via INTRAVENOUS
  Filled 2023-07-11: qty 2

## 2023-07-11 MED ORDER — IOHEXOL 350 MG/ML SOLN
75.0000 mL | Freq: Once | INTRAVENOUS | Status: AC | PRN
  Administered 2023-07-11: 75 mL via INTRAVENOUS

## 2023-07-11 MED ORDER — METRONIDAZOLE 500 MG/100ML IV SOLN
500.0000 mg | Freq: Once | INTRAVENOUS | Status: AC
  Administered 2023-07-11: 500 mg via INTRAVENOUS
  Filled 2023-07-11: qty 100

## 2023-07-11 MED ORDER — HYDROCODONE-ACETAMINOPHEN 5-325 MG PO TABS
1.0000 | ORAL_TABLET | Freq: Four times a day (QID) | ORAL | Status: DC | PRN
  Administered 2023-07-12 – 2023-07-16 (×12): 1 via ORAL
  Filled 2023-07-11 (×12): qty 1

## 2023-07-11 MED ORDER — MORPHINE SULFATE (PF) 2 MG/ML IV SOLN
2.0000 mg | INTRAVENOUS | Status: DC | PRN
  Administered 2023-07-11 – 2023-07-13 (×4): 2 mg via INTRAVENOUS
  Filled 2023-07-11 (×4): qty 1

## 2023-07-11 MED ORDER — ACETAMINOPHEN 325 MG PO TABS
650.0000 mg | ORAL_TABLET | Freq: Four times a day (QID) | ORAL | Status: DC | PRN
  Administered 2023-07-13 – 2023-07-15 (×8): 650 mg via ORAL
  Filled 2023-07-11 (×7): qty 2

## 2023-07-11 MED ORDER — ENOXAPARIN SODIUM 40 MG/0.4ML IJ SOSY
40.0000 mg | PREFILLED_SYRINGE | INTRAMUSCULAR | Status: DC
  Administered 2023-07-12 – 2023-07-16 (×5): 40 mg via SUBCUTANEOUS
  Filled 2023-07-11 (×6): qty 0.4

## 2023-07-11 NOTE — Progress Notes (Signed)
ED Pharmacy Antibiotic Sign Off An antibiotic consult was received from an ED provider for sepsis per pharmacy dosing for Vancomycin and cefepime. A chart review was completed to assess appropriateness.   The following one time order(s) were placed:  Vancomycin 1250 mg IV x 1 Cefepime 2g IV x 1  Further antibiotic and/or antibiotic pharmacy consults should be ordered by the admitting provider if indicated.   Thank you for allowing pharmacy to be a part of this patient's care.   Daylene Posey, [facility]  Clinical Pharmacist 07/11/23 1:33 PM

## 2023-07-11 NOTE — ED Triage Notes (Addendum)
Pt came in via POV d/t an abscess on the Rt side of her labia. Pt reports it was opened & drained 2 days ago & now has returned d/t cellulitis around the area (per pt) & the pain has worsened as well as swelling in the area, denies fevers but has been having chills. A/Ox4, rates her pain 8/10.

## 2023-07-11 NOTE — ED Provider Notes (Signed)
[place] EMERGENCY DEPARTMENT AT [facility] Provider Note   CSN: [id number] Arrival date & time: 07/11/23  1138     History  Chief Complaint  Patient presents with   Abcess on Labia    Pamela [name] is a 77 y.o. female.  This is a 77 year old female who is here today for repeat visit for right labial swelling.  Patient was seen in the emergency department on the 10th, had a labial abscess that was drained.  She was discharged with doxycycline.  She comes back today because she has had increasing pain and swelling in that area.  She says that last night she noticed that her close that she slept and were soaked with sweat.  She is not sure if she has had a fever otherwise.          Home Medications Prior to Admission medications   Medication Sig Start Date End Date Taking? Authorizing Provider  acetaminophen (TYLENOL) 500 MG tablet Take 1,000 mg by mouth every 6 (six) hours as needed for pain.    [provider]  cholecalciferol (VITAMIN D) 1000 UNITS tablet Take 1,000 Units by mouth daily.    [provider]  dorzolamide-timolol (COSOPT) 22.3-6.8 MG/ML ophthalmic solution SMARTSIG:In Eye(s) 01/28/22   [provider]  doxycycline (VIBRAMYCIN) 100 MG capsule Take 1 capsule (100 mg total) by mouth 2 (two) times daily for 10 days. 07/09/23 07/19/23  Dyanne Iha, MD  levothyroxine (SYNTHROID) 88 MCG tablet TAKE 1 TABLET(88 MCG) BY MOUTH DAILY 02/13/23   [name], MD  simvastatin (ZOCOR) 20 MG tablet TAKE 1 TABLET(20 MG) BY MOUTH AT BEDTIME 08/25/22   [name], MD  zolpidem (AMBIEN) 5 MG tablet Take 1 tablet (5 mg total) by mouth at bedtime as needed. Patient not taking: Reported on 07/09/2023 08/22/20   [name], MD      Allergies    Succinylcholine, Sandrea Hammond [ibandronate sodium], Morphine and codeine, and Penicillins    Review of Systems   Review of Systems  Physical Exam Updated Vital Signs BP 132/87 (BP Location:  Left Arm)   Pulse 76   Temp 97.7 F (36.5 C) (Oral)   Resp 18   Ht 5\' 1"  (1.549 m)   Wt 61.2 kg   SpO2 95%   BMI 25.51 kg/m  Physical Exam Vitals reviewed.  Constitutional:      Appearance: She is not toxic-appearing.  Genitourinary:    Comments: Nurse present as a Biomedical engineer.  The entirety of the right labia there is a fluctuant mass, it is erythematous it is tender.  There is no active drainage at this time Neurological:     Mental Status: She is alert.     ED Results / Procedures / Treatments   Labs (all labs ordered are listed, but only abnormal results are displayed) Labs Reviewed  COMPREHENSIVE METABOLIC PANEL - Abnormal; Notable for the following components:      Result Value   Sodium 132 (*)    Potassium 3.2 (*)    Chloride 96 (*)    Glucose, Bld 124 (*)    Albumin 3.3 (*)    All other components within normal limits  CBC WITH DIFFERENTIAL/PLATELET - Abnormal; Notable for the following components:   Neutro Abs 8.8 (*)    All other components within normal limits  URINALYSIS, W/ REFLEX TO CULTURE (INFECTION SUSPECTED) - Abnormal; Notable for the following components:   Hgb urine dipstick MODERATE (*)    Protein,  ur 30 (*)    Leukocytes,Ua LARGE (*)    Bacteria, UA RARE (*)    All other components within normal limits  RESP PANEL BY RT-PCR (RSV, FLU A&B, COVID)  RVPGX2  CULTURE, BLOOD (ROUTINE X 2)  CULTURE, BLOOD (ROUTINE X 2)  URINE CULTURE  PROTIME-INR  APTT  I-STAT CG4 LACTIC ACID, ED  I-STAT CG4 LACTIC ACID, ED    EKG None  Radiology CT PELVIS W CONTRAST  Result Date: 07/11/2023 CLINICAL DATA:  Perianal abscess or fistula suspected. Labial abscess. EXAM: CT PELVIS WITH CONTRAST TECHNIQUE: Multidetector CT imaging of the pelvis was performed using the standard protocol following the bolus administration of intravenous contrast. RADIATION DOSE REDUCTION: This exam was performed according to the departmental dose-optimization program which includes  automated exposure control, adjustment of the mA and/or kV according to patient size and/or use of iterative reconstruction technique. CONTRAST:  75mL OMNIPAQUE IOHEXOL 350 MG/ML SOLN COMPARISON:  CT abdomen and pelvis 07/18/2013 FINDINGS: Urinary Tract: Normal appearance of the bladder. No wall thickening, stone, or filling defect. Distal ureters are not dilated. Bowel: Visualized portions of small and large bowel are not abnormally distended. No wall thickening or inflammatory changes are seen. Appendix is normal. There is an anastomosis at the rectosigmoid:. Vascular/Lymphatic: Calcification of the aorta and iliac arteries. No aneurysm. Reproductive: Uterus and ovaries are not enlarged. No abnormal adnexal masses are seen. A vaginal pessary is in place. Other: Diffuse infiltration and edema in the soft tissues of the right side of the labia extending inferiorly into the right perineum and superiorly into the anterior subcutaneous fat over the pubic symphysis. There is mild skin thickening. Changes are most consistent with cellulitis. No discrete loculated collection is identified. No soft tissue gas is identified. Enlarged lymph nodes are present in the right groin and right pelvis, likely reactive. Musculoskeletal: Degenerative changes in the lower lumbar spine and hips. No destructive bone lesions appreciated. IMPRESSION: 1. Extensive soft tissue swelling and infiltration in the subcutaneous fat involving the right labia and extending into the right perineal region and right groin/symphysis pubis subcutaneous fat. Changes are most consistent with cellulitis. No loculated collection. No soft tissue gas. 2. Aortic atherosclerosis. 3. Prominent right groin and pelvic lymph nodes are likely reactive. Electronically Signed   By: Burman Nieves M.D.   On: 07/11/2023 15:39    Procedures Procedures    Medications Ordered in ED Medications  lactated ringers infusion (has no administration in time range)   vancomycin (VANCOREADY) IVPB 1250 mg/250 mL (has no administration in time range)  lactated ringers bolus 1,000 mL (1,000 mLs Intravenous New Bag/Given 07/11/23 1404)  ceFEPIme (MAXIPIME) 2 g in sodium chloride 0.9 % 100 mL IVPB (0 g Intravenous Stopped 07/11/23 1437)  metroNIDAZOLE (FLAGYL) IVPB 500 mg (500 mg Intravenous New Bag/Given 07/11/23 1409)  iohexol (OMNIPAQUE) 350 MG/ML injection 75 mL (75 mLs Intravenous Contrast Given 07/11/23 1509)    ED Course/ Medical Decision Making/ A&P                                 Medical Decision Making This is a 77 year old female is here today with a likely right labial abscess.  Plan-unfortunately, patient failed outpatient therapy.  I am going to start the patient on broad-spectrum antibiotics given her reported episode of night sweats.  Patient states that the area has grown in size, the redness has increased.  No evidence of necrotizing soft  tissue infection at this time.  Looking at the previous size of this abscess, it appears that it was 8 x 8 cm.  Will consult gynecology.  Reassessment-spoke with Dr. Berton Lan.  She agrees with current treatment plan, will, evaluate the patient.  Patient still pending imaging.  Remained stable.  Reassessment-CT imaging shows no fluid collection, likely just cellulitis.  As patient has failed outpatient therapy, believe she would benefit from IV antibiotics.  Gynecology agrees.  Will admit to hospitalist.  Gyn will consult as needed.  Amount and/or Complexity of Data Reviewed Labs: ordered. Radiology: ordered. ECG/medicine tests: ordered.  Risk Prescription drug management.           Final Clinical Impression(s) / ED Diagnoses Final diagnoses:  Cellulitis of labia majora    Rx / DC Orders ED Discharge Orders     None         Arletha Pili, DO 07/11/23 1551

## 2023-07-11 NOTE — H&P (Signed)
History and Physical    Patient: Pamela [name] ZOX:[id number] DOB: [birth year] DOA: 07/11/2023 DOS: the patient was seen and examined on 07/11/2023 PCP: [name], MD  Patient coming from: Home  Chief Complaint:  Chief Complaint  Patient presents with   Abcess on Labia   HPI: SIMON [name] is a 77 y.o. female with medical history significant of hypertension, hyperlipidemia,   hypothyroidism, and cystocele who presents with complaints of swelling and pain of her right labia.  She reported having acute onset of generalized malaise starting 4 days ago with decreased appetite.  The following day she noted swelling and redness of her right labia.  Pain worsens with any movement or pressure on the area.  Noted having associated symptoms of subjective fever, chills, lightheadedness, nausea, vomiting x 1 episode, decreased p.o. intake, and diarrhea.  She tried using warm compresses and taking hot baths, but had not noticed any drainage.  She was seen in the urgent care due to her symptoms and sent to the emergency department for further evaluation where she had evaluation which noted 2 x 2 cm fluid collection in the inferior portion of the labia majora.  Patient underwent bedside I&D with purulent drainage released.  No cultures were sent at that time.  She was discharged home on doxycycline and had taken about 3 doses.  Overnight patient noted progressively worsening swelling and pain as well as diaphoresis for which she thought she may be having continued fevers and came to the hospital for further evaluation.   In the emergency department patient was noted to be afebrile with relatively stable vital signs.  Labs significant for WBC 10.5 with concern for left shift, potassium 3.2 CT scan of the abdomen pelvis noted extensive soft tissue swelling and infiltration in the subcutaneous fat involving the right labia and extending into Right perineal region consistent with cellulitis.  Urinalysis noted  moderate hemoglobin, large leukocytes, rare bacteria, and 11-20 WBCs.  OB/GYN had been consulted but felt no surgical intervention required.  Blood and urine cultures have been obtained.  Patient had been given bolus of 1 L of lactated Ringer's, metronidazole, cefepime, and vancomycin.   Review of Systems: As mentioned in the history of present illness. All other systems reviewed and are negative. Past Medical History:  Diagnosis Date   Colon polyps    Complication of anesthesia 1980's   laparoscopic procedure invitro, br[facility] and heart [facility]   Diverticulitis    Diverticulosis    Fibrocystic breast disease    Hyperlipidemia    Hypertension    elevated bp in past, no current meds   Hypothyroidism    Osteopenia    Tubal ectopic pregnancy    x 3   Vaginal atrophy    Vitamin D deficiency    Past Surgical History:  Procedure Laterality Date   BREAST CYST ASPIRATION  2003   ECTOPIC PREGNANCY SURGERY  1980s   x3   FERTILITY SURGERY     x4 IVF   PARTIAL COLECTOMY     recurrent diverticulitis   tubal repair  1980's   WRIST FRACTURE SURGERY Left 1987   Social History:  reports that she has never smoked. She has never used smokeless tobacco. She reports current alcohol use. She reports that she does not use drugs.  Allergies  Allergen Reactions   Succinylcholine Other (See Comments)    BRADYCARDIA/  ASYSTOLE 1980's   Boniva [Ibandronate Sodium]     GI   Morphine And Codeine  Nausea And Vomiting   Penicillins Hives    Family History  Problem Relation Age of Onset   Colon cancer Paternal Uncle    Cancer Paternal Uncle        Colon   Bladder Cancer Paternal Uncle    Cancer Paternal Uncle        Prostate/Bladder   Diabetes Father    Heart disease Father    Stroke Father    Hypertension Mother    Cancer Mother        Lung   Prostate cancer Paternal Uncle    Skin cancer Paternal Uncle    Diabetes Other        father's side juvenile and adult onset   Cancer  Paternal Grandfather        Skin/Stomach    Prior to Admission medications   Medication Sig Start Date End Date Taking? Authorizing Provider  acetaminophen (TYLENOL) 500 MG tablet Take 1,000 mg by mouth every 6 (six) hours as needed for pain.    [provider]  cholecalciferol (VITAMIN D) 1000 UNITS tablet Take 1,000 Units by mouth daily.    [provider]  dorzolamide-timolol (COSOPT) 22.3-6.8 MG/ML ophthalmic solution SMARTSIG:In Eye(s) 01/28/22   [provider]  doxycycline (VIBRAMYCIN) 100 MG capsule Take 1 capsule (100 mg total) by mouth 2 (two) times daily for 10 days. 07/09/23 07/19/23  Dyanne Iha, MD  levothyroxine (SYNTHROID) 88 MCG tablet TAKE 1 TABLET(88 MCG) BY MOUTH DAILY 02/13/23   [name], MD  simvastatin (ZOCOR) 20 MG tablet TAKE 1 TABLET(20 MG) BY MOUTH AT BEDTIME 08/25/22   [name], MD  zolpidem (AMBIEN) 5 MG tablet Take 1 tablet (5 mg total) by mouth at bedtime as needed. Patient not taking: Reported on 07/09/2023 08/22/20   [name], MD    Physical Exam: Vitals:   07/11/23 1146 07/11/23 1600 07/11/23 1608  BP: 132/87 (!) 160/91   Pulse: 76 84 82  Resp: 18 20 (!) 26  Temp: 97.7 F (36.5 C)  (!) 97.4 F (36.3 C)  TempSrc: Oral  Oral  SpO2: 95% 100% 100%  Weight: 61.2 kg    Height: 5\' 1"  (1.549 m)     Constitutional: Elderly female who appears to be in some discomfort but able to follow commands. Eyes: PERRL, lids and conjunctivae normal ENMT: Mucous membranes are moist. Posterior pharynx clear of any exudate or lesions.Normal dentition.  Neck: normal, supple, no masses, no thyromegaly Respiratory: clear to auscultation bilaterally, no wheezing, no crackles. Normal respiratory effort. No accessory muscle use.  Cardiovascular: Regular rate and rhythm, no murmurs / rubs / gallops. No extremity edema. 2+ pedal pulses. No carotid bruits.  Abdomen: Tenderness palpation of the lower abdomen.  Normal bowel  sounds. Musculoskeletal: no clubbing / cyanosis. No joint deformity upper and lower extremities. Good ROM, no contractures. Normal muscle tone.  Skin: no rashes, lesions, ulcers. No induration Neurologic: CN 2-12 grossly intact. Sensation intact, DTR normal. Strength 5/5 in all 4.  Psychiatric: Normal judgment and insight. Alert and oriented x 3. Normal mood.   Data Reviewed:  EKG reveals sinus rhythm at 85 bpm.  Reviewed labs, imaging, and pertinent records as documented in this note.  Assessment and Plan: Cellulitis of the right labia Acute.  Presented with progressive swelling and pain of the right labia after recently having I&D performed on 8/10.  No prior cultures of the fluid have been obtained.  CT imaging noted extensive soft tissue swelling of the  right labia and extending into the right perineal region and groin without fluid collection or gas present.  Sepsis criteria not met.  Patient had been started on empiric antibiotics of vancomycin, metronidazole, and cefepime.  Case had been discussed with Dr. Berton Lan of OB/GYN who did not feel need of I&D at this time. -Admit to a medical telemetry bed -Follow-up blood cultures -Continue vancomycin and cefepime per pharmacy.  De-escalate medically appropriate -Hydrocodone/morphine IV as needed for moderate to severe pain respectively -Tylenol as needed for fever -Formally consult OB/GYN if needed  Abnormal urinalysis Acute.  Urinalysis noted moderate hemoglobin, large leukocytes, rare bacteria, and 11-20 WBCs.   -Follow-up urine culture -Urinalysis noted moderate hemoglobin, large leukocytes, rare bacteria, and 11-20 WBCs  Hypokalemia Acute.  Initial potassium noted to be 3.2. -Give potassium chloride 40 meq p.o.  Hyperlipidemia -Continue simvastatin  Hypothyroidism -Check TSH -Continue levothyroxine  DVT prophylaxis: Lovenox: Advance Care Planning:   Code Status: Full Code    Consults: None  Family Communication:  Called  husband no answer.  Voicemail left.  Severity of Illness: The appropriate patient status for this patient is INPATIENT. Inpatient status is judged to be reasonable and necessary in order to provide the required intensity of service to ensure the patient's safety. The patient's presenting symptoms, physical exam findings, and initial radiographic and laboratory data in the context of their chronic comorbidities is felt to place them at high risk for further clinical deterioration. Furthermore, it is not anticipated that the patient will be medically stable for discharge from the hospital within 2 midnights of admission.   * I certify that at the point of admission it is my clinical judgment that the patient will require inpatient hospital care spanning beyond 2 midnights from the point of admission due to high intensity of service, high risk for further deterioration and high frequency of surveillance required.*  Author: Clydie Braun, MD 07/11/2023 4:13 PM  For on call review [email or web address].

## 2023-07-11 NOTE — Progress Notes (Signed)
Pharmacy Antibiotic Note  Pamela [name] is a 77 y.o. female admitted on 07/11/2023 with  R labial cellulitis .  S/p I&D bedside with purulent drainage 8/12. Pharmacy has been consulted for Vancomycin dosing.  Plan: Vanc 1250mg  IV load ordered  Vancomycin 1000 mg IV Q 24 hrs. Goal AUC 400-550. Expected AUC: 534 SCr used: 0.8 Will f/u renal function, micro data, and pt's clinical condition Vanc levels prn   Height: 5\' 1"  (154.9 cm) Weight: 61.2 kg (135 lb) IBW/kg (Calculated) : 47.8  Temp (24hrs), Avg:97.6 F (36.4 C), Min:97.4 F (36.3 C), Max:97.7 F (36.5 C)  Recent Labs  Lab 07/11/23 1344 07/11/23 1354  WBC  --  10.5  CREATININE  --  0.79  LATICACIDVEN 1.6  --     Estimated Creatinine Clearance: 51 mL/min (by C-G formula based on SCr of 0.79 mg/dL).    Allergies  Allergen Reactions   Succinylcholine Other (See Comments)    BRADYCARDIA/  ASYSTOLE 1980's   Boniva [Ibandronate Sodium]     GI   Morphine And Codeine Nausea And Vomiting   Penicillins Hives    Antimicrobials this admission: 8/12 Flagyl/Cefepime x 1 8/12  Vanc >>   Microbiology results: 8/12 BCx:  8/12 UCx:    Thank you for allowing pharmacy to be a part of this patient's care.  Christoper Fabian, PharmD, BCPS Please see amion for complete clinical pharmacist phone list 07/11/2023 5:15 PM

## 2023-07-11 NOTE — Sepsis Progress Note (Signed)
Sepsis protocol is being followed by [facility]. 

## 2023-07-11 NOTE — ED Notes (Signed)
ED TO INPATIENT HANDOFF REPORT  ED Nurse Name and Phone #: Conrad Zajkowski, RN [phone]  S Name/Age/Gender Pamela [name] 77 y.o. female Room/Bed: 044C/044C  Code Status   Code Status: Full Code  Home/SNF/Other Home Patient oriented to: self, place, time, and situation Is this baseline? Yes   Triage Complete: Triage complete  Chief Complaint Cellulitis [L03.90]  Triage Note Pt came in via POV d/t an abscess on the Rt side of her labia. Pt reports it was opened & drained 2 days ago & now has returned d/t cellulitis around the area (per pt) & the pain has worsened as well as swelling in the area, denies fevers but has been having chills. A/Ox4, rates her pain 8/10.   Allergies Allergies  Allergen Reactions   Succinylcholine Other (See Comments)    BRADYCARDIA/  ASYSTOLE 1980's   Boniva [Ibandronate Sodium]     GI   Morphine And Codeine Nausea And Vomiting   Penicillins Hives    Level of Care/Admitting Diagnosis ED Disposition     ED Disposition  Admit   Condition  --   Comment  Hospital Area: [facility] [[id number]]  Level of Care: Telemetry Medical [104]  May admit patient to [facility] or [facility] if equivalent level of care is available:: No  Covid Evaluation: Asymptomatic - no recent exposure (last 10 days) testing not required  Diagnosis: Cellulitis [119147]  Admitting Physician: Clydie Braun [[id number]]  Attending Physician: Clydie Braun [[id number]]  Certification:: I certify this patient will need inpatient services for at least 2 midnights  Estimated Length of Stay: 2          B Medical/Surgery History Past Medical History:  Diagnosis Date   Colon polyps    Complication of anesthesia 1980's   laparoscopic procedure invitro, [facility] heart [facility]   Diverticulitis    Diverticulosis    Fibrocystic breast disease    Hyperlipidemia    Hypertension    elevated bp in past, no current meds   Hypothyroidism    Osteopenia     Tubal ectopic pregnancy    x 3   Vaginal atrophy    Vitamin D deficiency    Past Surgical History:  Procedure Laterality Date   BREAST CYST ASPIRATION  2003   ECTOPIC PREGNANCY SURGERY  1980s   x3   FERTILITY SURGERY     x4 IVF   PARTIAL COLECTOMY     recurrent diverticulitis   tubal repair  1980's   WRIST FRACTURE SURGERY Left 1987     A IV Location/Drains/Wounds Patient Lines/Drains/Airways Status     Active Line/Drains/Airways     Name Placement date Placement time Site Days   Peripheral IV 07/11/23 20 G Right Antecubital 07/11/23  1335  Antecubital  less than 1   Peripheral IV 07/11/23 22 G Left Antecubital 07/11/23  1351  Antecubital  less than 1   Pain Pump Left LUQ 09/04/13  1220  -- 3597   Incision - 5 Ports Abdomen 1: Umbilicus 2: Right;Mid 3: Right;Lateral 4: Left;Mid 5: Lateral;Left 09/04/13  0845  -- 3597            Intake/Output Last 24 hours No intake or output data in the 24 hours ending 07/11/23 1808  Labs/Imaging Results for orders placed or performed during the hospital encounter of 07/11/23 (from the past 48 hour(s))  I-Stat Lactic Acid, ED     Status: None   Collection Time: 07/11/23  1:44 PM  Result Value Ref Range   Lactic Acid, Venous 1.6 0.5 - 1.9 mmol/L  Resp panel by RT-PCR (RSV, Flu A&B, Covid) Anterior Nasal Swab     Status: None   Collection Time: 07/11/23  1:54 PM   Specimen: Anterior Nasal Swab  Result Value Ref Range   SARS Coronavirus 2 by RT PCR NEGATIVE NEGATIVE   Influenza A by PCR NEGATIVE NEGATIVE   Influenza B by PCR NEGATIVE NEGATIVE    Comment: (NOTE) The Xpert Xpress SARS-CoV-2/FLU/RSV plus assay is intended as an aid in the diagnosis of influenza from Nasopharyngeal swab specimens and should not be used as a sole basis for treatment. Nasal washings and aspirates are unacceptable for Xpert Xpress SARS-CoV-2/FLU/RSV testing.  Fact Sheet for Patients: BloggerCourse.com  Fact Sheet for  Healthcare Providers: SeriousBroker.it  This test is not yet approved or cleared by the [place] FDA and has been authorized for detection and/or diagnosis of SARS-CoV-2 by FDA under an Emergency Use Authorization (EUA). This EUA will remain in effect (meaning this test can be used) for the duration of the COVID-19 declaration under Section 564(b)(1) of the Act, 21 U.S.C. section 360bbb-3(b)(1), unless the authorization is terminated or revoked.     Resp Syncytial Virus by PCR NEGATIVE NEGATIVE    Comment: (NOTE) Fact Sheet for Patients: BloggerCourse.com  Fact Sheet for Healthcare Providers: SeriousBroker.it  This test is not yet approved or cleared by the [place] FDA and has been authorized for detection and/or diagnosis of SARS-CoV-2 by FDA under an Emergency Use Authorization (EUA). This EUA will remain in effect (meaning this test can be used) for the duration of the COVID-19 declaration under Section 564(b)(1) of the Act, 21 U.S.C. section 360bbb-3(b)(1), unless the authorization is terminated or revoked.  Performed at [facility], [address] [place], [place] [address]   Comprehensive metabolic panel     Status: Abnormal   Collection Time: 07/11/23  1:54 PM  Result Value Ref Range   Sodium 132 (L) 135 - 145 mmol/L   Potassium 3.2 (L) 3.5 - 5.1 mmol/L   Chloride 96 (L) 98 - 111 mmol/L   CO2 23 22 - 32 mmol/L   Glucose, Bld 124 (H) 70 - 99 mg/dL    Comment: Glucose reference range applies only to samples taken after fasting for at least 8 hours.   BUN 14 8 - 23 mg/dL   Creatinine, Ser 6.04 0.44 - 1.00 mg/dL   Calcium 9.0 8.9 - 54.0 mg/dL   Total Protein 7.3 6.5 - 8.1 g/dL   Albumin 3.3 (L) 3.5 - 5.0 g/dL   AST 20 15 - 41 U/L   ALT 16 0 - 44 U/L   Alkaline Phosphatase 58 38 - 126 U/L   Total Bilirubin 0.7 0.3 - 1.2 mg/dL   GFR, Estimated >98 >11 mL/min    Comment:  (NOTE) Calculated using the CKD-EPI Creatinine Equation (2021)    Anion gap 13 5 - 15    Comment: Performed at [facility], [address], [place], [place] [address]  CBC with Differential     Status: Abnormal   Collection Time: 07/11/23  1:54 PM  Result Value Ref Range   WBC 10.5 4.0 - 10.5 K/uL   RBC 4.27 3.87 - 5.11 MIL/uL   Hemoglobin 13.1 12.0 - 15.0 g/dL   HCT 29.5 62.1 - 30.8 %   MCV 89.5 80.0 - 100.0 fL   MCH 30.7 26.0 - 34.0 pg  MCHC 34.3 30.0 - 36.0 g/dL   RDW 96.0 45.4 - 09.8 %   Platelets 175 150 - 400 K/uL   nRBC 0.0 0.0 - 0.2 %   Neutrophils Relative % 84 %   Neutro Abs 8.8 (H) 1.7 - 7.7 K/uL   Lymphocytes Relative 9 %   Lymphs Abs 0.9 0.7 - 4.0 K/uL   Monocytes Relative 7 %   Monocytes Absolute 0.7 0.1 - 1.0 K/uL   Eosinophils Relative 0 %   Eosinophils Absolute 0.0 0.0 - 0.5 K/uL   Basophils Relative 0 %   Basophils Absolute 0.0 0.0 - 0.1 K/uL   Immature Granulocytes 0 %   Abs Immature Granulocytes 0.04 0.00 - 0.07 K/uL    Comment: Performed at [facility], [address] [place], [place] [address]  Protime-INR     Status: None   Collection Time: 07/11/23  1:54 PM  Result Value Ref Range   Prothrombin Time 14.2 11.4 - 15.2 seconds   INR 1.1 0.8 - 1.2    Comment: (NOTE) INR goal varies based on device and disease states. Performed at [facility], [address], [place], [place] [address]   APTT     Status: None   Collection Time: 07/11/23  1:54 PM  Result Value Ref Range   aPTT 31 24 - 36 seconds    Comment: Performed at [facility], [address] [place], [place] [address]  Urinalysis, w/ Reflex to Culture (Infection Suspected) -Urine, Clean Catch     Status: Abnormal   Collection Time: 07/11/23  2:48 PM  Result Value Ref Range   Specimen Source URINE, CLEAN CATCH    Color, Urine YELLOW YELLOW   APPearance CLEAR CLEAR   Specific Gravity, Urine 1.006 1.005 - 1.030   pH 6.0 5.0 - 8.0   Glucose, UA NEGATIVE NEGATIVE  mg/dL   Hgb urine dipstick MODERATE (A) NEGATIVE   Bilirubin Urine NEGATIVE NEGATIVE   Ketones, ur NEGATIVE NEGATIVE mg/dL   Protein, ur 30 (A) NEGATIVE mg/dL   Nitrite NEGATIVE NEGATIVE   Leukocytes,Ua LARGE (A) NEGATIVE   RBC / HPF 0-5 0 - 5 RBC/hpf   WBC, UA 11-20 0 - 5 WBC/hpf    Comment:        Reflex urine culture not performed if WBC <=10, OR if Squamous epithelial cells >5. If Squamous epithelial cells >5 suggest recollection.    Bacteria, UA RARE (A) NONE SEEN   Squamous Epithelial / HPF 0-5 0 - 5 /HPF    Comment: Performed at [facility], [address], [place], [place] [address]   CT PELVIS W CONTRAST  Result Date: 07/11/2023 CLINICAL DATA:  Perianal abscess or fistula suspected. Labial abscess. EXAM: CT PELVIS WITH CONTRAST TECHNIQUE: Multidetector CT imaging of the pelvis was performed using the standard protocol following the bolus administration of intravenous contrast. RADIATION DOSE REDUCTION: This exam was performed according to the departmental dose-optimization program which includes automated exposure control, adjustment of the mA and/or kV according to patient size and/or use of iterative reconstruction technique. CONTRAST:  75mL OMNIPAQUE IOHEXOL 350 MG/ML SOLN COMPARISON:  CT abdomen and pelvis 07/18/2013 FINDINGS: Urinary Tract: Normal appearance of the bladder. No wall thickening, stone, or filling defect. Distal ureters are not dilated. Bowel: Visualized portions of small and large bowel are not abnormally distended. No wall thickening or inflammatory changes are seen. Appendix is normal. There is an anastomosis at the rectosigmoid:. Vascular/Lymphatic: Calcification of the  aorta and iliac arteries. No aneurysm. Reproductive: Uterus and ovaries are not enlarged. No abnormal adnexal masses are seen. A vaginal pessary is in place. Other: Diffuse infiltration and edema in the soft tissues of the right side of the labia extending inferiorly into the right perineum  and superiorly into the anterior subcutaneous fat over the pubic symphysis. There is mild skin thickening. Changes are most consistent with cellulitis. No discrete loculated collection is identified. No soft tissue gas is identified. Enlarged lymph nodes are present in the right groin and right pelvis, likely reactive. Musculoskeletal: Degenerative changes in the lower lumbar spine and hips. No destructive bone lesions appreciated. IMPRESSION: 1. Extensive soft tissue swelling and infiltration in the subcutaneous fat involving the right labia and extending into the right perineal region and right groin/symphysis pubis subcutaneous fat. Changes are most consistent with cellulitis. No loculated collection. No soft tissue gas. 2. Aortic atherosclerosis. 3. Prominent right groin and pelvic lymph nodes are likely reactive. Electronically Signed   By: Burman Nieves M.D.   On: 07/11/2023 15:39    Pending Labs Unresulted Labs (From admission, onward)     Start     Ordered   07/12/23 0500  CBC  Tomorrow morning,   R        07/11/23 1645   07/12/23 0500  Basic metabolic panel  Tomorrow morning,   R        07/11/23 1645   07/11/23 1448  Urine Culture  Once,   R        07/11/23 1448   07/11/23 1302  Blood Culture (routine x 2)  (Septic presentation on arrival (screening labs, nursing and treatment orders for obvious sepsis))  BLOOD CULTURE X 2,   STAT      07/11/23 1304            Vitals/Pain Today's Vitals   07/11/23 1608 07/11/23 1700 07/11/23 1747 07/11/23 1803  BP:  (!) 182/96    Pulse: 82 82    Resp: (!) 26 (!) 23    Temp: (!) 97.4 F (36.3 C)   98.3 F (36.8 C)  TempSrc: Oral   Oral  SpO2: 100% 100%    Weight:      Height:      PainSc:   8      Isolation Precautions No active isolations  Medications Medications  lactated ringers infusion ( Intravenous New Bag/Given 07/11/23 1758)  vancomycin (VANCOREADY) IVPB 1250 mg/250 mL (1,250 mg Intravenous New Bag/Given 07/11/23 1800)   HYDROcodone-acetaminophen (NORCO/VICODIN) 5-325 MG per tablet 1 tablet (has no administration in time range)  morphine (PF) 2 MG/ML injection 2 mg (2 mg Intravenous Given 07/11/23 1753)  enoxaparin (LOVENOX) injection 40 mg (has no administration in time range)  sodium chloride flush (NS) 0.9 % injection 3 mL (has no administration in time range)  acetaminophen (TYLENOL) tablet 650 mg (has no administration in time range)    Or  acetaminophen (TYLENOL) suppository 650 mg (has no administration in time range)  albuterol (PROVENTIL) (2.5 MG/3ML) 0.083% nebulizer solution 2.5 mg (has no administration in time range)  hydrALAZINE (APRESOLINE) injection 10 mg (has no administration in time range)  vancomycin (VANCOCIN) IVPB 1000 mg/200 mL premix (has no administration in time range)  ondansetron (ZOFRAN) injection 4 mg (4 mg Intravenous Given 07/11/23 1752)  lactated ringers bolus 1,000 mL (0 mLs Intravenous Stopped 07/11/23 1721)  ceFEPIme (MAXIPIME) 2 g in sodium chloride 0.9 % 100 mL IVPB (0 g Intravenous Stopped 07/11/23 1437)  metroNIDAZOLE (FLAGYL) IVPB 500 mg (0 mg Intravenous Stopped 07/11/23 1721)  iohexol (OMNIPAQUE) 350 MG/ML injection 75 mL (75 mLs Intravenous Contrast Given 07/11/23 1509)  potassium chloride SA (KLOR-CON M) CR tablet 40 mEq (40 mEq Oral Given 07/11/23 1800)    Mobility walks     Focused Assessments    R Recommendations: See Admitting Provider Note  Report given to:   Additional Notes: Patient is A&Ox4, swallowed potassium with no problems, given morphine for pain without nausea or vomiting. Uses bed pan for the restroom, ambulation hurts and purewick would be painful as well.

## 2023-07-12 DIAGNOSIS — R8290 Unspecified abnormal findings in urine: Secondary | ICD-10-CM | POA: Diagnosis not present

## 2023-07-12 DIAGNOSIS — E876 Hypokalemia: Secondary | ICD-10-CM | POA: Diagnosis not present

## 2023-07-12 DIAGNOSIS — L03315 Cellulitis of perineum: Secondary | ICD-10-CM | POA: Diagnosis not present

## 2023-07-12 DIAGNOSIS — L03319 Cellulitis of trunk, unspecified: Secondary | ICD-10-CM | POA: Diagnosis not present

## 2023-07-12 DIAGNOSIS — E785 Hyperlipidemia, unspecified: Secondary | ICD-10-CM | POA: Diagnosis not present

## 2023-07-12 LAB — GLUCOSE, CAPILLARY: Glucose-Capillary: 77 mg/dL (ref 70–99)

## 2023-07-12 NOTE — Progress Notes (Signed)
Transition of Care [facility]) - Inpatient Brief Assessment   Patient Details  Name: Pamela [name] MRN: [id number] Date of Birth: 16-Apr-1946  Transition of Care [facility]) CM/SW Contact:    Janae Bridgeman, RN Phone Number: 07/12/2023, 1:39 PM   Clinical Narrative: Patient admitted to the hospital with cellulitis - abscess of labia - no surgery recommended at this time.  Patient receiving IV antibiotics.  Patient is independent and no TOC needs determined at this time.   Transition of Care Asessment: Insurance and Status: (P) Insurance coverage has been reviewed Patient has primary care physician: (P) Yes Home environment has been reviewed: (P) Yes - from home with spouse Prior level of function:: (P) Independent Prior/Current Home Services: (P) No current home services Social Determinants of Health Reivew: (P) SDOH reviewed no interventions necessary Readmission risk has been reviewed: (P) Yes Transition of care needs: (P) no transition of care needs at this time

## 2023-07-12 NOTE — Progress Notes (Signed)
PROGRESS NOTE  Pamela [name] [id number]:[id number] DOB: Apr 23, 1946   PCP: [name], MD  Patient is from: Home.  DOA: 07/11/2023 LOS: 1  Chief complaints Chief Complaint  Patient presents with   Abcess on Labia     Brief Narrative / Interim history: 77 year old F with PMH of HTN, HLD, hypothyroidism and cystocele presenting with swelling and pain of right labia with associated general malaise, poor p.o. intake, fever, chills and nausea and vomiting.  Initially seen in ED on 8/10 and had bedside ultrasound that showed 2 x 2 cm fluid collection in the inferior portion of right labia majora for which she had bedside I&D with purulent drainage.  Culture was not sent.  She was discharged on p.o. doxycycline.  She had progressive swelling, pain and diaphoresis overnight that prompted her to return to ED.  In ED, stable vitals.  Afebrile.  WBC 10.5 with left shift.  Lactic acid negative.  K3.2.  CT abdomen and pelvis showed extensive soft tissue swelling and infiltration in subcutaneous fat  involving the right labia and extending into right perineal region consistent with cellulitis.  Urinalysis with pyuria.  Dr. Berton Lan with OB/GYN consulted by EDP and did not feel there is need for surgical intervention.  Blood and urine cultures sent.  Started on IV fluid and broad-spectrum antibiotics.     Subjective: Seen and examined earlier this morning.  No major events overnight of this morning.  Feels better today but still with significant tenderness.  No other complaints.  Objective: Vitals:   07/11/23 1803 07/11/23 1950 07/12/23 0415 07/12/23 0757  BP:  (!) 122/55 (!) 108/51 116/70  Pulse:  66 62 64  Resp:  18 18 14   Temp: 98.3 F (36.8 C) 98.3 F (36.8 C) 98.2 F (36.8 C) 97.8 F (36.6 C)  TempSrc: Oral Oral Oral Oral  SpO2:  98% 97% 98%  Weight:   61.2 kg   Height:        Examination:  GENERAL: No apparent distress.  Nontoxic. HEENT: MMM.  Vision and hearing grossly intact.   NECK: Supple.  No apparent JVD.  RESP:  No IWOB.  Fair aeration bilaterally. CVS:  RRR. Heart sounds normal.  ABD/GI/GU: BS+. Abd soft, NTND.  Swelling, induration and erythema of right labia majora and adjacent areas.  Tender to palpation. MSK/EXT:  Moves extremities. No apparent deformity. No edema.  SKIN: As above. NEURO: Awake, alert and oriented appropriately.  No apparent focal neuro deficit. PSYCH: Calm. Normal affect.     Brooks Sailors, RN was present as chaperone during GU exam.    Procedures:  None  Microbiology summarized: COVID-19, influenza and RSV PCR nonreactive Blood cultures NGTD Urine culture pending  Assessment and plan: Principal Problem:   Cellulitis Active Problems:   Abnormal urinalysis   Hypokalemia   Hyperlipidemia   Hypothyroidism  Cellulitis of the right labia with possible abscess: Presents with progressive symptoms and constitutional symptoms despite I&D and p.o. doxycycline outpatient.   No obvious fluid collection on CT but significant erythema, induration and tenderness on exam.  No objective fever or significant leukocytosis yet.  Not diabetic.  Does not smoke.  Culture data as above. -Continue broad-spectrum antibiotics -Reconsulted OB/GYN -Pain control   Abnormal urinalysis/pyuria: Denies UTI symptoms. -Should be covered with broad-spectrum antibiotics regardless -Follow urine culture  Normocytic anemia/thrombocytopenia: Likely dilutional. Recent Labs    08/24/22 0939 07/11/23 1354 07/12/23 0222  HGB 13.8 13.1 10.7*  -Continue monitoring    Hypokalemia -Monitor  replenish as appropriate   Hyperlipidemia -Continue simvastatin   Hypothyroidism: TSH low at 0.29.  No clinical signs of hypo or hyperthyroidism. -Continue levothyroxine. -Repeat TSH in 4 to 6 weeks outpatient    Body mass index is 25.51 kg/m.          DVT prophylaxis:  enoxaparin (LOVENOX) injection 40 mg Start: 07/11/23 2200  Code Status: Full  code Family Communication: None at bedside Level of care: Telemetry Medical Status is: Inpatient Remains inpatient appropriate because: Genital cellulitis   Final disposition: Home Consultants:  OB/GYN  55 minutes with more than 50% spent in reviewing records, counseling patient/family and coordinating care.   Sch Meds:  Scheduled Meds:  dorzolamide-timolol  1 drop Both Eyes BID   enoxaparin (LOVENOX) injection  40 mg Subcutaneous Q24H   levothyroxine  88 mcg Oral Q0600   simvastatin  20 mg Oral q1800   sodium chloride flush  3 mL Intravenous Q12H   Continuous Infusions:  ceFEPime (MAXIPIME) IV Stopped (07/12/23 0231)   vancomycin     PRN Meds:.acetaminophen **OR** acetaminophen, albuterol, hydrALAZINE, HYDROcodone-acetaminophen, morphine injection, ondansetron (ZOFRAN) IV  Antimicrobials: Anti-infectives (From admission, onward)    Start     Dose/Rate Route Frequency Ordered Stop   07/12/23 1800  vancomycin (VANCOCIN) IVPB 1000 mg/200 mL premix        1,000 mg 200 mL/hr over 60 Minutes Intravenous Every 24 hours 07/11/23 1720     07/12/23 0200  ceFEPIme (MAXIPIME) 2 g in sodium chloride 0.9 % 100 mL IVPB        2 g 200 mL/hr over 30 Minutes Intravenous Every 12 hours 07/11/23 1824     07/11/23 1315  vancomycin (VANCOCIN) IVPB 1000 mg/200 mL premix  Status:  Discontinued        1,000 mg 200 mL/hr over 60 Minutes Intravenous  Once 07/11/23 1304 07/11/23 1312   07/11/23 1315  ceFEPIme (MAXIPIME) 2 g in sodium chloride 0.9 % 100 mL IVPB        2 g 200 mL/hr over 30 Minutes Intravenous  Once 07/11/23 1304 07/11/23 1437   07/11/23 1315  metroNIDAZOLE (FLAGYL) IVPB 500 mg        500 mg 100 mL/hr over 60 Minutes Intravenous  Once 07/11/23 1304 07/11/23 1721   07/11/23 1315  vancomycin (VANCOCIN) IVPB 1000 mg/200 mL premix  Status:  Discontinued        1,000 mg 200 mL/hr over 60 Minutes Intravenous  Once 07/11/23 1304 07/11/23 1312   07/11/23 1315  vancomycin (VANCOREADY)  IVPB 1250 mg/250 mL        1,250 mg 166.7 mL/hr over 90 Minutes Intravenous  Once 07/11/23 1312 07/11/23 1930        I have personally reviewed the following labs and images: CBC: Recent Labs  Lab 07/11/23 1354 07/12/23 0222  WBC 10.5 7.9  NEUTROABS 8.8*  --   HGB 13.1 10.7*  HCT 38.2 30.9*  MCV 89.5 89.0  PLT 175 149*   BMP &GFR Recent Labs  Lab 07/11/23 1354 07/12/23 0222  NA 132* 136  K 3.2* 4.0  CL 96* 104  CO2 23 21*  GLUCOSE 124* 93  BUN 14 10  CREATININE 0.79 0.65  CALCIUM 9.0 8.2*   Estimated Creatinine Clearance: 51 mL/min (by C-G formula based on SCr of 0.65 mg/dL). Liver & Pancreas: Recent Labs  Lab 07/11/23 1354  AST 20  ALT 16  ALKPHOS 58  BILITOT 0.7  PROT 7.3  ALBUMIN 3.3*  No results for input(s): "LIPASE", "AMYLASE" in the last 168 hours. No results for input(s): "AMMONIA" in the last 168 hours. Diabetic: No results for input(s): "HGBA1C" in the last 72 hours. Recent Labs  Lab 07/12/23 1149  GLUCAP 77   Cardiac Enzymes: No results for input(s): "CKTOTAL", "CKMB", "CKMBINDEX", "TROPONINI" in the last 168 hours. No results for input(s): "PROBNP" in the last 8760 hours. Coagulation Profile: Recent Labs  Lab 07/11/23 1354  INR 1.1   Thyroid Function Tests: Recent Labs    07/11/23 1354  TSH 0.290*   Lipid Profile: No results for input(s): "CHOL", "HDL", "LDLCALC", "TRIG", "CHOLHDL", "LDLDIRECT" in the last 72 hours. Anemia Panel: No results for input(s): "VITAMINB12", "FOLATE", "FERRITIN", "TIBC", "IRON", "RETICCTPCT" in the last 72 hours. Urine analysis:    Component Value Date/Time   COLORURINE YELLOW 07/11/2023 1448   APPEARANCEUR CLEAR 07/11/2023 1448   LABSPEC 1.006 07/11/2023 1448   PHURINE 6.0 07/11/2023 1448   GLUCOSEU NEGATIVE 07/11/2023 1448   HGBUR MODERATE (A) 07/11/2023 1448   BILIRUBINUR NEGATIVE 07/11/2023 1448   BILIRUBINUR neg 08/27/2022 1230   KETONESUR NEGATIVE 07/11/2023 1448   PROTEINUR 30 (A)  07/11/2023 1448   UROBILINOGEN 0.2 08/27/2022 1230   NITRITE NEGATIVE 07/11/2023 1448   LEUKOCYTESUR LARGE (A) 07/11/2023 1448   Sepsis Labs: Invalid input(s): "PROCALCITONIN", "LACTICIDVEN"  Microbiology: Recent Results (from the past 240 hour(s))  Blood Culture (routine x 2)     Status: None (Preliminary result)   Collection Time: 07/11/23  1:25 PM   Specimen: BLOOD  Result Value Ref Range Status   Specimen Description BLOOD RIGHT ANTECUBITAL  Final   Special Requests   Final    BOTTLES DRAWN AEROBIC AND ANAEROBIC Blood Culture results may not be optimal due to an excessive volume of blood received in culture bottles   Culture   Final    NO GROWTH < 24 HOURS Performed at [facility], [address] [place], [place] [address]    Report Status PENDING  Incomplete  Blood Culture (routine x 2)     Status: None (Preliminary result)   Collection Time: 07/11/23  1:50 PM   Specimen: BLOOD  Result Value Ref Range Status   Specimen Description BLOOD RIGHT ANTECUBITAL  Final   Special Requests   Final    BOTTLES DRAWN AEROBIC AND ANAEROBIC Blood Culture results may not be optimal due to an excessive volume of blood received in culture bottles   Culture   Final    NO GROWTH < 24 HOURS Performed at [facility], [address], [place], [place] [address]    Report Status PENDING  Incomplete  Resp panel by RT-PCR (RSV, Flu A&B, Covid) Anterior Nasal Swab     Status: None   Collection Time: 07/11/23  1:54 PM   Specimen: Anterior Nasal Swab  Result Value Ref Range Status   SARS Coronavirus 2 by RT PCR NEGATIVE NEGATIVE Final   Influenza A by PCR NEGATIVE NEGATIVE Final   Influenza B by PCR NEGATIVE NEGATIVE Final    Comment: (NOTE) The Xpert Xpress SARS-CoV-2/FLU/RSV plus assay is intended as an aid in the diagnosis of influenza from Nasopharyngeal swab specimens and should not be used as a sole basis for treatment. Nasal washings and aspirates are unacceptable for Xpert  Xpress SARS-CoV-2/FLU/RSV testing.  Fact Sheet for Patients: BloggerCourse.com  Fact Sheet for Healthcare Providers: SeriousBroker.it  This test is not yet approved or cleared by the [place] FDA and has been  authorized for detection and/or diagnosis of SARS-CoV-2 by FDA under an Emergency Use Authorization (EUA). This EUA will remain in effect (meaning this test can be used) for the duration of the COVID-19 declaration under Section 564(b)(1) of the Act, 21 U.S.C. section 360bbb-3(b)(1), unless the authorization is terminated or revoked.     Resp Syncytial Virus by PCR NEGATIVE NEGATIVE Final    Comment: (NOTE) Fact Sheet for Patients: BloggerCourse.com  Fact Sheet for Healthcare Providers: SeriousBroker.it  This test is not yet approved or cleared by the [place] FDA and has been authorized for detection and/or diagnosis of SARS-CoV-2 by FDA under an Emergency Use Authorization (EUA). This EUA will remain in effect (meaning this test can be used) for the duration of the COVID-19 declaration under Section 564(b)(1) of the Act, 21 U.S.C. section 360bbb-3(b)(1), unless the authorization is terminated or revoked.  Performed at [facility], [address] [place], [place] [address]   Urine Culture     Status: None (Preliminary result)   Collection Time: 07/11/23  2:48 PM   Specimen: Urine, Clean Catch  Result Value Ref Range Status   Specimen Description URINE, CLEAN CATCH  Final   Special Requests   Final    NONE Reflexed from [id number] Performed at [facility], [address], [place], [place] [address]    Culture PENDING  Incomplete   Report Status PENDING  Incomplete    Radiology Studies: CT PELVIS W CONTRAST  Result Date: 07/11/2023 CLINICAL DATA:  Perianal abscess or fistula suspected. Labial abscess. EXAM: CT PELVIS WITH CONTRAST TECHNIQUE:  Multidetector CT imaging of the pelvis was performed using the standard protocol following the bolus administration of intravenous contrast. RADIATION DOSE REDUCTION: This exam was performed according to the departmental dose-optimization program which includes automated exposure control, adjustment of the mA and/or kV according to patient size and/or use of iterative reconstruction technique. CONTRAST:  75mL OMNIPAQUE IOHEXOL 350 MG/ML SOLN COMPARISON:  CT abdomen and pelvis 07/18/2013 FINDINGS: Urinary Tract: Normal appearance of the bladder. No wall thickening, stone, or filling defect. Distal ureters are not dilated. Bowel: Visualized portions of small and large bowel are not abnormally distended. No wall thickening or inflammatory changes are seen. Appendix is normal. There is an anastomosis at the rectosigmoid:. Vascular/Lymphatic: Calcification of the aorta and iliac arteries. No aneurysm. Reproductive: Uterus and ovaries are not enlarged. No abnormal adnexal masses are seen. A vaginal pessary is in place. Other: Diffuse infiltration and edema in the soft tissues of the right side of the labia extending inferiorly into the right perineum and superiorly into the anterior subcutaneous fat over the pubic symphysis. There is mild skin thickening. Changes are most consistent with cellulitis. No discrete loculated collection is identified. No soft tissue gas is identified. Enlarged lymph nodes are present in the right groin and right pelvis, likely reactive. Musculoskeletal: Degenerative changes in the lower lumbar spine and hips. No destructive bone lesions appreciated. IMPRESSION: 1. Extensive soft tissue swelling and infiltration in the subcutaneous fat involving the right labia and extending into the right perineal region and right groin/symphysis pubis subcutaneous fat. Changes are most consistent with cellulitis. No loculated collection. No soft tissue gas. 2. Aortic atherosclerosis. 3. Prominent right groin  and pelvic lymph nodes are likely reactive. Electronically Signed   By: Burman Nieves M.D.   On: 07/11/2023 15:39      Tafari Humiston T. Julio Zappia [facility]  If 7PM-7AM, please contact night-coverage [email or web address] 07/12/2023, 12:34 PM

## 2023-07-12 NOTE — Consult Note (Signed)
OBSTETRICS AND GYNECOLOGY ATTENDING CONSULT NOTE  Consult Date: 07/12/2023 Reason for Consult: Vulvar cellulitis, assessment for possible I&D Consulting Provider: Dr. Candelaria Stagers   Assessment/Plan: - No evidence of abscess or necrotizing fasciitis so no surgical debridement or incision & drainage currently indicated - Consider obtaining an updated HbA1c (none available on my chart reviewed) - If patient's symptoms are refractory to IV antibiotics or her clinical picture worsens, please contact our team for re-evaluation and possible vulvar biopsy - Patient can follow up with her outpatient gynecologist Dr. Hyacinth Meeker 1-2 weeks after discharge. I've routed a message to the office for coordination of care.   Appreciate care of HEBAH [name] by her primary team   If there are any additional questions or concerns, please call [phone] [facility] OB/[facility]N Consult Attending Monday-Friday 8am - 5pm) or [phone] [facility] OB[facility]N Attending On Call all day, every day). Thank you for involving [place] in the care of this patient.  Total consultation time including face-to-face time with patient (>50% of time), reviewing chart and documentation: 30 minutes  Harvie Bridge, MD Obstetrician & Gynecologist, [facility] for [facility], [facility] Consult Phone: [phone]  History of Present Illness: Pamela [name] is an 77 y.o. G52P0030 female who was admitted for vulvar cellulitis.   Pt reports acute onset of vulvar pain, warmth, redness and swelling 5 nights ago. Tried warm compresses without relief. Associated chills, night sweats, decreased appetite. Was seen in ED for her symptoms 3 days ago. US revealed a 2x2 pocket in the right vulva that was amenable to drainage. Pt underwent I&D and was discharged home with doxycycline. She returned to the ED 2 days later with worsening swelling and pain. She was afebrile, HDS without leukocytosis. CT pelvis w/ diffuse infiltration and  edema in the R labia extending into the perineum and mons pubis consistent with cellulitis. No discrete fluid collection or soft tissue gas. Enlarged lymph nodes present in the R groin and pelvic suspected to be reactive. She was admitted for IV antibiotics (vanc/cefepime/flaguyl) and further management.   This afternoon, the patient reports her symptoms are improving. Redness is receding, pain is controlled. She was able to tolerate breakfast without any nausea/vomiting.   Pt has history of pelvic organ prolapse and has a pessary in place.  Pertinent OB/GYN History: No LMP recorded. Patient is postmenopausal. OB History  Gravida Para Term Preterm AB Living  3 0 0 0 3 0  SAB IAB Ectopic Multiple Live Births  0 0 3 0 0    # Outcome Date GA Lbr Len/2nd Weight Sex Type Anes PTL Lv  3 Ectopic           2 Ectopic           1 Ectopic            Patient Active Problem List   Diagnosis Date Noted   Cellulitis 07/11/2023   Abnormal urinalysis 07/11/2023   Hypokalemia 07/11/2023   Female cystocele 03/06/2023   Vaginal atrophy 03/06/2023   Incomplete emptying of bladder 03/06/2023   Family history of cardiac disorder 03/27/2014   Hypertension    Diverticulitis of sigmoid colon s/p robotic colectomy 09/04/2013 08/14/2013   Hypothyroidism 06/26/2011   Osteopenia 06/26/2011   Fibrocystic breast disease 06/26/2011   Hyperlipidemia 06/26/2011   Insomnia 06/26/2011    Past Medical History:  Diagnosis Date   Colon polyps    Complication of anesthesia 1980's   laparoscopic procedure invitro, bradycardia and heart stopped  at [facility]   Diverticulitis    Diverticulosis    Fibrocystic breast disease    Hyperlipidemia    Hypertension    elevated bp in past, no current meds   Hypothyroidism    Osteopenia    Tubal ectopic pregnancy    x 3   Vaginal atrophy    Vitamin D deficiency     Past Surgical History:  Procedure Laterality Date   BREAST CYST ASPIRATION  2003   ECTOPIC PREGNANCY  SURGERY  1980s   x3   FERTILITY SURGERY     x4 IVF   PARTIAL COLECTOMY     recurrent diverticulitis   tubal repair  1980's   WRIST FRACTURE SURGERY Left 1987    Family History  Problem Relation Age of Onset   Colon cancer Paternal Uncle    Cancer Paternal Uncle        Colon   Bladder Cancer Paternal Uncle    Cancer Paternal Uncle        Prostate/Bladder   Diabetes Father    Heart disease Father    Stroke Father    Hypertension Mother    Cancer Mother        Lung   Prostate cancer Paternal Uncle    Skin cancer Paternal Uncle    Diabetes Other        father's side juvenile and adult onset   Cancer Paternal Grandfather        Skin/Stomach    Social History:  reports that she has never smoked. She has never used smokeless tobacco. She reports current alcohol use. She reports that she does not use drugs.  Allergies:  Allergies  Allergen Reactions   Succinylcholine Other (See Comments)    BRADYCARDIA/  ASYSTOLE 1980's   Boniva [Ibandronate Sodium]     GI   Morphine And Codeine Nausea And Vomiting   Penicillins Hives    Medications: I have reviewed the patient's current medications.  Review of Systems: Pertinent items are noted in HPI.  Focused Physical Examination: BP 122/71 (BP Location: Right Arm)   Pulse 61   Temp 97.9 F (36.6 C) (Oral)   Resp 17   Ht 5\' 1"  (1.549 m)   Wt 61.2 kg   SpO2 97%   BMI 25.51 kg/m  CONSTITUTIONAL: Well-developed, well-nourished female in no acute distress.  CARDIOVASCULAR: Normal heart rate noted RESPIRATORY: Effort normal, no problems with respiration noted. ABDOMEN: Soft, non tender.  PELVIC: Diffuse erythema, warmth, tenderness and edema involving the right labia, perineum and mons. No crepitus. No fluctuance or palpable fluid collection. Done in the presence of a chaperone.  Labs and Imaging: Results for orders placed or performed during the hospital encounter of 07/11/23 (from the past 72 hour(s))  Blood Culture  (routine x 2)     Status: None (Preliminary result)   Collection Time: 07/11/23  1:25 PM   Specimen: BLOOD  Result Value Ref Range   Specimen Description BLOOD RIGHT ANTECUBITAL    Special Requests      BOTTLES DRAWN AEROBIC AND ANAEROBIC Blood Culture results may not be optimal due to an excessive volume of blood received in culture bottles   Culture      NO GROWTH < 24 HOURS Performed at [facility], [address], [place], [place] [address]    Report Status PENDING   I-Stat Lactic Acid, ED     Status: None   Collection Time: 07/11/23  1:44 PM  Result Value Ref Range  Lactic Acid, Venous 1.6 0.5 - 1.9 mmol/L  Blood Culture (routine x 2)     Status: None (Preliminary result)   Collection Time: 07/11/23  1:50 PM   Specimen: BLOOD  Result Value Ref Range   Specimen Description BLOOD RIGHT ANTECUBITAL    Special Requests      BOTTLES DRAWN AEROBIC AND ANAEROBIC Blood Culture results may not be optimal due to an excessive volume of blood received in culture bottles   Culture      NO GROWTH < 24 HOURS Performed at [facility], [address], [place], [place] [address]    Report Status PENDING   Resp panel by RT-PCR (RSV, Flu A&B, Covid) Anterior Nasal Swab     Status: None   Collection Time: 07/11/23  1:54 PM   Specimen: Anterior Nasal Swab  Result Value Ref Range   SARS Coronavirus 2 by RT PCR NEGATIVE NEGATIVE   Influenza A by PCR NEGATIVE NEGATIVE   Influenza B by PCR NEGATIVE NEGATIVE    Comment: (NOTE) The Xpert Xpress SARS-CoV-2/FLU/RSV plus assay is intended as an aid in the diagnosis of influenza from Nasopharyngeal swab specimens and should not be used as a sole basis for treatment. Nasal washings and aspirates are unacceptable for Xpert Xpress SARS-CoV-2/FLU/RSV testing.  Fact Sheet for Patients: BloggerCourse.com  Fact Sheet for Healthcare Providers: SeriousBroker.it  This test is not yet approved  or cleared by the [place] FDA and has been authorized for detection and/or diagnosis of SARS-CoV-2 by FDA under an Emergency Use Authorization (EUA). This EUA will remain in effect (meaning this test can be used) for the duration of the COVID-19 declaration under Section 564(b)(1) of the Act, 21 U.S.C. section 360bbb-3(b)(1), unless the authorization is terminated or revoked.     Resp Syncytial Virus by PCR NEGATIVE NEGATIVE    Comment: (NOTE) Fact Sheet for Patients: BloggerCourse.com  Fact Sheet for Healthcare Providers: SeriousBroker.it  This test is not yet approved or cleared by the [place] FDA and has been authorized for detection and/or diagnosis of SARS-CoV-2 by FDA under an Emergency Use Authorization (EUA). This EUA will remain in effect (meaning this test can be used) for the duration of the COVID-19 declaration under Section 564(b)(1) of the Act, 21 U.S.C. section 360bbb-3(b)(1), unless the authorization is terminated or revoked.  Performed at [facility], [address], [place], [place] [address]   Comprehensive metabolic panel     Status: Abnormal   Collection Time: 07/11/23  1:54 PM  Result Value Ref Range   Sodium 132 (L) 135 - 145 mmol/L   Potassium 3.2 (L) 3.5 - 5.1 mmol/L   Chloride 96 (L) 98 - 111 mmol/L   CO2 23 22 - 32 mmol/L   Glucose, Bld 124 (H) 70 - 99 mg/dL    Comment: Glucose reference range applies only to samples taken after fasting for at least 8 hours.   BUN 14 8 - 23 mg/dL   Creatinine, Ser 0.98 0.44 - 1.00 mg/dL   Calcium 9.0 8.9 - 11.9 mg/dL   Total Protein 7.3 6.5 - 8.1 g/dL   Albumin 3.3 (L) 3.5 - 5.0 g/dL   AST 20 15 - 41 U/L   ALT 16 0 - 44 U/L   Alkaline Phosphatase 58 38 - 126 U/L   Total Bilirubin 0.7 0.3 - 1.2 mg/dL   GFR, Estimated >14 >78 mL/min    Comment: (NOTE) Calculated using the CKD-EPI Creatinine Equation (2021)  Anion gap 13 5 - 15    Comment:  Performed at [facility], [address] [place], [place] [address]  CBC with Differential     Status: Abnormal   Collection Time: 07/11/23  1:54 PM  Result Value Ref Range   WBC 10.5 4.0 - 10.5 K/uL   RBC 4.27 3.87 - 5.11 MIL/uL   Hemoglobin 13.1 12.0 - 15.0 g/dL   HCT 34.7 42.5 - 95.6 %   MCV 89.5 80.0 - 100.0 fL   MCH 30.7 26.0 - 34.0 pg   MCHC 34.3 30.0 - 36.0 g/dL   RDW 38.7 56.4 - 33.2 %   Platelets 175 150 - 400 K/uL   nRBC 0.0 0.0 - 0.2 %   Neutrophils Relative % 84 %   Neutro Abs 8.8 (H) 1.7 - 7.7 K/uL   Lymphocytes Relative 9 %   Lymphs Abs 0.9 0.7 - 4.0 K/uL   Monocytes Relative 7 %   Monocytes Absolute 0.7 0.1 - 1.0 K/uL   Eosinophils Relative 0 %   Eosinophils Absolute 0.0 0.0 - 0.5 K/uL   Basophils Relative 0 %   Basophils Absolute 0.0 0.0 - 0.1 K/uL   Immature Granulocytes 0 %   Abs Immature Granulocytes 0.04 0.00 - 0.07 K/uL    Comment: Performed at [facility], [address], [place], [place] [address]  Protime-INR     Status: None   Collection Time: 07/11/23  1:54 PM  Result Value Ref Range   Prothrombin Time 14.2 11.4 - 15.2 seconds   INR 1.1 0.8 - 1.2    Comment: (NOTE) INR goal varies based on device and disease states. Performed at [facility], [address], [place], [place] [address]   APTT     Status: None   Collection Time: 07/11/23  1:54 PM  Result Value Ref Range   aPTT 31 24 - 36 seconds    Comment: Performed at [facility], [address], [place], [place] [address]  TSH     Status: Abnormal   Collection Time: 07/11/23  1:54 PM  Result Value Ref Range   TSH 0.290 (L) 0.350 - 4.500 uIU/mL    Comment: Performed by a 3rd Generation assay with a functional sensitivity of <=0.01 uIU/mL. Performed at [facility], [address] [place], [place] [address]   Urinalysis, w/ Reflex to Culture (Infection Suspected) -Urine, Clean Catch     Status: Abnormal   Collection Time: 07/11/23  2:48 PM  Result Value Ref  Range   Specimen Source URINE, CLEAN CATCH    Color, Urine YELLOW YELLOW   APPearance CLEAR CLEAR   Specific Gravity, Urine 1.006 1.005 - 1.030   pH 6.0 5.0 - 8.0   Glucose, UA NEGATIVE NEGATIVE mg/dL   Hgb urine dipstick MODERATE (A) NEGATIVE   Bilirubin Urine NEGATIVE NEGATIVE   Ketones, ur NEGATIVE NEGATIVE mg/dL   Protein, ur 30 (A) NEGATIVE mg/dL   Nitrite NEGATIVE NEGATIVE   Leukocytes,Ua LARGE (A) NEGATIVE   RBC / HPF 0-5 0 - 5 RBC/hpf   WBC, UA 11-20 0 - 5 WBC/hpf    Comment:        Reflex urine culture not performed if WBC <=10, OR if Squamous epithelial cells >5. If Squamous epithelial cells >5 suggest recollection.    Bacteria, UA RARE (A) NONE SEEN   Squamous Epithelial / HPF 0-5 0 - 5 /HPF    Comment: Performed at [facility], [address], [place], [place] [address]  Urine Culture     Status: Abnormal   Collection Time: 07/11/23  2:48 PM   Specimen: Urine, Clean Catch  Result Value Ref Range   Specimen Description URINE, CLEAN CATCH    Special Requests      NONE Reflexed from [id number] Performed at [facility], [address], [place], [place] [address]    Culture MULTIPLE SPECIES PRESENT, SUGGEST RECOLLECTION (A)    Report Status 07/12/2023 FINAL   CBC     Status: Abnormal   Collection Time: 07/12/23  2:22 AM  Result Value Ref Range   WBC 7.9 4.0 - 10.5 K/uL   RBC 3.47 (L) 3.87 - 5.11 MIL/uL   Hemoglobin 10.7 (L) 12.0 - 15.0 g/dL   HCT 09.8 (L) 11.9 - 14.7 %   MCV 89.0 80.0 - 100.0 fL   MCH 30.8 26.0 - 34.0 pg   MCHC 34.6 30.0 - 36.0 g/dL   RDW 82.9 56.2 - 13.0 %   Platelets 149 (L) 150 - 400 K/uL   nRBC 0.0 0.0 - 0.2 %    Comment: Performed at [facility], [address], [place], [place] [address]  Basic metabolic panel     Status: Abnormal   Collection Time: 07/12/23  2:22 AM  Result Value Ref Range   Sodium 136 135 - 145 mmol/L   Potassium 4.0 3.5 - 5.1 mmol/L   Chloride 104 98 - 111 mmol/L   CO2 21 (L) 22 - 32 mmol/L    Glucose, Bld 93 70 - 99 mg/dL    Comment: Glucose reference range applies only to samples taken after fasting for at least 8 hours.   BUN 10 8 - 23 mg/dL   Creatinine, Ser 4.69 0.44 - 1.00 mg/dL   Calcium 8.2 (L) 8.9 - 10.3 mg/dL   GFR, Estimated >62 >95 mL/min    Comment: (NOTE) Calculated using the CKD-EPI Creatinine Equation (2021)    Anion gap 11 5 - 15    Comment: Performed at [facility], [address] [place], [place] [address]  Glucose, capillary     Status: None   Collection Time: 07/12/23 11:49 AM  Result Value Ref Range   Glucose-Capillary 77 70 - 99 mg/dL    Comment: Glucose reference range applies only to samples taken after fasting for at least 8 hours.    CT PELVIS W CONTRAST  Result Date: 07/11/2023 CLINICAL DATA:  Perianal abscess or fistula suspected. Labial abscess. EXAM: CT PELVIS WITH CONTRAST TECHNIQUE: Multidetector CT imaging of the pelvis was performed using the standard protocol following the bolus administration of intravenous contrast. RADIATION DOSE REDUCTION: This exam was performed according to the departmental dose-optimization program which includes automated exposure control, adjustment of the mA and/or kV according to patient size and/or use of iterative reconstruction technique. CONTRAST:  75mL OMNIPAQUE IOHEXOL 350 MG/ML SOLN COMPARISON:  CT abdomen and pelvis 07/18/2013 FINDINGS: Urinary Tract: Normal appearance of the bladder. No wall thickening, stone, or filling defect. Distal ureters are not dilated. Bowel: Visualized portions of small and large bowel are not abnormally distended. No wall thickening or inflammatory changes are seen. Appendix is normal. There is an anastomosis at the rectosigmoid:. Vascular/Lymphatic: Calcification of the aorta and iliac arteries. No aneurysm. Reproductive: Uterus and ovaries are not enlarged. No abnormal adnexal masses are seen. A vaginal pessary is in place. Other: Diffuse infiltration and edema in the soft  tissues  of the right side of the labia extending inferiorly into the right perineum and superiorly into the anterior subcutaneous fat over the pubic symphysis. There is mild skin thickening. Changes are most consistent with cellulitis. No discrete loculated collection is identified. No soft tissue gas is identified. Enlarged lymph nodes are present in the right groin and right pelvis, likely reactive. Musculoskeletal: Degenerative changes in the lower lumbar spine and hips. No destructive bone lesions appreciated. IMPRESSION: 1. Extensive soft tissue swelling and infiltration in the subcutaneous fat involving the right labia and extending into the right perineal region and right groin/symphysis pubis subcutaneous fat. Changes are most consistent with cellulitis. No loculated collection. No soft tissue gas. 2. Aortic atherosclerosis. 3. Prominent right groin and pelvic lymph nodes are likely reactive. Electronically Signed   By: Burman Nieves M.D.   On: 07/11/2023 15:39

## 2023-07-12 NOTE — Plan of Care (Signed)
  Problem: Clinical Measurements: Goal: Ability to avoid or minimize complications of infection will improve Outcome: Progressing   Problem: Skin Integrity: Goal: Skin integrity will improve Outcome: Progressing   Problem: Education: Goal: Knowledge of General Education information will improve Description: Including pain rating scale, medication(s)/side effects and non-pharmacologic comfort measures Outcome: Progressing   Problem: Health Behavior/Discharge Planning: Goal: Ability to manage health-related needs will improve Outcome: Progressing   Problem: Clinical Measurements: Goal: Ability to maintain clinical measurements within normal limits will improve Outcome: Progressing Goal: Will remain free from infection Outcome: Progressing Goal: Diagnostic test results will improve Outcome: Progressing Goal: Respiratory complications will improve Outcome: Progressing Goal: Cardiovascular complication will be avoided Outcome: Progressing   Problem: Activity: Goal: Risk for activity intolerance will decrease Outcome: Progressing   Problem: Nutrition: Goal: Adequate nutrition will be maintained Outcome: Progressing   Problem: Coping: Goal: Level of anxiety will decrease Outcome: Progressing   Problem: Elimination: Goal: Will not experience complications related to bowel motility Outcome: Progressing Goal: Will not experience complications related to urinary retention Outcome: Progressing

## 2023-07-13 DIAGNOSIS — E876 Hypokalemia: Secondary | ICD-10-CM | POA: Diagnosis not present

## 2023-07-13 DIAGNOSIS — R8290 Unspecified abnormal findings in urine: Secondary | ICD-10-CM | POA: Diagnosis not present

## 2023-07-13 DIAGNOSIS — L03319 Cellulitis of trunk, unspecified: Secondary | ICD-10-CM | POA: Diagnosis not present

## 2023-07-13 DIAGNOSIS — E785 Hyperlipidemia, unspecified: Secondary | ICD-10-CM | POA: Diagnosis not present

## 2023-07-13 LAB — CBC
HCT: 32.6 % — ABNORMAL LOW (ref 36.0–46.0)
Hemoglobin: 11.2 g/dL — ABNORMAL LOW (ref 12.0–15.0)
MCH: 30.8 pg (ref 26.0–34.0)
MCHC: 34.4 g/dL (ref 30.0–36.0)
MCV: 89.6 fL (ref 80.0–100.0)
Platelets: 192 10*3/uL (ref 150–400)
RBC: 3.64 MIL/uL — ABNORMAL LOW (ref 3.87–5.11)
RDW: 13.1 % (ref 11.5–15.5)
WBC: 8.1 10*3/uL (ref 4.0–10.5)
nRBC: 0 % (ref 0.0–0.2)

## 2023-07-13 LAB — RENAL FUNCTION PANEL
Albumin: 2.5 g/dL — ABNORMAL LOW (ref 3.5–5.0)
Anion gap: 9 (ref 5–15)
BUN: 6 mg/dL — ABNORMAL LOW (ref 8–23)
CO2: 23 mmol/L (ref 22–32)
Calcium: 8.5 mg/dL — ABNORMAL LOW (ref 8.9–10.3)
Chloride: 106 mmol/L (ref 98–111)
Creatinine, Ser: 0.76 mg/dL (ref 0.44–1.00)
GFR, Estimated: >60 mL/min (ref >60)
Glucose, Bld: 102 mg/dL — ABNORMAL HIGH (ref 70–99)
Phosphorus: 1.8 mg/dL — ABNORMAL LOW (ref 2.5–4.6)
Potassium: 3.6 mmol/L (ref 3.5–5.1)
Sodium: 138 mmol/L (ref 135–145)

## 2023-07-13 LAB — HEMOGLOBIN A1C
Hgb A1c MFr Bld: 5.8 % — ABNORMAL HIGH (ref 4.8–5.6)
Mean Plasma Glucose: 119.76 mg/dL

## 2023-07-13 LAB — MAGNESIUM: Magnesium: 1.7 mg/dL (ref 1.7–2.4)

## 2023-07-13 MED ORDER — POTASSIUM PHOSPHATES 15 MMOLE/5ML IV SOLN
30.0000 mmol | Freq: Once | INTRAVENOUS | Status: AC
  Administered 2023-07-13: 30 mmol via INTRAVENOUS
  Filled 2023-07-13: qty 10

## 2023-07-13 MED ORDER — METRONIDAZOLE 500 MG/100ML IV SOLN
500.0000 mg | Freq: Two times a day (BID) | INTRAVENOUS | Status: DC
  Administered 2023-07-13 – 2023-07-16 (×7): 500 mg via INTRAVENOUS
  Filled 2023-07-13 (×7): qty 100

## 2023-07-13 MED ORDER — MAGNESIUM SULFATE 2 GM/50ML IV SOLN
2.0000 g | Freq: Once | INTRAVENOUS | Status: AC
  Administered 2023-07-13: 2 g via INTRAVENOUS
  Filled 2023-07-13: qty 50

## 2023-07-13 MED ORDER — KETOROLAC TROMETHAMINE 15 MG/ML IJ SOLN: 15.0000 mg | Freq: Four times a day (QID) | INTRAMUSCULAR | Status: DC | PRN

## 2023-07-13 MED ORDER — HYDROMORPHONE HCL 1 MG/ML IJ SOLN
0.5000 mg | INTRAMUSCULAR | Status: DC | PRN
  Administered 2023-07-13: 0.5 mg via INTRAVENOUS
  Filled 2023-07-13: qty 0.5

## 2023-07-13 NOTE — Progress Notes (Signed)
Subjective:f/u right labial cellulitis s/p I&D Patient reports tolerating PO and no problems voiding.   Feels that the labia are more uncomfortable and swollen. Dr. Alanda Slim requested re-evaluation Objective: I have reviewed patient's vital signs, medications, microbiology, and radiology results. Blood pressure 124/62, pulse 61, temperature 98.3 F (36.8 C), resp. rate 18, height 5\' 1"  (1.549 m), weight 61.2 kg, SpO2 97%.  General: alert, cooperative, and no distress Resp: effort normal Cardio: regular rate and rhythm Right labium and mons erythema and swelling no fluctuance or mass, slightly tender, c/w cellulitis   Assessment/Plan: Continue IV antibiotics. Blood culture no growth so far, afebrile and WBC 8.1. Will continue to follow  LOS: 2 days    Scheryl Darter, MD 07/13/2023, 2:04 PM

## 2023-07-13 NOTE — Progress Notes (Signed)
PROGRESS NOTE  Pamela [name] NGE:[id number] DOB: [birth year]   PCP: [name], MD  Patient is from: Home.  DOA: 07/11/2023 LOS: 2  Chief complaints Chief Complaint  Patient presents with   Abcess on Labia     Brief Narrative / Interim history: 77 year old F with PMH of HTN, HLD, hypothyroidism and cystocele presenting with swelling and pain of right labia with associated general malaise, poor p.o. intake, fever, chills and nausea and vomiting.  Initially seen in ED on 8/10 and had bedside ultrasound that showed 2 x 2 cm fluid collection in the inferior portion of right labia majora for which she had bedside I&D with purulent drainage.  Culture was not sent.  She was discharged on p.o. doxycycline.  She had progressive swelling, pain and diaphoresis overnight that prompted her to return to ED.  In ED, stable vitals.  Afebrile.  WBC 10.5 with left shift.  Lactic acid negative.  K3.2.  CT abdomen and pelvis showed extensive soft tissue swelling and infiltration in subcutaneous fat  involving the right labia and extending into right perineal region consistent with cellulitis.  Urinalysis with pyuria. .  Blood and urine cultures sent.  Started on IV fluid and broad-spectrum antibiotics.  Gynecology following.    Subjective: Seen and examined earlier this morning.  Felt bad last night and this morning.  Feels like the swelling and inflammation is spreading.  Pain is worse.   Objective: Vitals:   07/13/23 0421 07/13/23 0700 07/13/23 0732 07/13/23 1446  BP: (!) 137/49 124/62 124/62 115/73  Pulse: 63 61 61 (!) 51  Resp: 18 18 18 18   Temp: 98.2 F (36.8 C) 98.3 F (36.8 C) 98.3 F (36.8 C) 97.8 F (36.6 C)  TempSrc: Oral  Oral Oral  SpO2: 96% 97% 97% 98%  Weight:      Height:        Examination:  GENERAL: No apparent distress.  Nontoxic. HEENT: MMM.  Vision and hearing grossly intact.  NECK: Supple.  No apparent JVD.  RESP:  No IWOB.  Fair aeration bilaterally. CVS:  RRR.  Heart sounds normal.  ABD/GI/GU: BS+. Abd soft, NTND.  Swelling, induration and erythema of right labia majora and adjacent areas.  TTP. MSK/EXT:  Moves extremities. No apparent deformity. No edema.  SKIN: As above. NEURO: Awake, alert and oriented appropriately.  No apparent focal neuro deficit. PSYCH: Calm. Normal affect.   Francis Gaines, RN present as chaperone during GU exam.  Procedures:  None  Microbiology summarized: COVID-19, influenza and RSV PCR nonreactive Blood cultures NGTD Urine culture with multiple species.  Assessment and plan: Principal Problem:   Cellulitis Active Problems:   Abnormal urinalysis   Hypokalemia   Hyperlipidemia   Hypothyroidism  Cellulitis of the right labia with possible abscess: Presents with progressive symptoms and constitutional symptoms despite I&D and p.o. doxycycline outpatient.   No obvious fluid collection on CT but significant erythema, induration and tenderness on exam.  No objective fever or significant leukocytosis yet.  Not diabetic.  A1c 5.8%.  Does not smoke.  Culture data as above. -Continue broad-spectrum antibiotics -GYN following-recommended antibiotics. -Pain control-added ketorolac and changing morphine to Dilaudid for better pain control   Abnormal urinalysis/pyuria: Denies UTI symptoms.  Urine culture with multiple species.  Normocytic anemia/thrombocytopenia: Likely dilutional. Recent Labs    08/24/22 0939 07/11/23 1354 07/12/23 0222 07/13/23 1010  HGB 13.8 13.1 10.7* 11.2*  -Continue monitoring    Hypokalemia/hypophosphatemia/hypomagnesemia -Monitor replenish as appropriate   Hyperlipidemia -Continue simvastatin  Hypothyroidism: TSH low at 0.29.  No clinical signs of hypo or hyperthyroidism. -Continue levothyroxine. -Repeat TSH in 4 to 6 weeks outpatient    Body mass index is 25.51 kg/m.          DVT prophylaxis:  enoxaparin (LOVENOX) injection 40 mg Start: 07/11/23 2200  Code Status: Full  code Family Communication: None at bedside Level of care: Telemetry Medical Status is: Inpatient Remains inpatient appropriate because: Genital cellulitis with possible abscess   Final disposition: Home Consultants:  OB/GYN  55 minutes with more than 50% spent in reviewing records, counseling patient/family and coordinating care.   Sch Meds:  Scheduled Meds:  dorzolamide-timolol  1 drop Both Eyes BID   enoxaparin (LOVENOX) injection  40 mg Subcutaneous Q24H   levothyroxine  88 mcg Oral Q0600   simvastatin  20 mg Oral q1800   sodium chloride flush  3 mL Intravenous Q12H   Continuous Infusions:  ceFEPime (MAXIPIME) IV 2 g (07/13/23 1429)   metronidazole 500 mg (07/13/23 1006)   potassium PHOSPHATE IVPB (in mmol) 30 mmol (07/13/23 1559)   vancomycin 1,000 mg (07/12/23 1721)   PRN Meds:.acetaminophen **OR** acetaminophen, albuterol, hydrALAZINE, HYDROcodone-acetaminophen, HYDROmorphone (DILAUDID) injection, ketorolac, ondansetron (ZOFRAN) IV  Antimicrobials: Anti-infectives (From admission, onward)    Start     Dose/Rate Route Frequency Ordered Stop   07/13/23 1100  metroNIDAZOLE (FLAGYL) IVPB 500 mg        500 mg 100 mL/hr over 60 Minutes Intravenous Every 12 hours 07/13/23 0949     07/12/23 1800  vancomycin (VANCOCIN) IVPB 1000 mg/200 mL premix        1,000 mg 200 mL/hr over 60 Minutes Intravenous Every 24 hours 07/11/23 1720     07/12/23 0200  ceFEPIme (MAXIPIME) 2 g in sodium chloride 0.9 % 100 mL IVPB        2 g 200 mL/hr over 30 Minutes Intravenous Every 12 hours 07/11/23 1824     07/11/23 1315  vancomycin (VANCOCIN) IVPB 1000 mg/200 mL premix  Status:  Discontinued        1,000 mg 200 mL/hr over 60 Minutes Intravenous  Once 07/11/23 1304 07/11/23 1312   07/11/23 1315  ceFEPIme (MAXIPIME) 2 g in sodium chloride 0.9 % 100 mL IVPB        2 g 200 mL/hr over 30 Minutes Intravenous  Once 07/11/23 1304 07/11/23 1437   07/11/23 1315  metroNIDAZOLE (FLAGYL) IVPB 500 mg         500 mg 100 mL/hr over 60 Minutes Intravenous  Once 07/11/23 1304 07/11/23 1721   07/11/23 1315  vancomycin (VANCOCIN) IVPB 1000 mg/200 mL premix  Status:  Discontinued        1,000 mg 200 mL/hr over 60 Minutes Intravenous  Once 07/11/23 1304 07/11/23 1312   07/11/23 1315  vancomycin (VANCOREADY) IVPB 1250 mg/250 mL        1,250 mg 166.7 mL/hr over 90 Minutes Intravenous  Once 07/11/23 1312 07/11/23 1930        I have personally reviewed the following labs and images: CBC: Recent Labs  Lab 07/11/23 1354 07/12/23 0222 07/13/23 1010  WBC 10.5 7.9 8.1  NEUTROABS 8.8*  --   --   HGB 13.1 10.7* 11.2*  HCT 38.2 30.9* 32.6*  MCV 89.5 89.0 89.6  PLT 175 149* 192   BMP &GFR Recent Labs  Lab 07/11/23 1354 07/12/23 0222 07/13/23 1010  NA 132* 136 138  K 3.2* 4.0 3.6  CL 96* 104 106  CO2  23 21* 23  GLUCOSE 124* 93 102*  BUN 14 10 6*  CREATININE 0.79 0.65 0.76  CALCIUM 9.0 8.2* 8.5*  MG  --   --  1.7  PHOS  --   --  1.8*   Estimated Creatinine Clearance: 51 mL/min (by C-G formula based on SCr of 0.76 mg/dL). Liver & Pancreas: Recent Labs  Lab 07/11/23 1354 07/13/23 1010  AST 20  --   ALT 16  --   ALKPHOS 58  --   BILITOT 0.7  --   PROT 7.3  --   ALBUMIN 3.3* 2.5*   No results for input(s): "LIPASE", "AMYLASE" in the last 168 hours. No results for input(s): "AMMONIA" in the last 168 hours. Diabetic: Recent Labs    07/13/23 1010  HGBA1C 5.8*   Recent Labs  Lab 07/12/23 1149  GLUCAP 77   Cardiac Enzymes: No results for input(s): "CKTOTAL", "CKMB", "CKMBINDEX", "TROPONINI" in the last 168 hours. No results for input(s): "PROBNP" in the last 8760 hours. Coagulation Profile: Recent Labs  Lab 07/11/23 1354  INR 1.1   Thyroid Function Tests: Recent Labs    07/11/23 1354  TSH 0.290*   Lipid Profile: No results for input(s): "CHOL", "HDL", "LDLCALC", "TRIG", "CHOLHDL", "LDLDIRECT" in the last 72 hours. Anemia Panel: No results for input(s):  "VITAMINB12", "FOLATE", "FERRITIN", "TIBC", "IRON", "RETICCTPCT" in the last 72 hours. Urine analysis:    Component Value Date/Time   COLORURINE YELLOW 07/11/2023 1448   APPEARANCEUR CLEAR 07/11/2023 1448   LABSPEC 1.006 07/11/2023 1448   PHURINE 6.0 07/11/2023 1448   GLUCOSEU NEGATIVE 07/11/2023 1448   HGBUR MODERATE (A) 07/11/2023 1448   BILIRUBINUR NEGATIVE 07/11/2023 1448   BILIRUBINUR neg 08/27/2022 1230   KETONESUR NEGATIVE 07/11/2023 1448   PROTEINUR 30 (A) 07/11/2023 1448   UROBILINOGEN 0.2 08/27/2022 1230   NITRITE NEGATIVE 07/11/2023 1448   LEUKOCYTESUR LARGE (A) 07/11/2023 1448   Sepsis Labs: Invalid input(s): "PROCALCITONIN", "LACTICIDVEN"  Microbiology: Recent Results (from the past 240 hour(s))  Blood Culture (routine x 2)     Status: None (Preliminary result)   Collection Time: 07/11/23  1:25 PM   Specimen: BLOOD  Result Value Ref Range Status   Specimen Description BLOOD RIGHT ANTECUBITAL  Final   Special Requests   Final    BOTTLES DRAWN AEROBIC AND ANAEROBIC Blood Culture results may not be optimal due to an excessive volume of blood received in culture bottles   Culture   Final    NO GROWTH 2 DAYS Performed at [facility], [address] [place], [place] [address]    Report Status PENDING  Incomplete  Blood Culture (routine x 2)     Status: None (Preliminary result)   Collection Time: 07/11/23  1:50 PM   Specimen: BLOOD  Result Value Ref Range Status   Specimen Description BLOOD RIGHT ANTECUBITAL  Final   Special Requests   Final    BOTTLES DRAWN AEROBIC AND ANAEROBIC Blood Culture results may not be optimal due to an excessive volume of blood received in culture bottles   Culture   Final    NO GROWTH 2 DAYS Performed at [facility], [address], [place], [place] [address]    Report Status PENDING  Incomplete  Resp panel by RT-PCR (RSV, Flu A&B, Covid) Anterior Nasal Swab     Status: None   Collection Time: 07/11/23  1:54 PM    Specimen: Anterior Nasal Swab  Result Value Ref Range Status  SARS Coronavirus 2 by RT PCR NEGATIVE NEGATIVE Final   Influenza A by PCR NEGATIVE NEGATIVE Final   Influenza B by PCR NEGATIVE NEGATIVE Final    Comment: (NOTE) The Xpert Xpress SARS-CoV-2/FLU/RSV plus assay is intended as an aid in the diagnosis of influenza from Nasopharyngeal swab specimens and should not be used as a sole basis for treatment. Nasal washings and aspirates are unacceptable for Xpert Xpress SARS-CoV-2/FLU/RSV testing.  Fact Sheet for Patients: BloggerCourse.com  Fact Sheet for Healthcare Providers: SeriousBroker.it  This test is not yet approved or cleared by the [place] FDA and has been authorized for detection and/or diagnosis of SARS-CoV-2 by FDA under an Emergency Use Authorization (EUA). This EUA will remain in effect (meaning this test can be used) for the duration of the COVID-19 declaration under Section 564(b)(1) of the Act, 21 U.S.C. section 360bbb-3(b)(1), unless the authorization is terminated or revoked.     Resp Syncytial Virus by PCR NEGATIVE NEGATIVE Final    Comment: (NOTE) Fact Sheet for Patients: BloggerCourse.com  Fact Sheet for Healthcare Providers: SeriousBroker.it  This test is not yet approved or cleared by the [place] FDA and has been authorized for detection and/or diagnosis of SARS-CoV-2 by FDA under an Emergency Use Authorization (EUA). This EUA will remain in effect (meaning this test can be used) for the duration of the COVID-19 declaration under Section 564(b)(1) of the Act, 21 U.S.C. section 360bbb-3(b)(1), unless the authorization is terminated or revoked.  Performed at [facility], [address], [place], [place] [address]   Urine Culture     Status: Abnormal   Collection Time: 07/11/23  2:48 PM   Specimen: Urine, Clean Catch  Result  Value Ref Range Status   Specimen Description URINE, CLEAN CATCH  Final   Special Requests   Final    NONE Reflexed from [id number] Performed at [facility], [address] [place], [place] [address]    Culture MULTIPLE SPECIES PRESENT, SUGGEST RECOLLECTION (A)  Final   Report Status 07/12/2023 FINAL  Final    Radiology Studies: No results found.    Antino Mayabb T. Avri Paiva [facility]  If 7PM-7AM, please contact night-coverage [email or web address] 07/13/2023, 4:35 PM

## 2023-07-14 DIAGNOSIS — L03319 Cellulitis of trunk, unspecified: Secondary | ICD-10-CM | POA: Diagnosis not present

## 2023-07-14 DIAGNOSIS — E876 Hypokalemia: Secondary | ICD-10-CM | POA: Diagnosis not present

## 2023-07-14 DIAGNOSIS — R8290 Unspecified abnormal findings in urine: Secondary | ICD-10-CM | POA: Diagnosis not present

## 2023-07-14 DIAGNOSIS — E785 Hyperlipidemia, unspecified: Secondary | ICD-10-CM | POA: Diagnosis not present

## 2023-07-14 LAB — RENAL FUNCTION PANEL
Albumin: 2.4 g/dL — ABNORMAL LOW (ref 3.5–5.0)
Anion gap: 9 (ref 5–15)
BUN: 8 mg/dL (ref 8–23)
CO2: 23 mmol/L (ref 22–32)
Calcium: 8.2 mg/dL — ABNORMAL LOW (ref 8.9–10.3)
Chloride: 102 mmol/L (ref 98–111)
Creatinine, Ser: 0.71 mg/dL (ref 0.44–1.00)
GFR, Estimated: >60 mL/min (ref >60)
Glucose, Bld: 108 mg/dL — ABNORMAL HIGH (ref 70–99)
Phosphorus: 5.2 mg/dL — ABNORMAL HIGH (ref 2.5–4.6)
Potassium: 4.2 mmol/L (ref 3.5–5.1)
Sodium: 134 mmol/L — ABNORMAL LOW (ref 135–145)

## 2023-07-14 LAB — CBC
HCT: 31.5 % — ABNORMAL LOW (ref 36.0–46.0)
Hemoglobin: 10.5 g/dL — ABNORMAL LOW (ref 12.0–15.0)
MCH: 29.9 pg (ref 26.0–34.0)
MCHC: 33.3 g/dL (ref 30.0–36.0)
MCV: 89.7 fL (ref 80.0–100.0)
Platelets: 193 10*3/uL (ref 150–400)
RBC: 3.51 MIL/uL — ABNORMAL LOW (ref 3.87–5.11)
RDW: 13.2 % (ref 11.5–15.5)
WBC: 7.8 10*3/uL (ref 4.0–10.5)
nRBC: 0 % (ref 0.0–0.2)

## 2023-07-14 LAB — MAGNESIUM: Magnesium: 2.1 mg/dL (ref 1.7–2.4)

## 2023-07-14 NOTE — Plan of Care (Signed)

## 2023-07-14 NOTE — Progress Notes (Signed)
Pharmacy Antibiotic Note  Pamela [name] is a 77 y.o. female admitted on 07/11/2023 with  R labial cellulitis . S/p I&D bedside with purulent drainage 8/12. Pharmacy has been consulted for Vancomycin dosing. Also on cefepime and Flagyl per MD. SCr stable <1.  Plan:  Continue vancomycin 1g IV Q 24 hrs. Goal AUC 400-550. Expected AUC: 535; SCr used: 0.8 Cefepime 2g IV q12h Flagyl 500mg  IV q12h Will f/u renal function, micro data, and pt's clinical condition Vanc levels at steady state   Height: 5\' 1"  (154.9 cm) Weight: 61.2 kg (135 lb) IBW/kg (Calculated) : 47.8  Temp (24hrs), Avg:98.2 F (36.8 C), Min:97.8 F (36.6 C), Max:98.7 F (37.1 C)  Recent Labs  Lab 07/11/23 1344 07/11/23 1354 07/12/23 0222 07/13/23 1010 07/14/23 0110  WBC  --  10.5 7.9 8.1 7.8  CREATININE  --  0.79 0.65 0.76 0.71  LATICACIDVEN 1.6  --   --   --   --     Estimated Creatinine Clearance: 51 mL/min (by C-G formula based on SCr of 0.71 mg/dL).    Allergies  Allergen Reactions   Succinylcholine Other (See Comments)    BRADYCARDIA/  ASYSTOLE 1980's   Boniva [Ibandronate Sodium]     GI   Morphine And Codeine Nausea And Vomiting   Penicillins Hives    Leia Alf, PharmD, BCPS Please check AMION for all [facility] contact numbers Clinical Pharmacist 07/14/2023 2:27 PM

## 2023-07-14 NOTE — Progress Notes (Signed)
PROGRESS NOTE  Pamela [name] ZOX:[id number] DOB: 1946-09-12   PCP: [name], MD  Patient is from: Home.  DOA: 07/11/2023 LOS: 3  Chief complaints Chief Complaint  Patient presents with   Abcess on Labia     Brief Narrative / Interim history: 77 year old F with PMH of HTN, HLD, hypothyroidism and cystocele presenting with swelling and pain of right labia with associated general malaise, poor p.o. intake, fever, chills and nausea and vomiting.  Initially seen in ED on 8/10 and had bedside ultrasound that showed 2 x 2 cm fluid collection in the inferior portion of right labia majora for which she had bedside I&D with purulent drainage.  Culture was not sent.  She was discharged on p.o. doxycycline.  She had progressive swelling, pain and diaphoresis overnight that prompted her to return to ED.  In ED, stable vitals.  Afebrile.  WBC 10.5 with left shift.  Lactic acid negative.  K3.2.  CT abdomen and pelvis showed extensive soft tissue swelling and infiltration in subcutaneous fat  involving the right labia and extending into right perineal region consistent with cellulitis.  Urinalysis with pyuria. .  Blood and urine cultures sent.  Started on IV fluid and broad-spectrum antibiotics.  Gynecology consulted and did not feel there is need for surgery.  GYN following.    Subjective: Seen and examined earlier this morning.  No major events overnight of this morning.  Had "bad "pain" about 5 AM this morning that has improved after pain medication.  Currently no pain but sore to touch.  Had bowel movements.  Objective: Vitals:   07/13/23 2105 07/13/23 2253 07/14/23 0353 07/14/23 0807  BP: 134/73 136/70 (!) 144/84 122/67  Pulse: 61 (!) 56 66 (!) 59  Resp: 17 17 18    Temp: 97.8 F (36.6 C) 98.7 F (37.1 C) 98.4 F (36.9 C)   TempSrc: Oral  Oral   SpO2: 97% 99% 96% 96%  Weight:      Height:        Examination:  GENERAL: No apparent distress.  Nontoxic. HEENT: MMM.  Vision and  hearing grossly intact.  NECK: Supple.  No apparent JVD.  RESP:  No IWOB.  Fair aeration bilaterally. CVS:  RRR. Heart sounds normal.  ABD/GI/GU: BS+. Abd soft, NTND.  Swelling, induration and erythema of right labia majora and adjacent areas.  TTP.  No fluctuance.  Erythema looks better today. MSK/EXT:  Moves extremities. No apparent deformity. No edema.  SKIN: As above. NEURO: Awake, alert and oriented appropriately.  No apparent focal neuro deficit. PSYCH: Calm. Normal affect.   Francis Gaines, charge RN present as chaperone during GU exam.  Procedures:  None  Microbiology summarized: COVID-19, influenza and RSV PCR nonreactive Blood cultures NGTD Urine culture with multiple species.  Assessment and plan: Principal Problem:   Cellulitis Active Problems:   Abnormal urinalysis   Hypokalemia   Hyperlipidemia   Hypothyroidism  Cellulitis of the right labia with possible abscess: Presents with progressive symptoms and constitutional symptoms despite I&D and p.o. doxycycline outpatient.   No obvious fluid collection on CT but significant erythema, induration and tenderness on exam.  No objective fever or significant leukocytosis yet.  Not diabetic.  A1c 5.8%.  Does not smoke.  Culture data as above. -Continue broad-spectrum antibiotics with vancomycin, cefepime and Flagyl -Appreciate GYN input-continue antibiotics -Pain control-Tylenol, ketorolac and Dilaudid   Abnormal urinalysis/pyuria: Denies UTI symptoms.  Urine culture with multiple species.  Normocytic anemia/thrombocytopenia: Likely dilutional. Recent Labs  08/24/22 0939 07/11/23 1354 07/12/23 0222 07/13/23 1010 07/14/23 0110  HGB 13.8 13.1 10.7* 11.2* 10.5*  -Continue monitoring    Hypokalemia/hypophosphatemia/hypomagnesemia -Monitor replenish as appropriate   Hyperlipidemia -Continue simvastatin   Hypothyroidism: TSH low at 0.29.  No clinical signs of hypo or hyperthyroidism. -Continue  levothyroxine. -Repeat TSH in 4 to 6 weeks outpatient    Body mass index is 25.51 kg/m.          DVT prophylaxis:  enoxaparin (LOVENOX) injection 40 mg Start: 07/11/23 2200  Code Status: Full code Family Communication: None at bedside Level of care: Telemetry Medical Status is: Inpatient Remains inpatient appropriate because: Genital cellulitis with possible abscess   Final disposition: Home Consultants:  OB/GYN  35 minutes with more than 50% spent in reviewing records, counseling patient/family and coordinating care.   Sch Meds:  Scheduled Meds:  dorzolamide-timolol  1 drop Both Eyes BID   enoxaparin (LOVENOX) injection  40 mg Subcutaneous Q24H   levothyroxine  88 mcg Oral Q0600   simvastatin  20 mg Oral q1800   sodium chloride flush  3 mL Intravenous Q12H   Continuous Infusions:  ceFEPime (MAXIPIME) IV 2 g (07/14/23 0257)   metronidazole 500 mg (07/14/23 1024)   vancomycin 1,000 mg (07/13/23 1808)   PRN Meds:.acetaminophen **OR** acetaminophen, albuterol, hydrALAZINE, HYDROcodone-acetaminophen, HYDROmorphone (DILAUDID) injection, ketorolac, ondansetron (ZOFRAN) IV  Antimicrobials: Anti-infectives (From admission, onward)    Start     Dose/Rate Route Frequency Ordered Stop   07/13/23 1100  metroNIDAZOLE (FLAGYL) IVPB 500 mg        500 mg 100 mL/hr over 60 Minutes Intravenous Every 12 hours 07/13/23 0949     07/12/23 1800  vancomycin (VANCOCIN) IVPB 1000 mg/200 mL premix        1,000 mg 200 mL/hr over 60 Minutes Intravenous Every 24 hours 07/11/23 1720     07/12/23 0200  ceFEPIme (MAXIPIME) 2 g in sodium chloride 0.9 % 100 mL IVPB        2 g 200 mL/hr over 30 Minutes Intravenous Every 12 hours 07/11/23 1824     07/11/23 1315  vancomycin (VANCOCIN) IVPB 1000 mg/200 mL premix  Status:  Discontinued        1,000 mg 200 mL/hr over 60 Minutes Intravenous  Once 07/11/23 1304 07/11/23 1312   07/11/23 1315  ceFEPIme (MAXIPIME) 2 g in sodium chloride 0.9 % 100 mL  IVPB        2 g 200 mL/hr over 30 Minutes Intravenous  Once 07/11/23 1304 07/11/23 1437   07/11/23 1315  metroNIDAZOLE (FLAGYL) IVPB 500 mg        500 mg 100 mL/hr over 60 Minutes Intravenous  Once 07/11/23 1304 07/11/23 1721   07/11/23 1315  vancomycin (VANCOCIN) IVPB 1000 mg/200 mL premix  Status:  Discontinued        1,000 mg 200 mL/hr over 60 Minutes Intravenous  Once 07/11/23 1304 07/11/23 1312   07/11/23 1315  vancomycin (VANCOREADY) IVPB 1250 mg/250 mL        1,250 mg 166.7 mL/hr over 90 Minutes Intravenous  Once 07/11/23 1312 07/11/23 1930        I have personally reviewed the following labs and images: CBC: Recent Labs  Lab 07/11/23 1354 07/12/23 0222 07/13/23 1010 07/14/23 0110  WBC 10.5 7.9 8.1 7.8  NEUTROABS 8.8*  --   --   --   HGB 13.1 10.7* 11.2* 10.5*  HCT 38.2 30.9* 32.6* 31.5*  MCV 89.5 89.0 89.6 89.7  PLT 175  149* 192 193   BMP &GFR Recent Labs  Lab 07/11/23 1354 07/12/23 0222 07/13/23 1010 07/14/23 0110  NA 132* 136 138 134*  K 3.2* 4.0 3.6 4.2  CL 96* 104 106 102  CO2 23 21* 23 23  GLUCOSE 124* 93 102* 108*  BUN 14 10 6* 8  CREATININE 0.79 0.65 0.76 0.71  CALCIUM 9.0 8.2* 8.5* 8.2*  MG  --   --  1.7 2.1  PHOS  --   --  1.8* 5.2*   Estimated Creatinine Clearance: 51 mL/min (by C-G formula based on SCr of 0.71 mg/dL). Liver & Pancreas: Recent Labs  Lab 07/11/23 1354 07/13/23 1010 07/14/23 0110  AST 20  --   --   ALT 16  --   --   ALKPHOS 58  --   --   BILITOT 0.7  --   --   PROT 7.3  --   --   ALBUMIN 3.3* 2.5* 2.4*   No results for input(s): "LIPASE", "AMYLASE" in the last 168 hours. No results for input(s): "AMMONIA" in the last 168 hours. Diabetic: Recent Labs    07/13/23 1010  HGBA1C 5.8*   Recent Labs  Lab 07/12/23 1149  GLUCAP 77   Cardiac Enzymes: No results for input(s): "CKTOTAL", "CKMB", "CKMBINDEX", "TROPONINI" in the last 168 hours. No results for input(s): "PROBNP" in the last 8760 hours. Coagulation  Profile: Recent Labs  Lab 07/11/23 1354  INR 1.1   Thyroid Function Tests: Recent Labs    07/11/23 1354  TSH 0.290*   Lipid Profile: No results for input(s): "CHOL", "HDL", "LDLCALC", "TRIG", "CHOLHDL", "LDLDIRECT" in the last 72 hours. Anemia Panel: No results for input(s): "VITAMINB12", "FOLATE", "FERRITIN", "TIBC", "IRON", "RETICCTPCT" in the last 72 hours. Urine analysis:    Component Value Date/Time   COLORURINE YELLOW 07/11/2023 1448   APPEARANCEUR CLEAR 07/11/2023 1448   LABSPEC 1.006 07/11/2023 1448   PHURINE 6.0 07/11/2023 1448   GLUCOSEU NEGATIVE 07/11/2023 1448   HGBUR MODERATE (A) 07/11/2023 1448   BILIRUBINUR NEGATIVE 07/11/2023 1448   BILIRUBINUR neg 08/27/2022 1230   KETONESUR NEGATIVE 07/11/2023 1448   PROTEINUR 30 (A) 07/11/2023 1448   UROBILINOGEN 0.2 08/27/2022 1230   NITRITE NEGATIVE 07/11/2023 1448   LEUKOCYTESUR LARGE (A) 07/11/2023 1448   Sepsis Labs: Invalid input(s): "PROCALCITONIN", "LACTICIDVEN"  Microbiology: Recent Results (from the past 240 hour(s))  Blood Culture (routine x 2)     Status: None (Preliminary result)   Collection Time: 07/11/23  1:25 PM   Specimen: BLOOD  Result Value Ref Range Status   Specimen Description BLOOD RIGHT ANTECUBITAL  Final   Special Requests   Final    BOTTLES DRAWN AEROBIC AND ANAEROBIC Blood Culture results may not be optimal due to an excessive volume of blood received in culture bottles   Culture   Final    NO GROWTH 3 DAYS Performed at [facility], [address] [place], [place] [address]    Report Status PENDING  Incomplete  Blood Culture (routine x 2)     Status: None (Preliminary result)   Collection Time: 07/11/23  1:50 PM   Specimen: BLOOD  Result Value Ref Range Status   Specimen Description BLOOD RIGHT ANTECUBITAL  Final   Special Requests   Final    BOTTLES DRAWN AEROBIC AND ANAEROBIC Blood Culture results may not be optimal due to an excessive volume of blood received in culture  bottles   Culture   Final    NO  GROWTH 3 DAYS Performed at [facility], [address], [place], [place] [address]    Report Status PENDING  Incomplete  Resp panel by RT-PCR (RSV, Flu A&B, Covid) Anterior Nasal Swab     Status: None   Collection Time: 07/11/23  1:54 PM   Specimen: Anterior Nasal Swab  Result Value Ref Range Status   SARS Coronavirus 2 by RT PCR NEGATIVE NEGATIVE Final   Influenza A by PCR NEGATIVE NEGATIVE Final   Influenza B by PCR NEGATIVE NEGATIVE Final    Comment: (NOTE) The Xpert Xpress SARS-CoV-2/FLU/RSV plus assay is intended as an aid in the diagnosis of influenza from Nasopharyngeal swab specimens and should not be used as a sole basis for treatment. Nasal washings and aspirates are unacceptable for Xpert Xpress SARS-CoV-2/FLU/RSV testing.  Fact Sheet for Patients: BloggerCourse.com  Fact Sheet for Healthcare Providers: SeriousBroker.it  This test is not yet approved or cleared by the [place] FDA and has been authorized for detection and/or diagnosis of SARS-CoV-2 by FDA under an Emergency Use Authorization (EUA). This EUA will remain in effect (meaning this test can be used) for the duration of the COVID-19 declaration under Section 564(b)(1) of the Act, 21 U.S.C. section 360bbb-3(b)(1), unless the authorization is terminated or revoked.     Resp Syncytial Virus by PCR NEGATIVE NEGATIVE Final    Comment: (NOTE) Fact Sheet for Patients: BloggerCourse.com  Fact Sheet for Healthcare Providers: SeriousBroker.it  This test is not yet approved or cleared by the [place] FDA and has been authorized for detection and/or diagnosis of SARS-CoV-2 by FDA under an Emergency Use Authorization (EUA). This EUA will remain in effect (meaning this test can be used) for the duration of the COVID-19 declaration under Section 564(b)(1) of the Act, 21  U.S.C. section 360bbb-3(b)(1), unless the authorization is terminated or revoked.  Performed at [facility], [address], [place], [place] [address]   Urine Culture     Status: Abnormal   Collection Time: 07/11/23  2:48 PM   Specimen: Urine, Clean Catch  Result Value Ref Range Status   Specimen Description URINE, CLEAN CATCH  Final   Special Requests   Final    NONE Reflexed from [id number] Performed at [facility], [address], [place], [place] [address]    Culture MULTIPLE SPECIES PRESENT, SUGGEST RECOLLECTION (A)  Final   Report Status 07/12/2023 FINAL  Final    Radiology Studies: No results found.    Rowena Moilanen T. Aycen Porreca [facility]  If 7PM-7AM, please contact night-coverage [email or web address] 07/14/2023, 10:45 AM

## 2023-07-14 NOTE — Progress Notes (Signed)
Subjective:f/u right labial cellulitis s/p I&D  Patient reports tolerating PO and no problems voiding.   Still notes swelling but pain seems to be less Objective: I have reviewed patient's vital signs, intake and output, medications, and labs. Blood pressure 122/67, pulse (!) 59, temperature 98.4 F (36.9 C), temperature source Oral, resp. rate 18, height 5\' 1"  (1.549 m), weight 61.2 kg, SpO2 96%.  General: alert, cooperative, and no distress Right labium and mons less erythema and swelling no fluctuance or mass, not tender  CBC    Component Value Date/Time   WBC 7.8 07/14/2023 0110   RBC 3.51 (L) 07/14/2023 0110   HGB 10.5 (L) 07/14/2023 0110   HCT 31.5 (L) 07/14/2023 0110   PLT 193 07/14/2023 0110   MCV 89.7 07/14/2023 0110   MCH 29.9 07/14/2023 0110   MCHC 33.3 07/14/2023 0110   RDW 13.2 07/14/2023 0110   LYMPHSABS 0.9 07/11/2023 1354   MONOABS 0.7 07/11/2023 1354   EOSABS 0.0 07/11/2023 1354   BASOSABS 0.0 07/11/2023 1354    Assessment/Plan: Progressing slowly on IV ABX, continue   LOS: 3 days    Scheryl Darter, MD 07/14/2023, 10:49 AM

## 2023-07-15 ENCOUNTER — Inpatient Hospital Stay (HOSPITAL_COMMUNITY): Payer: Medicare Other

## 2023-07-15 DIAGNOSIS — E785 Hyperlipidemia, unspecified: Secondary | ICD-10-CM | POA: Diagnosis not present

## 2023-07-15 DIAGNOSIS — R8290 Unspecified abnormal findings in urine: Secondary | ICD-10-CM | POA: Diagnosis not present

## 2023-07-15 DIAGNOSIS — L03315 Cellulitis of perineum: Secondary | ICD-10-CM | POA: Diagnosis not present

## 2023-07-15 DIAGNOSIS — E876 Hypokalemia: Secondary | ICD-10-CM | POA: Diagnosis not present

## 2023-07-15 MED ORDER — PANTOPRAZOLE SODIUM 40 MG PO TBEC
40.0000 mg | DELAYED_RELEASE_TABLET | Freq: Every day | ORAL | Status: DC
  Administered 2023-07-15 – 2023-07-17 (×3): 40 mg via ORAL
  Filled 2023-07-15 (×3): qty 1

## 2023-07-15 MED ORDER — NAPROXEN 250 MG PO TABS
500.0000 mg | ORAL_TABLET | Freq: Two times a day (BID) | ORAL | Status: DC
  Administered 2023-07-15 – 2023-07-17 (×4): 500 mg via ORAL
  Filled 2023-07-15 (×4): qty 2

## 2023-07-15 MED ORDER — IOHEXOL 350 MG/ML SOLN
75.0000 mL | Freq: Once | INTRAVENOUS | Status: AC | PRN
  Administered 2023-07-15: 75 mL via INTRAVENOUS

## 2023-07-15 NOTE — Care Management Important Message (Signed)
Important Message  Patient Details  Name: Pamela [name] MRN: [id number] Date of Birth: [birth year]   Medicare Important Message Given:  Yes     Kimberlyann Hollar Stefan Church 07/15/2023, 2:47 PM

## 2023-07-15 NOTE — Progress Notes (Signed)
PROGRESS NOTE  Pamela [name] VHQ:[id number] DOB: 04-20-1946   PCP: [name], MD  Patient is from: Home.  DOA: 07/11/2023 LOS: 4  Chief complaints Chief Complaint  Patient presents with   Abcess on Labia     Brief Narrative / Interim history: 77 year old F with PMH of HTN, HLD, hypothyroidism and cystocele presenting with swelling and pain of right labia with associated general malaise, poor p.o. intake, fever, chills and nausea and vomiting.  Initially seen in ED on 8/10 and had bedside ultrasound that showed 2 x 2 cm fluid collection in the inferior portion of right labia majora for which she had bedside I&D with purulent drainage.  Culture was not sent.  She was discharged on p.o. doxycycline.  She had progressive swelling, pain and diaphoresis overnight that prompted her to return to ED.  In ED, stable vitals.  Afebrile.  WBC 10.5 with left shift.  Lactic acid negative.  K3.2.  CT abdomen and pelvis showed extensive soft tissue swelling and infiltration in subcutaneous fat  involving the right labia and extending into right perineal region consistent with cellulitis.  Urinalysis with pyuria. .  Blood and urine cultures sent.  Started on IV fluid and broad-spectrum antibiotics.  Gynecology following and did not feel there is need for I&D.  Patient continues to endorse significant pain.  Repeat CT pelvis ordered.  Remains on broad-spectrum antibiotics.    Subjective: Seen and examined earlier this morning.  Continues to endorse severe migrating pain.  She rates the pain 10/10 although she does not appear to be in that much distress.  Objective: Vitals:   07/14/23 1920 07/15/23 0123 07/15/23 0412 07/15/23 0746  BP: 133/75 (!) 141/66 117/67 (!) 156/98  Pulse: 65 65 62 64  Resp: 17 18 17 18   Temp:  97.6 F (36.4 C)  97.9 F (36.6 C)  TempSrc:      SpO2: 97% 98% 96% 98%  Weight:      Height:        Examination:  GENERAL: No apparent distress.  Nontoxic. HEENT: MMM.   Vision and hearing grossly intact.  NECK: Supple.  No apparent JVD.  RESP:  No IWOB.  Fair aeration bilaterally. CVS:  RRR. Heart sounds normal.  ABD/GI/GU: BS+. Abd soft, NTND.  Swelling, induration and erythema of right labia majora and adjacent areas.  TTP.  No fluctuance.  MSK/EXT:  Moves extremities. No apparent deformity. No edema.  SKIN: As above. NEURO: Awake, alert and oriented appropriately.  No apparent focal neuro deficit. PSYCH: Calm. Normal affect.   Francis Gaines, charge RN present as chaperone during GU exam.  Procedures:  None  Microbiology summarized: COVID-19, influenza and RSV PCR nonreactive Blood cultures NGTD Urine culture with multiple species.  Assessment and plan: Principal Problem:   Cellulitis Active Problems:   Abnormal urinalysis   Hypokalemia   Hyperlipidemia   Hypothyroidism  Cellulitis of the right labia with possible abscess: Presents with progressive symptoms and constitutional symptoms despite I&D and p.o. doxycycline outpatient.  No objective fever or significant leukocytosis yet.  Not diabetic.  A1c 5.8%.  Does not smoke.  Culture data as above.  Continues to have significant pain, tenderness, swelling and erythema despite broad-spectrum antibiotics.  Noted that she has not been getting Toradol -Continue broad-spectrum antibiotics with vancomycin, cefepime and Flagyl -Repeat CT pelvis -Pain control-scheduled naproxen 500 mg twice daily -Watch renal function closely -Continue Tylenol, Norco and Dilaudid as needed based on pain severity -Appreciate GYN input-continue antibiotics  Abnormal urinalysis/pyuria: Denies UTI symptoms.  Urine culture with multiple species.  Normocytic anemia/thrombocytopenia: Likely dilutional. Recent Labs    08/24/22 0939 07/11/23 1354 07/12/23 0222 07/13/23 1010 07/14/23 0110  HGB 13.8 13.1 10.7* 11.2* 10.5*  -Continue monitoring  Hypokalemia/hypophosphatemia/hypomagnesemia -Monitor replenish as  appropriate   Hyperlipidemia -Continue simvastatin   Hypothyroidism: TSH low at 0.29.  No clinical signs of hypo or hyperthyroidism. -Continue levothyroxine. -Repeat TSH in 4 to 6 weeks outpatient    Body mass index is 25.51 kg/m.          DVT prophylaxis:  enoxaparin (LOVENOX) injection 40 mg Start: 07/11/23 2200  Code Status: Full code Family Communication: None at bedside Level of care: Telemetry Medical Status is: Inpatient Remains inpatient appropriate because: Genital cellulitis with possible abscess   Final disposition: Home Consultants:  OB/GYN  35 minutes with more than 50% spent in reviewing records, counseling patient/family and coordinating care.   Sch Meds:  Scheduled Meds:  dorzolamide-timolol  1 drop Both Eyes BID   enoxaparin (LOVENOX) injection  40 mg Subcutaneous Q24H   levothyroxine  88 mcg Oral Q0600   naproxen  500 mg Oral BID WC   pantoprazole  40 mg Oral Daily   simvastatin  20 mg Oral q1800   sodium chloride flush  3 mL Intravenous Q12H   Continuous Infusions:  ceFEPime (MAXIPIME) IV 2 g (07/15/23 0121)   metronidazole 500 mg (07/15/23 1017)   vancomycin 1,000 mg (07/14/23 1827)   PRN Meds:.acetaminophen **OR** acetaminophen, albuterol, hydrALAZINE, HYDROcodone-acetaminophen, HYDROmorphone (DILAUDID) injection, ondansetron (ZOFRAN) IV  Antimicrobials: Anti-infectives (From admission, onward)    Start     Dose/Rate Route Frequency Ordered Stop   07/13/23 1100  metroNIDAZOLE (FLAGYL) IVPB 500 mg        500 mg 100 mL/hr over 60 Minutes Intravenous Every 12 hours 07/13/23 0949     07/12/23 1800  vancomycin (VANCOCIN) IVPB 1000 mg/200 mL premix        1,000 mg 200 mL/hr over 60 Minutes Intravenous Every 24 hours 07/11/23 1720     07/12/23 0200  ceFEPIme (MAXIPIME) 2 g in sodium chloride 0.9 % 100 mL IVPB        2 g 200 mL/hr over 30 Minutes Intravenous Every 12 hours 07/11/23 1824     07/11/23 1315  vancomycin (VANCOCIN) IVPB 1000  mg/200 mL premix  Status:  Discontinued        1,000 mg 200 mL/hr over 60 Minutes Intravenous  Once 07/11/23 1304 07/11/23 1312   07/11/23 1315  ceFEPIme (MAXIPIME) 2 g in sodium chloride 0.9 % 100 mL IVPB        2 g 200 mL/hr over 30 Minutes Intravenous  Once 07/11/23 1304 07/11/23 1437   07/11/23 1315  metroNIDAZOLE (FLAGYL) IVPB 500 mg        500 mg 100 mL/hr over 60 Minutes Intravenous  Once 07/11/23 1304 07/11/23 1721   07/11/23 1315  vancomycin (VANCOCIN) IVPB 1000 mg/200 mL premix  Status:  Discontinued        1,000 mg 200 mL/hr over 60 Minutes Intravenous  Once 07/11/23 1304 07/11/23 1312   07/11/23 1315  vancomycin (VANCOREADY) IVPB 1250 mg/250 mL        1,250 mg 166.7 mL/hr over 90 Minutes Intravenous  Once 07/11/23 1312 07/11/23 1930        I have personally reviewed the following labs and images: CBC: Recent Labs  Lab 07/11/23 1354 07/12/23 0222 07/13/23 1010 07/14/23 0110  WBC 10.5 7.9  8.1 7.8  NEUTROABS 8.8*  --   --   --   HGB 13.1 10.7* 11.2* 10.5*  HCT 38.2 30.9* 32.6* 31.5*  MCV 89.5 89.0 89.6 89.7  PLT 175 149* 192 193   BMP &GFR Recent Labs  Lab 07/11/23 1354 07/12/23 0222 07/13/23 1010 07/14/23 0110  NA 132* 136 138 134*  K 3.2* 4.0 3.6 4.2  CL 96* 104 106 102  CO2 23 21* 23 23  GLUCOSE 124* 93 102* 108*  BUN 14 10 6* 8  CREATININE 0.79 0.65 0.76 0.71  CALCIUM 9.0 8.2* 8.5* 8.2*  MG  --   --  1.7 2.1  PHOS  --   --  1.8* 5.2*   Estimated Creatinine Clearance: 51 mL/min (by C-G formula based on SCr of 0.71 mg/dL). Liver & Pancreas: Recent Labs  Lab 07/11/23 1354 07/13/23 1010 07/14/23 0110  AST 20  --   --   ALT 16  --   --   ALKPHOS 58  --   --   BILITOT 0.7  --   --   PROT 7.3  --   --   ALBUMIN 3.3* 2.5* 2.4*   No results for input(s): "LIPASE", "AMYLASE" in the last 168 hours. No results for input(s): "AMMONIA" in the last 168 hours. Diabetic: Recent Labs    07/13/23 1010  HGBA1C 5.8*   Recent Labs  Lab 07/12/23 1149   GLUCAP 77   Cardiac Enzymes: No results for input(s): "CKTOTAL", "CKMB", "CKMBINDEX", "TROPONINI" in the last 168 hours. No results for input(s): "PROBNP" in the last 8760 hours. Coagulation Profile: Recent Labs  Lab 07/11/23 1354  INR 1.1   Thyroid Function Tests: No results for input(s): "TSH", "T4TOTAL", "FREET4", "T3FREE", "THYROIDAB" in the last 72 hours.  Lipid Profile: No results for input(s): "CHOL", "HDL", "LDLCALC", "TRIG", "CHOLHDL", "LDLDIRECT" in the last 72 hours. Anemia Panel: No results for input(s): "VITAMINB12", "FOLATE", "FERRITIN", "TIBC", "IRON", "RETICCTPCT" in the last 72 hours. Urine analysis:    Component Value Date/Time   COLORURINE YELLOW 07/11/2023 1448   APPEARANCEUR CLEAR 07/11/2023 1448   LABSPEC 1.006 07/11/2023 1448   PHURINE 6.0 07/11/2023 1448   GLUCOSEU NEGATIVE 07/11/2023 1448   HGBUR MODERATE (A) 07/11/2023 1448   BILIRUBINUR NEGATIVE 07/11/2023 1448   BILIRUBINUR neg 08/27/2022 1230   KETONESUR NEGATIVE 07/11/2023 1448   PROTEINUR 30 (A) 07/11/2023 1448   UROBILINOGEN 0.2 08/27/2022 1230   NITRITE NEGATIVE 07/11/2023 1448   LEUKOCYTESUR LARGE (A) 07/11/2023 1448   Sepsis Labs: Invalid input(s): "PROCALCITONIN", "LACTICIDVEN"  Microbiology: Recent Results (from the past 240 hour(s))  Blood Culture (routine x 2)     Status: None (Preliminary result)   Collection Time: 07/11/23  1:25 PM   Specimen: BLOOD  Result Value Ref Range Status   Specimen Description BLOOD RIGHT ANTECUBITAL  Final   Special Requests   Final    BOTTLES DRAWN AEROBIC AND ANAEROBIC Blood Culture results may not be optimal due to an excessive volume of blood received in culture bottles   Culture   Final    NO GROWTH 4 DAYS Performed at [facility], [address], [place], [place] [address]    Report Status PENDING  Incomplete  Blood Culture (routine x 2)     Status: None (Preliminary result)   Collection Time: 07/11/23  1:50 PM   Specimen: BLOOD   Result Value Ref Range Status   Specimen Description BLOOD RIGHT ANTECUBITAL  Final   Special Requests  Final    BOTTLES DRAWN AEROBIC AND ANAEROBIC Blood Culture results may not be optimal due to an excessive volume of blood received in culture bottles   Culture   Final    NO GROWTH 4 DAYS Performed at [facility], [address] [place], [place] [address]    Report Status PENDING  Incomplete  Resp panel by RT-PCR (RSV, Flu A&B, Covid) Anterior Nasal Swab     Status: None   Collection Time: 07/11/23  1:54 PM   Specimen: Anterior Nasal Swab  Result Value Ref Range Status   SARS Coronavirus 2 by RT PCR NEGATIVE NEGATIVE Final   Influenza A by PCR NEGATIVE NEGATIVE Final   Influenza B by PCR NEGATIVE NEGATIVE Final    Comment: (NOTE) The Xpert Xpress SARS-CoV-2/FLU/RSV plus assay is intended as an aid in the diagnosis of influenza from Nasopharyngeal swab specimens and should not be used as a sole basis for treatment. Nasal washings and aspirates are unacceptable for Xpert Xpress SARS-CoV-2/FLU/RSV testing.  Fact Sheet for Patients: BloggerCourse.com  Fact Sheet for Healthcare Providers: SeriousBroker.it  This test is not yet approved or cleared by the [place] FDA and has been authorized for detection and/or diagnosis of SARS-CoV-2 by FDA under an Emergency Use Authorization (EUA). This EUA will remain in effect (meaning this test can be used) for the duration of the COVID-19 declaration under Section 564(b)(1) of the Act, 21 U.S.C. section 360bbb-3(b)(1), unless the authorization is terminated or revoked.     Resp Syncytial Virus by PCR NEGATIVE NEGATIVE Final    Comment: (NOTE) Fact Sheet for Patients: BloggerCourse.com  Fact Sheet for Healthcare Providers: SeriousBroker.it  This test is not yet approved or cleared by the [place] FDA and has been  authorized for detection and/or diagnosis of SARS-CoV-2 by FDA under an Emergency Use Authorization (EUA). This EUA will remain in effect (meaning this test can be used) for the duration of the COVID-19 declaration under Section 564(b)(1) of the Act, 21 U.S.C. section 360bbb-3(b)(1), unless the authorization is terminated or revoked.  Performed at [facility], [address], [place], [place] [address]   Urine Culture     Status: Abnormal   Collection Time: 07/11/23  2:48 PM   Specimen: Urine, Clean Catch  Result Value Ref Range Status   Specimen Description URINE, CLEAN CATCH  Final   Special Requests   Final    NONE Reflexed from [id number] Performed at [facility], [address], [place], [place] [address]    Culture MULTIPLE SPECIES PRESENT, SUGGEST RECOLLECTION (A)  Final   Report Status 07/12/2023 FINAL  Final    Radiology Studies: No results found.    Evony Rezek T. Elona Yinger [facility]  If 7PM-7AM, please contact night-coverage [email or web address] 07/15/2023, 1:59 PM

## 2023-07-15 NOTE — Plan of Care (Signed)
 Olena Heckle, LPN

## 2023-07-16 DIAGNOSIS — E785 Hyperlipidemia, unspecified: Secondary | ICD-10-CM | POA: Diagnosis not present

## 2023-07-16 DIAGNOSIS — E876 Hypokalemia: Secondary | ICD-10-CM | POA: Diagnosis not present

## 2023-07-16 DIAGNOSIS — L03315 Cellulitis of perineum: Secondary | ICD-10-CM | POA: Diagnosis not present

## 2023-07-16 DIAGNOSIS — R8290 Unspecified abnormal findings in urine: Secondary | ICD-10-CM | POA: Diagnosis not present

## 2023-07-16 LAB — MAGNESIUM: Magnesium: 2.1 mg/dL (ref 1.7–2.4)

## 2023-07-16 LAB — RENAL FUNCTION PANEL
Albumin: 2.9 g/dL — ABNORMAL LOW (ref 3.5–5.0)
Anion gap: 9 (ref 5–15)
BUN: 9 mg/dL (ref 8–23)
CO2: 21 mmol/L — ABNORMAL LOW (ref 22–32)
Calcium: 8.7 mg/dL — ABNORMAL LOW (ref 8.9–10.3)
Chloride: 107 mmol/L (ref 98–111)
Creatinine, Ser: 0.73 mg/dL (ref 0.44–1.00)
GFR, Estimated: >60 mL/min (ref >60)
Glucose, Bld: 100 mg/dL — ABNORMAL HIGH (ref 70–99)
Phosphorus: 3 mg/dL (ref 2.5–4.6)
Potassium: 4 mmol/L (ref 3.5–5.1)
Sodium: 137 mmol/L (ref 135–145)

## 2023-07-16 LAB — CBC
HCT: 37.4 % (ref 36.0–46.0)
Hemoglobin: 12.4 g/dL (ref 12.0–15.0)
MCH: 30.5 pg (ref 26.0–34.0)
MCHC: 33.2 g/dL (ref 30.0–36.0)
MCV: 92.1 fL (ref 80.0–100.0)
Platelets: 332 10*3/uL (ref 150–400)
RBC: 4.06 MIL/uL (ref 3.87–5.11)
RDW: 13.2 % (ref 11.5–15.5)
WBC: 10.2 10*3/uL (ref 4.0–10.5)
nRBC: 0 % (ref 0.0–0.2)

## 2023-07-16 MED ORDER — METRONIDAZOLE 500 MG/100ML IV SOLN
500.0000 mg | Freq: Two times a day (BID) | INTRAVENOUS | Status: DC
  Administered 2023-07-16: 500 mg via INTRAVENOUS
  Filled 2023-07-16: qty 100

## 2023-07-16 MED ORDER — NYSTATIN 100000 UNIT/ML MT SUSP
5.0000 mL | Freq: Four times a day (QID) | OROMUCOSAL | Status: DC
  Administered 2023-07-16 – 2023-07-17 (×4): 500000 [IU] via OROMUCOSAL
  Filled 2023-07-16 (×7): qty 5

## 2023-07-16 MED ORDER — SODIUM CHLORIDE 0.9 % IV SOLN
1.0000 g | INTRAVENOUS | Status: DC
  Administered 2023-07-16: 1 g via INTRAVENOUS
  Filled 2023-07-16: qty 10

## 2023-07-16 MED ORDER — NYSTATIN 100000 UNIT/ML MT SUSP: 5.0000 mL | Freq: Four times a day (QID) | OROMUCOSAL | Status: DC

## 2023-07-16 NOTE — Progress Notes (Signed)
PROGRESS NOTE  Pamela [name] NUU:[id number] DOB: [birth year]   PCP: [name], MD  Patient is from: Home.  DOA: 07/11/2023 LOS: 5  Chief complaints Chief Complaint  Patient presents with   Abcess on Labia     Brief Narrative / Interim history: 77 year old F with PMH of HTN, HLD, hypothyroidism and cystocele presenting with swelling and pain of right labia with associated general malaise, poor p.o. intake, fever, chills and nausea and vomiting.  Initially seen in ED on 8/10 and had bedside ultrasound that showed 2 x 2 cm fluid collection in the inferior portion of right labia majora for which she had bedside I&D with purulent drainage.  Culture was not sent.  She was discharged on p.o. doxycycline.  She had progressive swelling, pain and diaphoresis overnight that prompted her to return to ED.  In ED, stable vitals.  Afebrile.  WBC 10.5 with left shift.  Lactic acid negative.  K3.2.  CT abdomen and pelvis showed extensive soft tissue swelling and infiltration in subcutaneous fat  involving the right labia and extending into right perineal region consistent with cellulitis.  Urinalysis with pyuria. .  Blood and urine cultures sent.  Started on IV fluid and broad-spectrum antibiotics.  Gynecology following and did not feel there is need for I&D.  Patient continues to endorse significant pain.  Repeat CT pelvis on 8/16 with similar appearance of diffuse inflammatory change without abscess.  Swelling and pain improved after starting NSAID.  De-escalated antibiotics to ceftriaxone and Flagyl.    Subjective: Seen and examined earlier this morning.  No major events overnight of this morning.  Continues to endorse pain but better than yesterday.  Describes the pain as burning.  Objective: Vitals:   07/16/23 0049 07/16/23 0359 07/16/23 0733 07/16/23 1126  BP: 134/67 130/73 (!) 185/93 (!) 119/92  Pulse: (!) 58 60 69 62  Resp:   16 17  Temp: 98.3 F (36.8 C)  98.2 F (36.8 C) 98.1 F (36.7  C)  TempSrc: Oral  Oral Oral  SpO2: 95% 97% 98% 96%  Weight:      Height:        Examination:  GENERAL: No apparent distress.  Nontoxic. HEENT: MMM.  Vision and hearing grossly intact.  NECK: Supple.  No apparent JVD.  RESP:  No IWOB.  Fair aeration bilaterally. CVS:  RRR. Heart sounds normal.  ABD/GI/GU: BS+. Abd soft, NTND.  Improved swelling, induration and erythema.  TTP.  No fluctuance.  MSK/EXT:  Moves extremities. No apparent deformity. No edema.  SKIN: As above. NEURO: Awake, alert and oriented appropriately.  No apparent focal neuro deficit. PSYCH: Calm. Normal affect.   Olena Heckle, LPN present as chaperone during perineal exam.  Procedures:  None  Microbiology summarized: COVID-19, influenza and RSV PCR nonreactive Blood cultures NGTD Urine culture with multiple species.  Assessment and plan: Principal Problem:   Cellulitis Active Problems:   Abnormal urinalysis   Hypokalemia   Hyperlipidemia   Hypothyroidism  Cellulitis of the right labia with possible abscess: Presents with progressive symptoms and constitutional symptoms despite I&D and p.o. doxycycline outpatient.  No objective fever or significant leukocytosis yet.  Not diabetic.  A1c 5.8%.  Does not smoke.  Culture data as above.  Repeat CT without abscess.  Cellulitis improved after starting naproxen.  -De-escalate antibiotic to ceftriaxone and Flagyl.  -Pain control-scheduled naproxen 500 mg twice daily -Continue Tylenol, Norco and Dilaudid as needed based on pain severity -Appreciate GYN input-continue antibiotics  Abnormal urinalysis/pyuria:  Denies UTI symptoms.  Urine culture with multiple species.  Normocytic anemia/thrombocytopenia: Likely dilutional.  Improved. Recent Labs    08/24/22 0939 07/11/23 1354 07/12/23 0222 07/13/23 1010 07/14/23 0110 07/16/23 0600  HGB 13.8 13.1 10.7* 11.2* 10.5* 12.4  -Continue monitoring  Hypokalemia/hypophosphatemia/hypomagnesemia -Monitor replenish  as appropriate   Hyperlipidemia -Continue simvastatin   Hypothyroidism: TSH low at 0.29.  No clinical signs of hypo or hyperthyroidism. -Continue levothyroxine. -Repeat TSH in 4 to 6 weeks outpatient  Oral candidiasis? -Nystatin suspension. -De-escalated antibiotics.    Body mass index is 25.51 kg/m.          DVT prophylaxis:  enoxaparin (LOVENOX) injection 40 mg Start: 07/11/23 2200  Code Status: Full code Family Communication: None at bedside Level of care: Med-Surg Status is: Inpatient Remains inpatient appropriate because: Genital cellulitis with possible abscess   Final disposition: Home Consultants:  OB/GYN  35 minutes with more than 50% spent in reviewing records, counseling patient/family and coordinating care.   Sch Meds:  Scheduled Meds:  dorzolamide-timolol  1 drop Both Eyes BID   enoxaparin (LOVENOX) injection  40 mg Subcutaneous Q24H   levothyroxine  88 mcg Oral Q0600   naproxen  500 mg Oral BID WC   nystatin  5 mL Mouth/Throat QID   pantoprazole  40 mg Oral Daily   simvastatin  20 mg Oral q1800   sodium chloride flush  3 mL Intravenous Q12H   Continuous Infusions:  cefTRIAXone (ROCEPHIN)  IV 1 g (07/16/23 1228)   metronidazole     PRN Meds:.acetaminophen **OR** acetaminophen, albuterol, hydrALAZINE, HYDROcodone-acetaminophen, HYDROmorphone (DILAUDID) injection, ondansetron (ZOFRAN) IV  Antimicrobials: Anti-infectives (From admission, onward)    Start     Dose/Rate Route Frequency Ordered Stop   07/16/23 2300  metroNIDAZOLE (FLAGYL) IVPB 500 mg        500 mg 100 mL/hr over 60 Minutes Intravenous Every 12 hours 07/16/23 1120 07/18/23 1059   07/16/23 1215  cefTRIAXone (ROCEPHIN) 1 g in sodium chloride 0.9 % 100 mL IVPB        1 g 200 mL/hr over 30 Minutes Intravenous Every 24 hours 07/16/23 1120 07/23/23 1214   07/13/23 1100  metroNIDAZOLE (FLAGYL) IVPB 500 mg  Status:  Discontinued        500 mg 100 mL/hr over 60 Minutes Intravenous Every  12 hours 07/13/23 0949 07/16/23 1120   07/12/23 1800  vancomycin (VANCOCIN) IVPB 1000 mg/200 mL premix  Status:  Discontinued        1,000 mg 200 mL/hr over 60 Minutes Intravenous Every 24 hours 07/11/23 1720 07/16/23 1120   07/12/23 0200  ceFEPIme (MAXIPIME) 2 g in sodium chloride 0.9 % 100 mL IVPB  Status:  Discontinued        2 g 200 mL/hr over 30 Minutes Intravenous Every 12 hours 07/11/23 1824 07/16/23 1120   07/11/23 1315  vancomycin (VANCOCIN) IVPB 1000 mg/200 mL premix  Status:  Discontinued        1,000 mg 200 mL/hr over 60 Minutes Intravenous  Once 07/11/23 1304 07/11/23 1312   07/11/23 1315  ceFEPIme (MAXIPIME) 2 g in sodium chloride 0.9 % 100 mL IVPB        2 g 200 mL/hr over 30 Minutes Intravenous  Once 07/11/23 1304 07/11/23 1437   07/11/23 1315  metroNIDAZOLE (FLAGYL) IVPB 500 mg        500 mg 100 mL/hr over 60 Minutes Intravenous  Once 07/11/23 1304 07/11/23 1721   07/11/23 1315  vancomycin (VANCOCIN) IVPB 1000 mg/200  mL premix  Status:  Discontinued        1,000 mg 200 mL/hr over 60 Minutes Intravenous  Once 07/11/23 1304 07/11/23 1312   07/11/23 1315  vancomycin (VANCOREADY) IVPB 1250 mg/250 mL        1,250 mg 166.7 mL/hr over 90 Minutes Intravenous  Once 07/11/23 1312 07/11/23 1930        I have personally reviewed the following labs and images: CBC: Recent Labs  Lab 07/11/23 1354 07/12/23 0222 07/13/23 1010 07/14/23 0110 07/16/23 0600  WBC 10.5 7.9 8.1 7.8 10.2  NEUTROABS 8.8*  --   --   --   --   HGB 13.1 10.7* 11.2* 10.5* 12.4  HCT 38.2 30.9* 32.6* 31.5* 37.4  MCV 89.5 89.0 89.6 89.7 92.1  PLT 175 149* 192 193 332   BMP &GFR Recent Labs  Lab 07/11/23 1354 07/12/23 0222 07/13/23 1010 07/14/23 0110 07/16/23 0600  NA 132* 136 138 134* 137  K 3.2* 4.0 3.6 4.2 4.0  CL 96* 104 106 102 107  CO2 23 21* 23 23 21*  GLUCOSE 124* 93 102* 108* 100*  BUN 14 10 6* 8 9  CREATININE 0.79 0.65 0.76 0.71 0.73  CALCIUM 9.0 8.2* 8.5* 8.2* 8.7*  MG  --   --   1.7 2.1 2.1  PHOS  --   --  1.8* 5.2* 3.0   Estimated Creatinine Clearance: 51 mL/min (by C-G formula based on SCr of 0.73 mg/dL). Liver & Pancreas: Recent Labs  Lab 07/11/23 1354 07/13/23 1010 07/14/23 0110 07/16/23 0600  AST 20  --   --   --   ALT 16  --   --   --   ALKPHOS 58  --   --   --   BILITOT 0.7  --   --   --   PROT 7.3  --   --   --   ALBUMIN 3.3* 2.5* 2.4* 2.9*   No results for input(s): "LIPASE", "AMYLASE" in the last 168 hours. No results for input(s): "AMMONIA" in the last 168 hours. Diabetic: No results for input(s): "HGBA1C" in the last 72 hours.  Recent Labs  Lab 07/12/23 1149  GLUCAP 77   Cardiac Enzymes: No results for input(s): "CKTOTAL", "CKMB", "CKMBINDEX", "TROPONINI" in the last 168 hours. No results for input(s): "PROBNP" in the last 8760 hours. Coagulation Profile: Recent Labs  Lab 07/11/23 1354  INR 1.1   Thyroid Function Tests: No results for input(s): "TSH", "T4TOTAL", "FREET4", "T3FREE", "THYROIDAB" in the last 72 hours.  Lipid Profile: No results for input(s): "CHOL", "HDL", "LDLCALC", "TRIG", "CHOLHDL", "LDLDIRECT" in the last 72 hours. Anemia Panel: No results for input(s): "VITAMINB12", "FOLATE", "FERRITIN", "TIBC", "IRON", "RETICCTPCT" in the last 72 hours. Urine analysis:    Component Value Date/Time   COLORURINE YELLOW 07/11/2023 1448   APPEARANCEUR CLEAR 07/11/2023 1448   LABSPEC 1.006 07/11/2023 1448   PHURINE 6.0 07/11/2023 1448   GLUCOSEU NEGATIVE 07/11/2023 1448   HGBUR MODERATE (A) 07/11/2023 1448   BILIRUBINUR NEGATIVE 07/11/2023 1448   BILIRUBINUR neg 08/27/2022 1230   KETONESUR NEGATIVE 07/11/2023 1448   PROTEINUR 30 (A) 07/11/2023 1448   UROBILINOGEN 0.2 08/27/2022 1230   NITRITE NEGATIVE 07/11/2023 1448   LEUKOCYTESUR LARGE (A) 07/11/2023 1448   Sepsis Labs: Invalid input(s): "PROCALCITONIN", "LACTICIDVEN"  Microbiology: Recent Results (from the past 240 hour(s))  Blood Culture (routine x 2)      Status: None   Collection Time: 07/11/23  1:25 PM   Specimen:  BLOOD  Result Value Ref Range Status   Specimen Description BLOOD RIGHT ANTECUBITAL  Final   Special Requests   Final    BOTTLES DRAWN AEROBIC AND ANAEROBIC Blood Culture results may not be optimal due to an excessive volume of blood received in culture bottles   Culture   Final    NO GROWTH 5 DAYS Performed at [facility], [address] [place], [place] [address]    Report Status 07/16/2023 FINAL  Final  Blood Culture (routine x 2)     Status: None   Collection Time: 07/11/23  1:50 PM   Specimen: BLOOD  Result Value Ref Range Status   Specimen Description BLOOD RIGHT ANTECUBITAL  Final   Special Requests   Final    BOTTLES DRAWN AEROBIC AND ANAEROBIC Blood Culture results may not be optimal due to an excessive volume of blood received in culture bottles   Culture   Final    NO GROWTH 5 DAYS Performed at [facility], [address], [place], [place] [address]    Report Status 07/16/2023 FINAL  Final  Resp panel by RT-PCR (RSV, Flu A&B, Covid) Anterior Nasal Swab     Status: None   Collection Time: 07/11/23  1:54 PM   Specimen: Anterior Nasal Swab  Result Value Ref Range Status   SARS Coronavirus 2 by RT PCR NEGATIVE NEGATIVE Final   Influenza A by PCR NEGATIVE NEGATIVE Final   Influenza B by PCR NEGATIVE NEGATIVE Final    Comment: (NOTE) The Xpert Xpress SARS-CoV-2/FLU/RSV plus assay is intended as an aid in the diagnosis of influenza from Nasopharyngeal swab specimens and should not be used as a sole basis for treatment. Nasal washings and aspirates are unacceptable for Xpert Xpress SARS-CoV-2/FLU/RSV testing.  Fact Sheet for Patients: BloggerCourse.com  Fact Sheet for Healthcare Providers: SeriousBroker.it  This test is not yet approved or cleared by the [place] FDA and has been authorized for detection and/or diagnosis of SARS-CoV-2  by FDA under an Emergency Use Authorization (EUA). This EUA will remain in effect (meaning this test can be used) for the duration of the COVID-19 declaration under Section 564(b)(1) of the Act, 21 U.S.C. section 360bbb-3(b)(1), unless the authorization is terminated or revoked.     Resp Syncytial Virus by PCR NEGATIVE NEGATIVE Final    Comment: (NOTE) Fact Sheet for Patients: BloggerCourse.com  Fact Sheet for Healthcare Providers: SeriousBroker.it  This test is not yet approved or cleared by the [place] FDA and has been authorized for detection and/or diagnosis of SARS-CoV-2 by FDA under an Emergency Use Authorization (EUA). This EUA will remain in effect (meaning this test can be used) for the duration of the COVID-19 declaration under Section 564(b)(1) of the Act, 21 U.S.C. section 360bbb-3(b)(1), unless the authorization is terminated or revoked.  Performed at [facility], [address], [place], [place] [address]   Urine Culture     Status: Abnormal   Collection Time: 07/11/23  2:48 PM   Specimen: Urine, Clean Catch  Result Value Ref Range Status   Specimen Description URINE, CLEAN CATCH  Final   Special Requests   Final    NONE Reflexed from [id number] Performed at [facility], [address], [place], [place] [address]    Culture MULTIPLE SPECIES PRESENT, SUGGEST RECOLLECTION (A)  Final   Report Status 07/12/2023 FINAL  Final    Radiology Studies: No results found.    Priyah Schmuck T. [facility]  If 7PM-7AM, please contact night-coverage [email or web address] 07/16/2023, 3:03 PM

## 2023-07-16 NOTE — Plan of Care (Signed)

## 2023-07-17 DIAGNOSIS — R8290 Unspecified abnormal findings in urine: Secondary | ICD-10-CM | POA: Diagnosis not present

## 2023-07-17 DIAGNOSIS — L03315 Cellulitis of perineum: Secondary | ICD-10-CM | POA: Diagnosis not present

## 2023-07-17 DIAGNOSIS — E876 Hypokalemia: Secondary | ICD-10-CM | POA: Diagnosis not present

## 2023-07-17 DIAGNOSIS — E785 Hyperlipidemia, unspecified: Secondary | ICD-10-CM | POA: Diagnosis not present

## 2023-07-17 MED ORDER — ACETAMINOPHEN 325 MG PO TABS: 650.0000 mg | ORAL_TABLET | Freq: Four times a day (QID) | ORAL | Status: AC | PRN

## 2023-07-17 MED ORDER — PANTOPRAZOLE SODIUM 40 MG PO TBEC: 40.0000 mg | DELAYED_RELEASE_TABLET | Freq: Every day | ORAL | 0 refills | Status: DC

## 2023-07-17 MED ORDER — NYSTATIN 100000 UNIT/ML MT SUSP: 5.0000 mL | Freq: Four times a day (QID) | OROMUCOSAL | 0 refills | Status: DC

## 2023-07-17 MED ORDER — LEVOFLOXACIN 750 MG PO TABS: 750.0000 mg | ORAL_TABLET | Freq: Every day | ORAL | 0 refills | Status: AC

## 2023-07-17 MED ORDER — SODIUM CHLORIDE 0.9 % IV SOLN
1.0000 g | INTRAVENOUS | Status: DC
  Administered 2023-07-17: 1 g via INTRAVENOUS
  Filled 2023-07-17: qty 10

## 2023-07-17 MED ORDER — NAPROXEN 500 MG PO TABS: 500.0000 mg | ORAL_TABLET | Freq: Two times a day (BID) | ORAL | 0 refills | Status: AC

## 2023-07-17 MED ORDER — METRONIDAZOLE 500 MG/100ML IV SOLN
500.0000 mg | Freq: Two times a day (BID) | INTRAVENOUS | Status: DC
  Administered 2023-07-17: 500 mg via INTRAVENOUS
  Filled 2023-07-17: qty 100

## 2023-07-17 MED ORDER — METRONIDAZOLE 500 MG PO TABS: 500.0000 mg | ORAL_TABLET | Freq: Two times a day (BID) | ORAL | 0 refills | Status: AC

## 2023-07-17 NOTE — Plan of Care (Signed)
  Problem: Clinical Measurements: Goal: Ability to avoid or minimize complications of infection will improve Outcome: Adequate for Discharge   Problem: Skin Integrity: Goal: Skin integrity will improve Outcome: Adequate for Discharge   Problem: Education: Goal: Knowledge of General Education information will improve Description: Including pain rating scale, medication(s)/side effects and non-pharmacologic comfort measures Outcome: Adequate for Discharge   Problem: Health Behavior/Discharge Planning: Goal: Ability to manage health-related needs will improve Outcome: Adequate for Discharge   Problem: Clinical Measurements: Goal: Ability to maintain clinical measurements within normal limits will improve Outcome: Adequate for Discharge Goal: Will remain free from infection Outcome: Adequate for Discharge Goal: Diagnostic test results will improve Outcome: Adequate for Discharge Goal: Respiratory complications will improve Outcome: Adequate for Discharge Goal: Cardiovascular complication will be avoided Outcome: Adequate for Discharge   Problem: Activity: Goal: Risk for activity intolerance will decrease Outcome: Adequate for Discharge   Problem: Nutrition: Goal: Adequate nutrition will be maintained Outcome: Adequate for Discharge   Problem: Coping: Goal: Level of anxiety will decrease Outcome: Adequate for Discharge   Problem: Elimination: Goal: Will not experience complications related to bowel motility Outcome: Adequate for Discharge Goal: Will not experience complications related to urinary retention Outcome: Adequate for Discharge   Problem: Pain Managment: Goal: General experience of comfort will improve Outcome: Adequate for Discharge   Problem: Safety: Goal: Ability to remain free from injury will improve Outcome: Adequate for Discharge   Problem: Skin Integrity: Goal: Risk for impaired skin integrity will decrease Outcome: Adequate for Discharge   

## 2023-07-17 NOTE — Plan of Care (Signed)

## 2023-07-17 NOTE — Progress Notes (Signed)
GYN   Ms Pamela [name] reports feeling better today and is going home. Pain better controlled and she feels she is able to manage at home now  PE Deferred   CT Scan 8/13-8/16, no evidence of abscess, cellulitis.  A/P Vulvar/Labial cellulitis  Stable from an GYN stand point and OK for discharge home on oral antibiotics for a total of 14 day therapy. Pt has a Gyn appt with Dr Hyacinth Meeker on 07/27/23. Advised to keep.

## 2023-07-17 NOTE — Discharge Summary (Signed)
Physician Discharge Summary  Pamela [name] GEX:[id number] DOB: 02-Jul-1946 DOA: 07/11/2023  PCP: [name], MD  Admit date: 07/11/2023 Discharge date: 07/17/2023 Admitted From: Home Disposition: Home Recommendations for Outpatient Follow-up:  Follow up with PCP in 1 week Reassess cellulitis, CMP and CBC at follow-up Please follow up on the following pending results: None  Home Health: Not indicated Equipment/Devices: Not indicated  Discharge Condition: Stable CODE STATUS: Full code  Follow-up Information     Baxley, Luanna Cole, MD. Schedule an appointment as soon as possible for a visit in 1 week(s).   Specialty: Internal Medicine Contact information: [address] [place] [address] [phone]                 Hospital course 77 year old F with PMH of HTN, HLD, hypothyroidism and cystocele presenting with swelling and pain of right labia with associated general malaise, poor p.o. intake, fever, chills and nausea and vomiting.  Initially seen in ED on 8/10 and had bedside ultrasound that showed 2 x 2 cm fluid collection in the inferior portion of right labia majora for which she had bedside I&D with purulent drainage.  Culture was not sent.  She was discharged on p.o. doxycycline.  She had progressive swelling, pain and diaphoresis overnight that prompted her to return to ED.   In ED, stable vitals.  Afebrile.  WBC 10.5 with left shift.  Lactic acid negative.  K3.2.  CT abdomen and pelvis showed extensive soft tissue swelling and infiltration in subcutaneous fat  involving the right labia and extending into right perineal region consistent with cellulitis.  Urinalysis with pyuria. .  Blood and urine cultures sent.  Started on IV fluid and broad-spectrum antibiotics.  Gynecology following and did not feel there is need for I&D.  Patient continues to endorse significant pain.  Repeat CT pelvis on 8/16 with similar appearance of diffuse inflammatory change without  abscess.  Swelling and pain improved after starting NSAID.  De-escalated antibiotics to ceftriaxone and Flagyl on 8/17.  On the day of discharge, patient felt well and ready to go home and finish antibiotics at home.  Given penicillin allergy, she is discharged on p.o. Levaquin and Flagyl for 5 more days to complete a total of 12 days course.  Also advised to continue naproxen 500 mg twice daily for 5 days.  She was given Rx for Protonix for GI prophylaxis.  Recommended outpatient follow-up with PCP next week.  She already has upcoming appointment with gynecology.   See individual problem list below for more.   Problems addressed during this hospitalization Principal Problem:   Cellulitis Active Problems:   Abnormal urinalysis   Hypokalemia   Hyperlipidemia   Hypothyroidism   Cellulitis of the right labia with possible abscess: Presents with progressive symptoms and constitutional symptoms despite I&D and p.o. doxycycline outpatient.  No objective fever or significant leukocytosis yet.  Not diabetic.  A1c 5.8%.  Does not smoke.  Culture data as above.  Repeat CT without abscess.  Cellulitis improved after adding naproxen. -IV cefepime, vancomycin and Flagyl 8/12>> ceftriaxone and Flagyl 8/17>> p.o. Levaquin and Flagyl 8/19-8/23 -Continue naproxen 500 mg twice daily for 5 more days along as needed Tylenol -P.o. Protonix for GI prophylaxis -P.o. nystatin suspension for possible oral candidiasis -Outpatient follow-up with PCP and gynecology   Abnormal urinalysis/pyuria: Denies UTI symptoms.  Urine culture with multiple species. -Already on antibiotics as above   Normocytic anemia/thrombocytopenia: Likely dilutional.  Improved. -Recheck CBC at follow-up  Hypokalemia/hypophosphatemia/hypomagnesemia -Monitor replenish as appropriate   Hyperlipidemia -Continue simvastatin   Hypothyroidism: TSH low at 0.29.  No clinical signs of hypo or hyperthyroidism. -Continue levothyroxine. -Repeat  TSH in 4 to 6 weeks outpatient   Oral candidiasis: Noted some soreness and oral erythema.  Improved with nystatin. -Continue nystatin suspension while on antibiotics             Time spent 35 minutes  Vital signs Vitals:   07/16/23 1519 07/16/23 2037 07/17/23 0509 07/17/23 0749  BP: (!) 156/80 (!) 144/54 (!) 140/81 (!) 152/78  Pulse: 61 61 64 (!) 55  Temp: 98.1 F (36.7 C) 97.9 F (36.6 C) 98.1 F (36.7 C) 97.8 F (36.6 C)  Resp: 18 18 16 16   Height:      Weight:      SpO2: 98% 98% 96% 99%  TempSrc: Oral Oral Oral Oral  BMI (Calculated):         Discharge exam  GENERAL: No apparent distress.  Nontoxic. HEENT: MMM.  Vision and hearing grossly intact.  NECK: Supple.  No apparent JVD.  RESP:  No IWOB.  Fair aeration bilaterally. CVS:  RRR. Heart sounds normal.  ABD/GI/GU: BS+. Abd soft, NTND.  Significantly improved right labial/perineal cellulitis.  MSK/EXT:  Moves extremities. No apparent deformity. No edema.  SKIN: no apparent skin lesion or wound NEURO: Awake and alert. Oriented appropriately.  No apparent focal neuro deficit. PSYCH: Calm. Normal affect.   Patient's primary RN was not available.  Floyce Stakes  RN present for chaperone during GU exam.   Discharge Instructions Discharge Instructions     Diet general   Complete by: As directed    Discharge instructions   Complete by: As directed    It has been a pleasure taking care of you!  You were hospitalized due to cellulitis for which you have been treated with IV antibiotics.  We are discharging you on oral antibiotics to complete treatment course.  It is very important that you complete the whole course of antibiotics.  We have also started you on naproxen to help with inflammation and pain.  Please review your new medication list and the directions on your medications before you take them. Follow-up with your primary care doctor next week.   Take care,   Increase activity slowly   Complete by: As  directed       Allergies as of 07/17/2023       Reactions   Succinylcholine Other (See Comments)   BRADYCARDIA/  ASYSTOLE 1980's   Boniva [ibandronate Sodium]    GI   Morphine And Codeine Nausea And Vomiting   Penicillins Hives        Medication List     STOP taking these medications    doxycycline 100 MG capsule Commonly known as: VIBRAMYCIN       TAKE these medications    acetaminophen 325 MG tablet Commonly known as: Tylenol Take 2 tablets (650 mg total) by mouth every 6 (six) hours as needed for up to 10 days for mild pain, fever or headache. What changed:  medication strength how much to take reasons to take this   cholecalciferol 1000 units tablet Commonly known as: VITAMIN D Take 1,000 Units by mouth daily.   dorzolamide-timolol 2-0.5 % ophthalmic solution Commonly known as: COSOPT Place 1 drop into both eyes 2 (two) times daily.   levofloxacin 750 MG tablet Commonly known as: Levaquin Take 1 tablet (750 mg total) by mouth daily for 5 days. Start  taking on: July 18, 2023   levothyroxine 88 MCG tablet Commonly known as: SYNTHROID TAKE 1 TABLET(88 MCG) BY MOUTH DAILY   metroNIDAZOLE 500 MG tablet Commonly known as: Flagyl Take 1 tablet (500 mg total) by mouth 2 (two) times daily for 5 days.   naproxen 500 MG tablet Commonly known as: NAPROSYN Take 1 tablet (500 mg total) by mouth 2 (two) times daily with a meal for 5 days.   nystatin 100000 UNIT/ML suspension Commonly known as: MYCOSTATIN Use as directed 5 mLs (500,000 Units total) in the mouth or throat 4 (four) times daily.   pantoprazole 40 MG tablet Commonly known as: PROTONIX Take 1 tablet (40 mg total) by mouth daily. Start taking on: July 18, 2023   simvastatin 20 MG tablet Commonly known as: ZOCOR TAKE 1 TABLET(20 MG) BY MOUTH AT BEDTIME        Consultations: Gynecology  Procedures/Studies:   CT PELVIS W CONTRAST  Result Date: 07/15/2023 CLINICAL DATA:  Soft  tissue infection suspected, pelvis. EXAM: CT PELVIS WITH CONTRAST TECHNIQUE: Multidetector CT imaging of the pelvis was performed using the standard protocol following the bolus administration of intravenous contrast. RADIATION DOSE REDUCTION: This exam was performed according to the departmental dose-optimization program which includes automated exposure control, adjustment of the mA and/or kV according to patient size and/or use of iterative reconstruction technique. CONTRAST:  75mL OMNIPAQUE IOHEXOL 350 MG/ML SOLN COMPARISON:  CT pelvis 07/10/2013 FINDINGS: Urinary Tract: Urinary bladder and distal ureters are within normal limits. Bowel:  Visualized bowel is unremarkable. Vascular/Lymphatic: Atherosclerotic changes are present in the distal aorta and branch vessels. No aneurysm or focal stenosis is present. Prominent right groin and inguinal lymph nodes are again noted. Reproductive: No mass or other significant abnormality pessary is in place. Other: The similar appearance of diffuse inflammatory changes involving the right greater than left labia and extending posteriorly to the right perineum. No discrete fluid collection or gas is present in the tissues. No intra-abdominal extension of inflammatory changes present. Musculoskeletal: The hips are located bilaterally. No focal osseous lesions are present. IMPRESSION: 1. Similar appearance of diffuse inflammatory changes involving the right greater than left labia and extending posteriorly to the right perineum consistent with cellulitis. 2. No discrete fluid collection or gas in the soft tissues. 3. Prominent right groin and inguinal lymph nodes are again noted, likely reactive. 4.  Aortic Atherosclerosis (ICD10-I70.0). Electronically Signed   By: Marin Roberts M.D.   On: 07/15/2023 16:25   CT PELVIS W CONTRAST  Result Date: 07/11/2023 CLINICAL DATA:  Perianal abscess or fistula suspected. Labial abscess. EXAM: CT PELVIS WITH CONTRAST TECHNIQUE:  Multidetector CT imaging of the pelvis was performed using the standard protocol following the bolus administration of intravenous contrast. RADIATION DOSE REDUCTION: This exam was performed according to the departmental dose-optimization program which includes automated exposure control, adjustment of the mA and/or kV according to patient size and/or use of iterative reconstruction technique. CONTRAST:  75mL OMNIPAQUE IOHEXOL 350 MG/ML SOLN COMPARISON:  CT abdomen and pelvis 07/18/2013 FINDINGS: Urinary Tract: Normal appearance of the bladder. No wall thickening, stone, or filling defect. Distal ureters are not dilated. Bowel: Visualized portions of small and large bowel are not abnormally distended. No wall thickening or inflammatory changes are seen. Appendix is normal. There is an anastomosis at the rectosigmoid:. Vascular/Lymphatic: Calcification of the aorta and iliac arteries. No aneurysm. Reproductive: Uterus and ovaries are not enlarged. No abnormal adnexal masses are seen. A vaginal pessary is in place. Other:  Diffuse infiltration and edema in the soft tissues of the right side of the labia extending inferiorly into the right perineum and superiorly into the anterior subcutaneous fat over the pubic symphysis. There is mild skin thickening. Changes are most consistent with cellulitis. No discrete loculated collection is identified. No soft tissue gas is identified. Enlarged lymph nodes are present in the right groin and right pelvis, likely reactive. Musculoskeletal: Degenerative changes in the lower lumbar spine and hips. No destructive bone lesions appreciated. IMPRESSION: 1. Extensive soft tissue swelling and infiltration in the subcutaneous fat involving the right labia and extending into the right perineal region and right groin/symphysis pubis subcutaneous fat. Changes are most consistent with cellulitis. No loculated collection. No soft tissue gas. 2. Aortic atherosclerosis. 3. Prominent right groin  and pelvic lymph nodes are likely reactive. Electronically Signed   By: Burman Nieves M.D.   On: 07/11/2023 15:39       The results of significant diagnostics from this hospitalization (including imaging, microbiology, ancillary and laboratory) are listed below for reference.     Microbiology: Recent Results (from the past 240 hour(s))  Blood Culture (routine x 2)     Status: None   Collection Time: 07/11/23  1:25 PM   Specimen: BLOOD  Result Value Ref Range Status   Specimen Description BLOOD RIGHT ANTECUBITAL  Final   Special Requests   Final    BOTTLES DRAWN AEROBIC AND ANAEROBIC Blood Culture results may not be optimal due to an excessive volume of blood received in culture bottles   Culture   Final    NO GROWTH 5 DAYS Performed at [facility], [address], [place], [place] [address]    Report Status 07/16/2023 FINAL  Final  Blood Culture (routine x 2)     Status: None   Collection Time: 07/11/23  1:50 PM   Specimen: BLOOD  Result Value Ref Range Status   Specimen Description BLOOD RIGHT ANTECUBITAL  Final   Special Requests   Final    BOTTLES DRAWN AEROBIC AND ANAEROBIC Blood Culture results may not be optimal due to an excessive volume of blood received in culture bottles   Culture   Final    NO GROWTH 5 DAYS Performed at [facility], [address], [place], [place] [address]    Report Status 07/16/2023 FINAL  Final  Resp panel by RT-PCR (RSV, Flu A&B, Covid) Anterior Nasal Swab     Status: None   Collection Time: 07/11/23  1:54 PM   Specimen: Anterior Nasal Swab  Result Value Ref Range Status   SARS Coronavirus 2 by RT PCR NEGATIVE NEGATIVE Final   Influenza A by PCR NEGATIVE NEGATIVE Final   Influenza B by PCR NEGATIVE NEGATIVE Final    Comment: (NOTE) The Xpert Xpress SARS-CoV-2/FLU/RSV plus assay is intended as an aid in the diagnosis of influenza from Nasopharyngeal swab specimens and should not be used as a sole basis for treatment. Nasal  washings and aspirates are unacceptable for Xpert Xpress SARS-CoV-2/FLU/RSV testing.  Fact Sheet for Patients: BloggerCourse.com  Fact Sheet for Healthcare Providers: SeriousBroker.it  This test is not yet approved or cleared by the [place] FDA and has been authorized for detection and/or diagnosis of SARS-CoV-2 by FDA under an Emergency Use Authorization (EUA). This EUA will remain in effect (meaning this test can be used) for the duration of the COVID-19 declaration under Section 564(b)(1) of the Act, 21 U.S.C. section 360bbb-3(b)(1), unless the authorization is terminated  or revoked.     Resp Syncytial Virus by PCR NEGATIVE NEGATIVE Final    Comment: (NOTE) Fact Sheet for Patients: BloggerCourse.com  Fact Sheet for Healthcare Providers: SeriousBroker.it  This test is not yet approved or cleared by the [place] FDA and has been authorized for detection and/or diagnosis of SARS-CoV-2 by FDA under an Emergency Use Authorization (EUA). This EUA will remain in effect (meaning this test can be used) for the duration of the COVID-19 declaration under Section 564(b)(1) of the Act, 21 U.S.C. section 360bbb-3(b)(1), unless the authorization is terminated or revoked.  Performed at [facility], [address] [place], [place] [address]   Urine Culture     Status: Abnormal   Collection Time: 07/11/23  2:48 PM   Specimen: Urine, Clean Catch  Result Value Ref Range Status   Specimen Description URINE, CLEAN CATCH  Final   Special Requests   Final    NONE Reflexed from [id number] Performed at [facility], [address], [place], [place] [address]    Culture MULTIPLE SPECIES PRESENT, SUGGEST RECOLLECTION (A)  Final   Report Status 07/12/2023 FINAL  Final     Labs:  CBC: Recent Labs  Lab 07/11/23 1354 07/12/23 0222 07/13/23 1010 07/14/23 0110  07/16/23 0600  WBC 10.5 7.9 8.1 7.8 10.2  NEUTROABS 8.8*  --   --   --   --   HGB 13.1 10.7* 11.2* 10.5* 12.4  HCT 38.2 30.9* 32.6* 31.5* 37.4  MCV 89.5 89.0 89.6 89.7 92.1  PLT 175 149* 192 193 332   BMP &GFR Recent Labs  Lab 07/11/23 1354 07/12/23 0222 07/13/23 1010 07/14/23 0110 07/16/23 0600  NA 132* 136 138 134* 137  K 3.2* 4.0 3.6 4.2 4.0  CL 96* 104 106 102 107  CO2 23 21* 23 23 21*  GLUCOSE 124* 93 102* 108* 100*  BUN 14 10 6* 8 9  CREATININE 0.79 0.65 0.76 0.71 0.73  CALCIUM 9.0 8.2* 8.5* 8.2* 8.7*  MG  --   --  1.7 2.1 2.1  PHOS  --   --  1.8* 5.2* 3.0   Estimated Creatinine Clearance: 51 mL/min (by C-G formula based on SCr of 0.73 mg/dL). Liver & Pancreas: Recent Labs  Lab 07/11/23 1354 07/13/23 1010 07/14/23 0110 07/16/23 0600  AST 20  --   --   --   ALT 16  --   --   --   ALKPHOS 58  --   --   --   BILITOT 0.7  --   --   --   PROT 7.3  --   --   --   ALBUMIN 3.3* 2.5* 2.4* 2.9*   No results for input(s): "LIPASE", "AMYLASE" in the last 168 hours. No results for input(s): "AMMONIA" in the last 168 hours. Diabetic: No results for input(s): "HGBA1C" in the last 72 hours. Recent Labs  Lab 07/12/23 1149  GLUCAP 77   Cardiac Enzymes: No results for input(s): "CKTOTAL", "CKMB", "CKMBINDEX", "TROPONINI" in the last 168 hours. No results for input(s): "PROBNP" in the last 8760 hours. Coagulation Profile: Recent Labs  Lab 07/11/23 1354  INR 1.1   Thyroid Function Tests: No results for input(s): "TSH", "T4TOTAL", "FREET4", "T3FREE", "THYROIDAB" in the last 72 hours. Lipid Profile: No results for input(s): "CHOL", "HDL", "LDLCALC", "TRIG", "CHOLHDL", "LDLDIRECT" in the last 72 hours. Anemia Panel: No results for input(s): "VITAMINB12", "FOLATE", "FERRITIN", "TIBC", "IRON", "RETICCTPCT" in the last 72 hours. Urine analysis:  Component Value Date/Time   COLORURINE YELLOW 07/11/2023 1448   APPEARANCEUR CLEAR 07/11/2023 1448   LABSPEC 1.006  07/11/2023 1448   PHURINE 6.0 07/11/2023 1448   GLUCOSEU NEGATIVE 07/11/2023 1448   HGBUR MODERATE (A) 07/11/2023 1448   BILIRUBINUR NEGATIVE 07/11/2023 1448   BILIRUBINUR neg 08/27/2022 1230   KETONESUR NEGATIVE 07/11/2023 1448   PROTEINUR 30 (A) 07/11/2023 1448   UROBILINOGEN 0.2 08/27/2022 1230   NITRITE NEGATIVE 07/11/2023 1448   LEUKOCYTESUR LARGE (A) 07/11/2023 1448   Sepsis Labs: Invalid input(s): "PROCALCITONIN", "LACTICIDVEN"   SIGNED:  Almon Hercules, MD  [facility] 07/17/2023, 4:02 PM

## 2023-07-18 ENCOUNTER — Telehealth: Payer: Self-pay

## 2023-07-18 NOTE — Transitions of Care (Post Inpatient/ED Visit) (Unsigned)
   07/18/2023  Name: Pamela [name] MRN: [id number] DOB: June 20, 1946  Today's TOC FU Call Status: Today's TOC FU Call Status:: Unsuccessful Call (1st Attempt) Unsuccessful Call (1st Attempt) Date: 07/18/23  Attempted to reach the patient regarding the most recent Inpatient/ED visit.  Follow Up Plan: Additional outreach attempts will be made to reach the patient to complete the Transitions of Care (Post Inpatient/ED visit) call.   Signature   Woodfin Ganja LPN [facility] Nurse Health Advisor Direct Dial [phone]

## 2023-07-20 NOTE — Transitions of Care (Post Inpatient/ED Visit) (Signed)
   07/20/2023  Name: NEVAE FRIGON MRN: [id number] DOB: 1946-09-05  Today's TOC FU Call Status: Today's TOC FU Call Status:: Unsuccessful Call (2nd Attempt) Unsuccessful Call (1st Attempt) Date: 07/18/23 Unsuccessful Call (2nd Attempt) Date: 07/20/23  Attempted to reach the patient regarding the most recent Inpatient/ED visit.  Follow Up Plan: No further outreach attempts will be made at this time. We have been unable to contact the patient. Pt has fu appt 07-21-23 at  noon  Signature  Woodfin Ganja LPN [facility] Nurse Health Advisor Direct Dial [phone]

## 2023-07-21 ENCOUNTER — Other Ambulatory Visit: Payer: Medicare Other

## 2023-07-21 ENCOUNTER — Other Ambulatory Visit: Payer: Self-pay | Admitting: Internal Medicine

## 2023-07-21 ENCOUNTER — Encounter: Payer: Self-pay | Admitting: Internal Medicine

## 2023-07-21 ENCOUNTER — Ambulatory Visit (INDEPENDENT_AMBULATORY_CARE_PROVIDER_SITE_OTHER): Payer: Medicare Other | Admitting: Internal Medicine

## 2023-07-21 VITALS — BP 120/80 | HR 70 | Ht 62.0 in | Wt 137.0 lb

## 2023-07-21 DIAGNOSIS — N762 Acute vulvitis: Secondary | ICD-10-CM | POA: Diagnosis not present

## 2023-07-21 DIAGNOSIS — E039 Hypothyroidism, unspecified: Secondary | ICD-10-CM

## 2023-07-21 DIAGNOSIS — L0390 Cellulitis, unspecified: Secondary | ICD-10-CM | POA: Diagnosis not present

## 2023-07-21 DIAGNOSIS — Z9049 Acquired absence of other specified parts of digestive tract: Secondary | ICD-10-CM

## 2023-07-21 DIAGNOSIS — E7800 Pure hypercholesterolemia, unspecified: Secondary | ICD-10-CM

## 2023-07-21 MED ORDER — FLUCONAZOLE 150 MG PO TABS: 150.0000 mg | ORAL_TABLET | Freq: Once | ORAL | 1 refills | Status: AC

## 2023-07-21 NOTE — Progress Notes (Signed)
Patient Care Team: [name], MD as PCP - General (Internal Medicine) [name], MD (Inactive) as Consulting Physician (Gastroenterology) Karie Soda, MD as Consulting Physician (General Surgery)  Visit Date: 07/21/23  Subjective:    Patient ID: Pamela [name] , Female   DOB: January 21, 1946, 77 y.o.    MRN: [id number]   77 y.o. Female presents today for a hospital/ED follow-up. She was seen in the ED on 07/09/23. Bedside ultrasound showed 2 x 2 cm fluid collection in the inferior portion of right labia majora for which she had bedside I&D with purulent drainage. She was discharged on p.o. doxycycline. She had progressive swelling, pain and diaphoresis overnight that prompted her to return to ED. Upon return to ED, WBC 10.5 with left shift. Lactic acid negative. K 3.2. 07/11/23 CT abdomen and pelvis showed extensive soft tissue swelling and infiltration in subcutaneous fat involving the right labia and extending into right perineal region consistent with cellulitis. Urinalysis with pyuria. Blood and urine cultures sent. Started on IV fluid and broad-spectrum antibiotics. Admitted on 07/11/23. Repeat CT pelvis on 07/15/23 with similar appearance of diffuse inflammatory change without abscess. Swelling and pain improved after starting NSAID. Antibiotics de-escalated to ceftriaxone and Flagyl on 07/16/23. Upon discharge on 07/17/23, she was given Levaquin and Flagyl for 5 more days to complete 12 day course, advised to continue naproxen 500 mg twice daily for 5 days, Protonix prescription for GI prophylaxis.   On the day of symptom onset, 07/07/23, [facility] and started feeling ill by that night. She had a sebaceous cyst on her left labia that had cleared prior to this. She has been having serosanguinous vaginal seepage after leaving the hospital. Believes she has stomatitis in her mouth. She sees her gynecologist 07/27/23.   Past Medical History:  Diagnosis Date   Colon  polyps    Complication of anesthesia 1980's   laparoscopic procedure invitro, bradycardia and heart stopped [facility]   Diverticulitis    Diverticulosis    Fibrocystic breast disease    Hyperlipidemia    Hypertension    elevated bp in past, no current meds   Hypothyroidism    Osteopenia    Tubal ectopic pregnancy    x 3   Vaginal atrophy    Vitamin D deficiency      Family History  Problem Relation Age of Onset   Colon cancer Paternal Uncle    Cancer Paternal Uncle        Colon   Bladder Cancer Paternal Uncle    Cancer Paternal Uncle        Prostate/Bladder   Diabetes Father    Heart disease Father    Stroke Father    Hypertension Mother    Cancer Mother        Lung   Prostate cancer Paternal Uncle    Skin cancer Paternal Uncle    Diabetes Other        father's side juvenile and adult onset   Cancer Paternal Grandfather        Skin/Stomach    Social History   Social History Narrative   Not on file      Review of Systems  Constitutional:  Negative for fever and malaise/fatigue.  HENT:  Negative for congestion.        (+) Stomatitis  Eyes:  Negative for blurred vision.  Respiratory:  Negative for cough and shortness of breath.   Cardiovascular:  Negative for chest  pain, palpitations and leg swelling.  Gastrointestinal:  Negative for vomiting.  Genitourinary:        (+) Vaginal drainage  Musculoskeletal:  Negative for back pain.  Skin:  Negative for rash.  Neurological:  Negative for loss of consciousness and headaches.        Objective:   Vitals: BP 120/80   Pulse 70   Ht 5\' 2"  (1.575 m)   Wt 137 lb (62.1 kg)   SpO2 97%   BMI 25.06 kg/m    Physical Exam Vitals and nursing note reviewed.  Constitutional:      General: She is not in acute distress.    Appearance: Normal appearance. She is not toxic-appearing.  HENT:     Head: Normocephalic and atraumatic.  Pulmonary:     Effort: Pulmonary effort is normal.  Genitourinary:    Comments:  Right labia majora erythematous, indurated. This area is tender. No fluid expressed from labia. Skin:    General: Skin is warm and dry.  Neurological:     Mental Status: She is alert and oriented to person, place, and time. Mental status is at baseline.  Psychiatric:        Mood and Affect: Mood normal.        Behavior: Behavior normal.        Thought Content: Thought content normal.        Judgment: Judgment normal.       Results:   Studies obtained and personally reviewed by me:  07/11/23 CT abdomen and pelvis showed extensive soft tissue swelling and infiltration in subcutaneous fat involving the right labia and extending into right perineal region consistent with cellulitis.   Repeat CT pelvis on 07/15/23 with similar appearance of diffuse inflammatory change without abscess.  Labs:       Component Value Date/Time   NA 137 07/16/2023 0600   K 4.0 07/16/2023 0600   CL 107 07/16/2023 0600   CO2 21 (L) 07/16/2023 0600   GLUCOSE 100 (H) 07/16/2023 0600   BUN 9 07/16/2023 0600   CREATININE 0.73 07/16/2023 0600   CREATININE 0.78 08/24/2022 0939   CALCIUM 8.7 (L) 07/16/2023 0600   PROT 7.3 07/11/2023 1354   ALBUMIN 2.9 (L) 07/16/2023 0600   AST 20 07/11/2023 1354   ALT 16 07/11/2023 1354   ALKPHOS 58 07/11/2023 1354   BILITOT 0.7 07/11/2023 1354   GFRNONAA >60 07/16/2023 0600   GFRNONAA 81 07/21/2020 1202   GFRAA 94 07/21/2020 1202     Lab Results  Component Value Date   WBC 10.2 07/16/2023   HGB 12.4 07/16/2023   HCT 37.4 07/16/2023   MCV 92.1 07/16/2023   PLT 332 07/16/2023    Lab Results  Component Value Date   CHOL 204 (H) 08/24/2022   HDL 68 08/24/2022   LDLCALC 108 (H) 08/24/2022   TRIG 168 (H) 08/24/2022   CHOLHDL 3.0 08/24/2022    Lab Results  Component Value Date   HGBA1C 5.8 (H) 07/13/2023     Lab Results  Component Value Date   TSH 0.290 (L) 07/11/2023      Assessment & Plan:   Cellulitis Right Labia Majora: Requiring hospitalization  and treatment with Levaquin and Flagyl for 5 days to complete a 12-day course of antibiotics.  Initially was seen in emergency department August 10 and had ultrasound showing 2 x 2 cm fluid collection in the inferior portion of right labia majora for which I&D was done at bedside.  Culture was not sent.  She was discharged from ED with p.o. doxycycline but had progressive symptoms and return to the emergency department and was admitted  History of Candida vaginitis (currently asymptomatic)-have prescribed Diflucan 150 mg tablet to take once if needed for Candida vaginitis while on antibiotics per patient request.  She still has firmness in right labia majora area with some mild tenderness.  Is to see Dr. Leda Quail, GYN physician on August 30.  Have ordered CBC, CMET as requested by Hospitalist, to be sent to Gynecologist.  Vaccine counseling: she plans to get flu, Covid-19 updates at the pharmacy.    I,Alexander Ruley,acting as a Neurosurgeon for [name], MD.,have documented all relevant documentation on the behalf of [name], MD,as directed by  [name], MD while in the presence of [name], MD.   I, [name], MD, have reviewed all documentation for this visit. The documentation on 07/31/23 for the exam, diagnosis, procedures, and orders are all accurate and complete.

## 2023-07-22 LAB — CBC WITH DIFFERENTIAL/PLATELET
Absolute Monocytes: 496 {cells}/uL (ref 200–950)
Basophils Absolute: 29 {cells}/uL (ref 0–200)
Basophils Relative: 0.4 %
Eosinophils Absolute: 51 {cells}/uL (ref 15–500)
Eosinophils Relative: 0.7 %
HCT: 37 % (ref 35.0–45.0)
Hemoglobin: 12.3 g/dL (ref 11.7–15.5)
Lymphs Abs: 1832 {cells}/uL (ref 850–3900)
MCH: 30.4 pg (ref 27.0–33.0)
MCHC: 33.2 g/dL (ref 32.0–36.0)
MCV: 91.4 fL (ref 80.0–100.0)
MPV: 12 fL (ref 7.5–12.5)
Monocytes Relative: 6.8 %
Neutro Abs: 4891 {cells}/uL (ref 1500–7800)
Neutrophils Relative %: 67 %
Platelets: 445 10*3/uL — ABNORMAL HIGH (ref 140–400)
RBC: 4.05 10*6/uL (ref 3.80–5.10)
RDW: 13.1 % (ref 11.0–15.0)
Total Lymphocyte: 25.1 %
WBC: 7.3 10*3/uL (ref 3.8–10.8)

## 2023-07-22 LAB — COMPLETE METABOLIC PANEL WITH GFR
AG Ratio: 1.6 (calc) (ref 1.0–2.5)
ALT: 28 U/L (ref 6–29)
AST: 17 U/L (ref 10–35)
Albumin: 3.9 g/dL (ref 3.6–5.1)
Alkaline phosphatase (APISO): 56 U/L (ref 37–153)
BUN: 16 mg/dL (ref 7–25)
CO2: 20 mmol/L (ref 20–32)
Calcium: 9.2 mg/dL (ref 8.6–10.4)
Chloride: 108 mmol/L (ref 98–110)
Creat: 0.76 mg/dL (ref 0.60–1.00)
Globulin: 2.5 g/dL (ref 1.9–3.7)
Glucose, Bld: 90 mg/dL (ref 65–99)
Potassium: 5 mmol/L (ref 3.5–5.3)
Sodium: 138 mmol/L (ref 135–146)
Total Bilirubin: 0.3 mg/dL (ref 0.2–1.2)
Total Protein: 6.4 g/dL (ref 6.1–8.1)
eGFR: 82 mL/min/{1.73_m2} (ref >=60)

## 2023-07-27 ENCOUNTER — Ambulatory Visit (HOSPITAL_BASED_OUTPATIENT_CLINIC_OR_DEPARTMENT_OTHER): Payer: Medicare Other | Admitting: Obstetrics & Gynecology

## 2023-07-29 ENCOUNTER — Encounter (HOSPITAL_BASED_OUTPATIENT_CLINIC_OR_DEPARTMENT_OTHER): Payer: Self-pay | Admitting: Obstetrics & Gynecology

## 2023-07-29 ENCOUNTER — Ambulatory Visit (HOSPITAL_BASED_OUTPATIENT_CLINIC_OR_DEPARTMENT_OTHER): Payer: Medicare Other | Admitting: Obstetrics & Gynecology

## 2023-07-29 VITALS — BP 132/78 | HR 46 | Ht 61.0 in | Wt 134.6 lb

## 2023-07-29 DIAGNOSIS — Z4689 Encounter for fitting and adjustment of other specified devices: Secondary | ICD-10-CM | POA: Diagnosis not present

## 2023-07-29 DIAGNOSIS — N8110 Cystocele, unspecified: Secondary | ICD-10-CM | POA: Diagnosis not present

## 2023-07-29 DIAGNOSIS — N762 Acute vulvitis: Secondary | ICD-10-CM

## 2023-07-31 NOTE — Patient Instructions (Signed)
Patient will be seeing Dr. Hyacinth Meeker on August 28.  Have drawn CBC and c-Met as requested by hospitalist at discharge.  May take Diflucan if needed for Candida vaginitis.

## 2023-08-01 NOTE — Progress Notes (Signed)
GYNECOLOGY  VISIT  CC:   hospital follow up, cellulitis  HPI: 77 y.o. G33P0030 Married White or Caucasian female here for hospital follow up after having a very significant vulvar cellulitis with small abscess where she was initially seen in the ER.  I&D performed.  No culture sent.  Pt went back to the ER 2 days later with worsening symptoms, pain and then was hospitalized for about a week on IV antibiotics.  CT scans done x 2 to ensure no abscess.  WBC ct was never elevated.  There was just extensive inflammatory changes.  It does appear this was very extensive.  Hospital notes, labs, imaging reviewed.   She reports it is better but has been advised the induration will take weeks to resolve.  She is using hot showers and warm compresses.  Tried Sitz bath but it doesn't fit well on her toilet.  Off antibiotics now.  No fevers.  Pain is much better.    Pt does have a pessary and this has been working well for her.  Has not been removed in more than 3 months.  No vaginal discharge.  Past Medical History:  Diagnosis Date   Colon polyps    Complication of anesthesia 1980's   laparoscopic procedure invitro, [facility] and heart stopped [facility]   Diverticulitis    Diverticulosis    Fibrocystic breast disease    Hyperlipidemia    Hypertension    elevated bp in past, no current meds   Hypothyroidism    Osteopenia    Tubal ectopic pregnancy    x 3   Vaginal atrophy    Vitamin D deficiency     MEDS:   Current Outpatient Medications on File Prior to Visit  Medication Sig Dispense Refill   cholecalciferol (VITAMIN D) 1000 UNITS tablet Take 1,000 Units by mouth daily.     dorzolamide-timolol (COSOPT) 22.3-6.8 MG/ML ophthalmic solution Place 1 drop into both eyes 2 (two) times daily.     levothyroxine (SYNTHROID) 88 MCG tablet TAKE 1 TABLET(88 MCG) BY MOUTH DAILY 90 tablet 0   nystatin (MYCOSTATIN) 100000 UNIT/ML suspension Use as directed 5 mLs (500,000 Units total) in the mouth or throat 4  (four) times daily. 60 mL 0   pantoprazole (PROTONIX) 40 MG tablet Take 1 tablet (40 mg total) by mouth daily. 30 tablet 0   simvastatin (ZOCOR) 20 MG tablet TAKE 1 TABLET(20 MG) BY MOUTH AT BEDTIME 90 tablet 3   No current facility-administered medications on file prior to visit.    ALLERGIES: Succinylcholine, Boniva [ibandronate sodium], Morphine and codeine, and Penicillins  SH:  married, non smoker  Review of Systems  Constitutional: Negative.   Genitourinary:        Right labial enlargement/tenderness    PHYSICAL EXAMINATION:    BP 132/78 (BP Location: Left Arm, Patient Position: Sitting, Cuff Size: Normal)   Pulse (!) 46   Ht 5\' 1"  (1.549 m) Comment: Reported  Wt 134 lb 9.6 oz (61.1 kg)   BMI 25.43 kg/m     General appearance: alert, cooperative and appears stated age Lymph:  no inguinal LAD noted  Pelvic: External genitalia:  enlarged right labia from about mid region up to mons and out laterally with induration.  One possible small area concerning for area that could drain.    Discussed with pt making sure this was no abscess behind this small area.  Pt consented to let me see if there was any drainage.  Area cleansed with betadine x  3.  Small opening made but no drainage noted.  Small amount of bleeding present.  Pt tolerated this well.  Pessary then removed and cleansed.  Vaginal mucosa atrophic but no lesions, pressure areas, discharge.  Pessary replaced without difficulty        Chaperone, Ina Homes, CMA, was present for exam.  Assessment/Plan: 1. Vulvar cellulitis - completed antibiotics.  Continue to monitor and use warm compresses.  Activity recommended.  Will plan to recheck 2 weeks.   2. 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 Pessary maintenance   Total time with pt in office: 37 minutes

## 2023-08-08 ENCOUNTER — Encounter (HOSPITAL_BASED_OUTPATIENT_CLINIC_OR_DEPARTMENT_OTHER): Payer: Self-pay | Admitting: Obstetrics & Gynecology

## 2023-08-08 ENCOUNTER — Ambulatory Visit (HOSPITAL_BASED_OUTPATIENT_CLINIC_OR_DEPARTMENT_OTHER): Payer: Medicare Other | Admitting: Obstetrics & Gynecology

## 2023-08-08 VITALS — BP 141/88 | HR 57 | Ht 61.0 in | Wt 136.0 lb

## 2023-08-08 DIAGNOSIS — L03315 Cellulitis of perineum: Secondary | ICD-10-CM | POA: Diagnosis not present

## 2023-08-08 NOTE — Progress Notes (Unsigned)
GYNECOLOGY  VISIT  CC:   cellulitis recheck  HPI: 77 y.o. G67P0030 Married White or Caucasian female here for vulvar recheck.  Pt has upcoming travels and is here for recheck.  Reports firmness is still present but every day it seems to get a little better and a little better.  Denies drainage from incision and no fevers. Overall, pain is much better as well.   Past Medical History:  Diagnosis Date   Colon polyps    Complication of anesthesia 1980's   laparoscopic procedure invitro, [facility]ady[facility] and heart [facility]   Diverticulitis    Diverticulosis    Fibrocystic breast disease    Hyperlipidemia    Hypertension    elevated bp in past, no current meds   Hypothyroidism    Osteopenia    Tubal ectopic pregnancy    x 3   Vaginal atrophy    Vitamin D deficiency     MEDS:   Current Outpatient Medications on File Prior to Visit  Medication Sig Dispense Refill   cholecalciferol (VITAMIN D) 1000 UNITS tablet Take 1,000 Units by mouth daily.     dorzolamide-timolol (COSOPT) 22.3-6.8 MG/ML ophthalmic solution Place 1 drop into both eyes 2 (two) times daily.     levothyroxine (SYNTHROID) 88 MCG tablet TAKE 1 TABLET(88 MCG) BY MOUTH DAILY 90 tablet 0   nystatin (MYCOSTATIN) 100000 UNIT/ML suspension Use as directed 5 mLs (500,000 Units total) in the mouth or throat 4 (four) times daily. 60 mL 0   pantoprazole (PROTONIX) 40 MG tablet Take 1 tablet (40 mg total) by mouth daily. 30 tablet 0   simvastatin (ZOCOR) 20 MG tablet TAKE 1 TABLET(20 MG) BY MOUTH AT BEDTIME 90 tablet 3   No current facility-administered medications on file prior to visit.    ALLERGIES: Succinylcholine, Boniva [ibandronate sodium], Morphine and codeine, and Penicillins  SH:  married, non smoker  Review of Systems  Constitutional: Negative.   Genitourinary: Negative.     PHYSICAL EXAMINATION:    BP (!) 141/102 (BP Location: Left Arm, Patient Position: Sitting, Cuff Size: Normal)   Pulse (!) 57   Ht 5\' 1"   (1.549 m)   Wt 136 lb (61.7 kg)   BMI 25.70 kg/m     General appearance: alert, cooperative and appears stated age Lymph:  no inguinal LAD noted  Pelvic: External genitalia: induration along middle portion of right labia majora up towards mons pubis, erythema is improved, induration is less although still present, less tender to palpation as well              Urethra:  normal appearing urethra with no masses, tenderness or lesions              Bartholins and Skenes: normal     Assessment/Plan: 1. Cellulitis of perineum - improvement in physical exam noted today.  Pt understands this will take at least another 4 weeks for resolution.  She is to call with any new concerns.

## 2023-08-09 ENCOUNTER — Encounter (HOSPITAL_BASED_OUTPATIENT_CLINIC_OR_DEPARTMENT_OTHER): Payer: Self-pay | Admitting: Obstetrics & Gynecology

## 2023-08-23 NOTE — Progress Notes (Shared)
Annual Wellness Visit    Patient Care Team: Baxley, Luanna Cole, MD as PCP - General (Internal Medicine) [name], MD (Inactive) as Consulting Physician (Gastroenterology) Karie Soda, MD as Consulting Physician (General Surgery)  Visit Date: 08/23/23   No chief complaint on file.   Subjective:   Patient: Pamela [name], Female    DOB: Apr 23, 1946, 77 y.o.   MRN: [id number]  Pamela [name] is a 77 y.o. Female who presents today for her Annual Wellness Visit. History of diverticulosis, fibrocystic breast disease, hyperlipidemia, hypertension, hypothyroidism, osteopenia, Vitamin D deficiency.  History of hyperlipidemia treated with simvastatin 20 mg at bedtime.  History of GERD treated with pantoprazole 40 mg daily.  History of hypothyroidism treated with levothyroxine 88 mcg daily.  Has keratosis right scalp.  Will be seeing dermatologist soon.  History of insomnia treated with Ambien.   History of left wrist fracture 1997 and had to be repaired surgically.   History of fibrocystic breast disease, diverticulosis of the colon status post laparoscopic colon resection.   Patient had tonsillectomy in the 1950s.  In November 07, 1993, she had a tubal repair laparotomy.  She had 3 ectopic pregnancies 1992, 1993 1995 age resulting in laparotomy.  Mammogram last completed 12/01/22. No mammographic evidence for malignancy. Repeat recommended in 2024/11/07.  Had colonoscopy in 2012-11-07 and repeat study is due.  Had bone density study April 2024 with lowest score -2.1.  Has not wanted to be on bone sparing therapy.   Social history: She is retired.  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  She is married.  No history of smoking.  Occasional wine consumption.  She enjoys sewing.  She enjoys reading.   Family history: Father died in 2000/11/07 with coronary disease, CVA, MI diabetes.  Mother died age 42 in 48 with history of hypertension and lung cancer.  3 brothers and 1 sister in  good health.  No children.  Patient had a cousin who died of the sudden cardiac arrhythmia due to right ventricular dysplasia/cardiomyopathy.  Patient went to [facility] and was screened for that condition and was found not to have it.  She says several family members have tested positive for the gene.     Vaccine counseling: due for tetanus vaccine. UTD on shingles vaccine.  Past Medical History:  Diagnosis Date   Colon polyps    Complication of anesthesia 1980's   laparoscopic procedure invitro, [facility] heart [facility]   Diverticulitis    Diverticulosis    Fibrocystic breast disease    Hyperlipidemia    Hypertension    elevated bp in past, no current meds   Hypothyroidism    Osteopenia    Tubal ectopic pregnancy    x 3   Vaginal atrophy    Vitamin D deficiency      Family History  Problem Relation Age of Onset   Colon cancer Paternal Uncle    Cancer Paternal Uncle        Colon   Bladder Cancer Paternal Uncle    Cancer Paternal Uncle        Prostate/Bladder   Diabetes Father    Heart disease Father    Stroke Father    Hypertension Mother    Cancer Mother        Lung   Prostate cancer Paternal Uncle    Skin cancer Paternal Uncle    Diabetes Other  father's side juvenile and adult onset   Cancer Paternal Grandfather        Skin/Stomach     Social History   Social History Narrative   Not on file     ROS    Objective:   Vitals: There were no vitals taken for this visit.  Physical Exam   Most recent functional status assessment:    07/11/2023   10:00 PM  In your present state of health, do you have any difficulty performing the following activities:  Hearing? 0  Vision? 0  Difficulty concentrating or making decisions? 0  Walking or climbing stairs? 0  Dressing or bathing? 0  Doing errands, shopping? 0   Most recent fall risk assessment:    08/08/2023    2:52 PM  Fall Risk   Falls in the past year? 0  Number falls  in past yr: 0  Injury with Fall? 0  Risk for fall due to : No Fall Risks  Follow up Falls evaluation completed    Most recent depression screenings:    08/08/2023    2:52 PM 07/29/2023   11:02 AM  PHQ 2/9 Scores  PHQ - 2 Score 0 0   Most recent cognitive screening:    08/27/2022   10:52 AM  6CIT Screen  What Year? 0 points  What month? 0 points  What time? 0 points  Count back from 20 0 points  Months in reverse 0 points  Repeat phrase 0 points  Total Score 0 points     Results:   Studies obtained and personally reviewed by me:  Imaging, colonoscopy, mammogram, bone density scan, echocardiogram, heart cath, stress test, CT calcium score, etc. ***   Labs:       Component Value Date/Time   NA 138 07/21/2023 1233   K 5.0 07/21/2023 1233   CL 108 07/21/2023 1233   CO2 20 07/21/2023 1233   GLUCOSE 90 07/21/2023 1233   BUN 16 07/21/2023 1233   CREATININE 0.76 07/21/2023 1233   CALCIUM 9.2 07/21/2023 1233   PROT 6.4 07/21/2023 1233   ALBUMIN 2.9 (L) 07/16/2023 0600   AST 17 07/21/2023 1233   ALT 28 07/21/2023 1233   ALKPHOS 58 07/11/2023 1354   BILITOT 0.3 07/21/2023 1233   GFRNONAA >60 07/16/2023 0600   GFRNONAA 81 07/21/2020 1202   GFRAA 94 07/21/2020 1202     Lab Results  Component Value Date   WBC 7.3 07/21/2023   HGB 12.3 07/21/2023   HCT 37.0 07/21/2023   MCV 91.4 07/21/2023   PLT 445 (H) 07/21/2023    Lab Results  Component Value Date   CHOL 204 (H) 08/24/2022   HDL 68 08/24/2022   LDLCALC 108 (H) 08/24/2022   TRIG 168 (H) 08/24/2022   CHOLHDL 3.0 08/24/2022    Lab Results  Component Value Date   HGBA1C 5.8 (H) 07/13/2023     Lab Results  Component Value Date   TSH 0.290 (L) 07/11/2023     No results found for: "PSA1", "PSA" *** delete for female pts  ***  Assessment & Plan:   ***      Annual wellness visit done today including the all of the following: Reviewed patient's Family Medical History Reviewed and updated list of  patient's medical providers Assessment of cognitive impairment was done Assessed patient's functional ability Established a written schedule for health screening services Health Risk Assessent Completed and Reviewed  Discussed health benefits of physical activity, and encouraged her to  engage in regular exercise appropriate for her age and condition.        I,Alexander Ruley,acting as a Neurosurgeon for [name], MD.,have documented all relevant documentation on the behalf of [name], MD,as directed by  [name], MD while in the presence of [name], MD.   ***

## 2023-08-26 ENCOUNTER — Telehealth: Payer: Self-pay | Admitting: Internal Medicine

## 2023-08-26 NOTE — Telephone Encounter (Signed)
Called and let patient know I had cancelled her appointments and that DR Lenord Fellers said she should consider going to Urgent Care for Paxlovid, she verbalized understanding.

## 2023-08-26 NOTE — Telephone Encounter (Signed)
Pamela [name] just called to say she is out of town flying back in on Monday, however she has tested positive for COVID so I have canceled her appointments for labs on Monday, and [facility],physical on Tuesday. Added her name to wait list.

## 2023-08-29 ENCOUNTER — Ambulatory Visit (INDEPENDENT_AMBULATORY_CARE_PROVIDER_SITE_OTHER): Payer: Medicare Other

## 2023-08-29 ENCOUNTER — Ambulatory Visit (HOSPITAL_COMMUNITY)
Admission: RE | Admit: 2023-08-29 | Discharge: 2023-08-29 | Disposition: A | Discharge status: Home / Self Care | Payer: Medicare Other | Source: Ambulatory Visit | Attending: Family Medicine | Admitting: Family Medicine

## 2023-08-29 ENCOUNTER — Ambulatory Visit (HOSPITAL_COMMUNITY): Payer: Medicare Other

## 2023-08-29 ENCOUNTER — Other Ambulatory Visit: Payer: Medicare Other

## 2023-08-29 ENCOUNTER — Encounter (HOSPITAL_COMMUNITY): Payer: Self-pay

## 2023-08-29 VITALS — BP 152/90 | HR 67 | Temp 98.1°F | Resp 17

## 2023-08-29 DIAGNOSIS — S92344A Nondisplaced fracture of fourth metatarsal bone, right foot, initial encounter for closed fracture: Secondary | ICD-10-CM | POA: Diagnosis not present

## 2023-08-29 DIAGNOSIS — M79671 Pain in right foot: Secondary | ICD-10-CM | POA: Diagnosis not present

## 2023-08-29 DIAGNOSIS — M7731 Calcaneal spur, right foot: Secondary | ICD-10-CM | POA: Diagnosis not present

## 2023-08-29 MED ORDER — HYDROCODONE-ACETAMINOPHEN 5-325 MG PO TABS: 1.0000 | ORAL_TABLET | Freq: Four times a day (QID) | ORAL | 0 refills | Status: DC | PRN

## 2023-08-29 NOTE — Discharge Instructions (Addendum)
There is a fracture at the base of your fourth metatarsal.  The radiologist will also read your x-ray, and if their interpretation differs significantly from mine, we will call you.   Hydrocodone 5 mg--1 tablet every 6 hours as needed for pain.  This is best taken with food.  It can cause sleepiness or dizziness  Ice and elevation could still help for the next 48 hours or so.

## 2023-08-29 NOTE — ED Provider Notes (Signed)
[facility]    CSN: [id number] Arrival date & time: 08/29/23  1144      History   Chief Complaint Chief Complaint  Patient presents with   Foot Injury    COVID positive    HPI Pamela [name] is a 77 y.o. female.    Foot Injury Here for right mid distal foot pain.  On September 28 she was going down some steps in her cabin where she was staying out of town and missed a step and fell down.  She has pain and swelling in her midfoot and distal foot.  The ankle and heel do not hurt.  She did incidentally test positive for COVID on September 26, after she has started having some upper respiratory symptoms on September 25.  By September 28 she was symptom-free.  She did not end up taking any antivirals.    Past Medical History:  Diagnosis Date   Colon polyps    Complication of anesthesia 1980's   laparoscopic procedure invitro, [facility]   Diverticulitis    Diverticulosis    Fibrocystic breast disease    Hyperlipidemia    Hypertension    elevated bp in past, no current meds   Hypothyroidism    Osteopenia    Tubal ectopic pregnancy    x 3   Vaginal atrophy    Vitamin D deficiency     Patient Active Problem List   Diagnosis Date Noted   Cellulitis 07/11/2023   Hypokalemia 07/11/2023   Female cystocele 03/06/2023   Vaginal atrophy 03/06/2023   Incomplete emptying of bladder 03/06/2023   Family history of cardiac disorder 03/27/2014   Hypertension    Diverticulitis of sigmoid colon s/p robotic colectomy 09/04/2013 08/14/2013   Hypothyroidism 06/26/2011   Osteopenia 06/26/2011   Fibrocystic breast disease 06/26/2011   Hyperlipidemia 06/26/2011   Insomnia 06/26/2011    Past Surgical History:  Procedure Laterality Date   BREAST CYST ASPIRATION  2003   ECTOPIC PREGNANCY SURGERY  1980s   x3   FERTILITY SURGERY     x4 IVF   PARTIAL COLECTOMY     recurrent diverticulitis   tubal repair  1980's   WRIST FRACTURE SURGERY Left  1987    OB History     Gravida  3   Para      Term      Preterm      AB  3   Living         SAB      IAB      Ectopic  3   Multiple      Live Births               Home Medications    Prior to Admission medications   Medication Sig Start Date End Date Taking? Authorizing Provider  HYDROcodone-acetaminophen (NORCO/VICODIN) 5-325 MG tablet Take 1 tablet by mouth every 6 (six) hours as needed (pain). 08/29/23  Yes Zenia Resides, MD  cholecalciferol (VITAMIN D) 1000 UNITS tablet Take 1,000 Units by mouth daily.    [provider]  dorzolamide-timolol (COSOPT) 22.3-6.8 MG/ML ophthalmic solution Place 1 drop into both eyes 2 (two) times daily. 01/28/22   [provider]  levothyroxine (SYNTHROID) 88 MCG tablet TAKE 1 TABLET(88 MCG) BY MOUTH DAILY 07/21/23   [name], MD  simvastatin (ZOCOR) 20 MG tablet TAKE 1 TABLET(20 MG) BY MOUTH AT BEDTIME 08/25/22   [name], MD  Family History Family History  Problem Relation Age of Onset   Colon cancer Paternal Uncle    Cancer Paternal Uncle        Colon   Bladder Cancer Paternal Uncle    Cancer Paternal Uncle        Prostate/Bladder   Diabetes Father    Heart disease Father    Stroke Father    Hypertension Mother    Cancer Mother        Lung   Prostate cancer Paternal Uncle    Skin cancer Paternal Uncle    Diabetes Other        father's side juvenile and adult onset   Cancer Paternal Grandfather        Skin/Stomach    Social History Social History   Tobacco Use   Smoking status: Never   Smokeless tobacco: Never  Vaping Use   Vaping status: Never Used  Substance Use Topics   Alcohol use: Yes    Comment: Wine   Drug use: No     Allergies   Succinylcholine, Boniva [ibandronate sodium], Morphine and codeine, and Penicillins   Review of Systems Review of Systems   Physical Exam Triage Vital Signs ED Triage Vitals  Encounter Vitals Group     BP 08/29/23 1208 (!)  152/90     Systolic BP Percentile --      Diastolic BP Percentile --      Pulse Rate 08/29/23 1208 67     Resp 08/29/23 1208 17     Temp 08/29/23 1208 98.1 F (36.7 C)     Temp src --      SpO2 08/29/23 1208 97 %     Weight --      Height --      Head Circumference --      Peak Flow --      Pain Score 08/29/23 1205 7     Pain Loc --      Pain Education --      Exclude from Growth Chart --    No data found.  Updated Vital Signs BP (!) 152/90 (BP Location: Right Arm)   Pulse 67   Temp 98.1 F (36.7 C)   Resp 17   SpO2 97%   Visual Acuity Right Eye Distance:   Left Eye Distance:   Bilateral Distance:    Right Eye Near:   Left Eye Near:    Bilateral Near:     Physical Exam Vitals reviewed.  Constitutional:      General: She is not in acute distress.    Appearance: She is not ill-appearing, toxic-appearing or diaphoretic.  Musculoskeletal:     Comments: There is swelling and ecchymosis of the dorsum of the right foot.  There are some mild erythema.  Pulse is 2+, capillary refill is normal.  Skin:    Coloration: Skin is not pale.  Neurological:     Mental Status: She is alert and oriented to person, place, and time.  Psychiatric:        Behavior: Behavior normal.      UC Treatments / Results  Labs (all labs ordered are listed, but only abnormal results are displayed) Labs Reviewed - No data to display  EKG   Radiology No results found.  Procedures Procedures (including critical care time)  Medications Ordered in UC Medications - No data to display  Initial Impression / Assessment and Plan / UC Course  I have reviewed the triage vital signs and the  nursing notes.  Pertinent labs & imaging results that were available during my care of the patient were reviewed by me and considered in my medical decision making (see chart for details).       By my read there is at least a fracture at the base of the fourth metatarsal.  She is advised of radiology  over read.  Hydrocodone is sent in for pain relief.  Boot is applied and she is supplied with a walker.  She is given contact information for orthopedic follow-up. Since her symptoms are resolved, no further treatment or testing is indicated for the COVID infection she just recently had Final Clinical Impressions(s) / UC Diagnoses   Final diagnoses:  Closed nondisplaced fracture of fourth metatarsal bone of right foot, initial encounter     Discharge Instructions      There is a fracture at the base of your fourth metatarsal.  Hydrocodone 5 mg--1 tablet every 6 hours as needed for pain.  This is best taken with food.  It can cause sleepiness or dizziness  Ice and elevation could still help for the next 48 hours or so.       ED Prescriptions     Medication Sig Dispense Auth. Provider   HYDROcodone-acetaminophen (NORCO/VICODIN) 5-325 MG tablet Take 1 tablet by mouth every 6 (six) hours as needed (pain). 12 tablet Naquita Nappier, Janace Aris, MD      I have reviewed the PDMP during this encounter.   Zenia Resides, MD 08/29/23 [phone]

## 2023-08-29 NOTE — ED Triage Notes (Signed)
Pt is COVID positive.  She is here for right foot injury she fell down stairs on trip Saturday. Pt does have some swelling in right knee but does not have significant pain

## 2023-08-30 ENCOUNTER — Ambulatory Visit: Payer: Medicare Other | Admitting: Internal Medicine

## 2023-08-30 NOTE — Telephone Encounter (Signed)
When I called to check on patient about her COVID, I seen she was at the Urgent Care, she had fallen while on her trip and she has a Closed nondisplaced fracture of fourth metatarsal bone of right foot. She does have an appointment with Ortho after she gets over COVID. COVID is better, no symptoms now.

## 2023-08-30 NOTE — Telephone Encounter (Signed)
LVM for patient to CB, was checking on her and want to reschedule CPE

## 2023-09-06 ENCOUNTER — Ambulatory Visit: Payer: Medicare Other | Admitting: Family

## 2023-09-06 ENCOUNTER — Telehealth: Payer: Self-pay | Admitting: Family

## 2023-09-06 DIAGNOSIS — S92345A Nondisplaced fracture of fourth metatarsal bone, left foot, initial encounter for closed fracture: Secondary | ICD-10-CM | POA: Diagnosis not present

## 2023-09-06 NOTE — Telephone Encounter (Signed)
Patient called. Says Denny Peon was going to call in hydrocodone for her. She called the pharmacy and it isn't ready. [facility] on [address].

## 2023-09-07 ENCOUNTER — Encounter: Payer: Self-pay | Admitting: Family

## 2023-09-07 MED ORDER — HYDROCODONE-ACETAMINOPHEN 5-325 MG PO TABS: 1.0000 | ORAL_TABLET | Freq: Four times a day (QID) | ORAL | 0 refills | Status: DC | PRN

## 2023-09-07 NOTE — Progress Notes (Signed)
Office Visit Note   Patient: Pamela [name]           Date of Birth: [birth year]           MRN: [id number] Visit Date: 09/06/2023              Requested by: [name], MD [address] [place],  [place] [address] PCP: [name], MD  Chief Complaint  Patient presents with   Right Foot - Fracture    Closed nondisplaced fracture of 4th toe      HPI: The patient is a 77 year old woman who is seen as for initial evaluation of an injury to her right foot  The patient was out of state on September 28 when she had a fall going down some uneven stairs she missed a step and fell.  She had immediate onset of pain bruising and swelling.  Did have radiographs at urgent care on September 30 which were revealing for a fourth metatarsal base fracture.  She has been in a cam walker full weightbearing.  She reports the pain is tolerable she has been using Tylenol 500 mg during the day as well as some Norco at night to help with sleep her pain is much worse at night.  Assessment & Plan: Visit Diagnoses: No diagnosis found.  Plan: Discussed she may use her orthotic in her cam walker if she does have a high arch and has been having some pain due to the lack of support in the cam boot.  Have refilled her pain medication she will follow-up in 2 weeks with a radiograph of the right foot  Follow-Up Instructions: No follow-ups on file.   Ortho Exam  Patient is alert, oriented, no adenopathy, well-dressed, normal affect, normal respiratory effort. On examination right foot there is moderate edema with ecchymosis.  She has tenderness diffusely over the dorsum of her foot.  Toes are straight she does have a palpable dorsalis pedis pulse  Imaging: No results found. No images are attached to the encounter.  Labs: Lab Results  Component Value Date   HGBA1C 5.8 (H) 07/13/2023   REPTSTATUS 07/12/2023 FINAL 07/11/2023   CULT MULTIPLE SPECIES PRESENT, SUGGEST RECOLLECTION (A) 07/11/2023      Lab Results  Component Value Date   ALBUMIN 2.9 (L) 07/16/2023   ALBUMIN 2.4 (L) 07/14/2023   ALBUMIN 2.5 (L) 07/13/2023    Lab Results  Component Value Date   MG 2.1 07/16/2023   MG 2.1 07/14/2023   MG 1.7 07/13/2023   Lab Results  Component Value Date   VD25OH 30 07/03/2018   VD25OH 27 (L) 07/07/2017   VD25OH 28 (L) 06/29/2016    No results found for: "PREALBUMIN"    Latest Ref Rng & Units 07/21/2023   12:33 PM 07/16/2023    6:00 AM 07/14/2023    1:10 AM  CBC EXTENDED  WBC 3.8 - 10.8 Thousand/uL 7.3  10.2  7.8   RBC 3.80 - 5.10 Million/uL 4.05  4.06  3.51   Hemoglobin 11.7 - 15.5 g/dL 46.9  62.9  52.8   HCT 35.0 - 45.0 % 37.0  37.4  31.5   Platelets 140 - 400 Thousand/uL 445  332  193   NEUT# 1,500 - 7,800 cells/uL 4,891     Lymph# 850 - 3,900 cells/uL 1,832        There is no height or weight on file to calculate BMI.  Orders:  No orders of the defined types were placed  in this encounter.  No orders of the defined types were placed in this encounter.    Procedures: No procedures performed  Clinical Data: No additional findings.  ROS:  All other systems negative, except as noted in the HPI. Review of Systems  Objective: Vital Signs: There were no vitals taken for this visit.  Specialty Comments:  No specialty comments available.  PMFS History: Patient Active Problem List   Diagnosis Date Noted   Cellulitis 07/11/2023   Hypokalemia 07/11/2023   Female cystocele 03/06/2023   Vaginal atrophy 03/06/2023   Incomplete emptying of bladder 03/06/2023   Family history of cardiac disorder 03/27/2014   Hypertension    Diverticulitis of sigmoid colon s/p robotic colectomy 09/04/2013 08/14/2013   Hypothyroidism 06/26/2011   Osteopenia 06/26/2011   Fibrocystic breast disease 06/26/2011   Hyperlipidemia 06/26/2011   Insomnia 06/26/2011   Past Medical History:  Diagnosis Date   Colon polyps    Complication of anesthesia 1980's   laparoscopic  procedure invitro, [facility]   Diverticulitis    Diverticulosis    Fibrocystic breast disease    Hyperlipidemia    Hypertension    elevated bp in past, no current meds   Hypothyroidism    Osteopenia    Tubal ectopic pregnancy    x 3   Vaginal atrophy    Vitamin D deficiency     Family History  Problem Relation Age of Onset   Colon cancer Paternal Uncle    Cancer Paternal Uncle        Colon   Bladder Cancer Paternal Uncle    Cancer Paternal Uncle        Prostate/Bladder   Diabetes Father    Heart disease Father    Stroke Father    Hypertension Mother    Cancer Mother        Lung   Prostate cancer Paternal Uncle    Skin cancer Paternal Uncle    Diabetes Other        father's side juvenile and adult onset   Cancer Paternal Grandfather        Skin/Stomach    Past Surgical History:  Procedure Laterality Date   BREAST CYST ASPIRATION  2003   ECTOPIC PREGNANCY SURGERY  1980s   x3   FERTILITY SURGERY     x4 IVF   PARTIAL COLECTOMY     recurrent diverticulitis   tubal repair  1980's   WRIST FRACTURE SURGERY Left 1987   Social History   Occupational History   Occupation: Agricultural engineer: [employer]  Tobacco Use   Smoking status: Never   Smokeless tobacco: Never  Vaping Use   Vaping status: Never Used  Substance and Sexual Activity   Alcohol use: Yes    Comment: Wine   Drug use: No   Sexual activity: Not Currently

## 2023-09-07 NOTE — Telephone Encounter (Signed)
sent 

## 2023-09-20 ENCOUNTER — Other Ambulatory Visit (INDEPENDENT_AMBULATORY_CARE_PROVIDER_SITE_OTHER): Payer: Medicare Other

## 2023-09-20 ENCOUNTER — Ambulatory Visit: Payer: Medicare Other | Admitting: Family

## 2023-09-20 DIAGNOSIS — S92345A Nondisplaced fracture of fourth metatarsal bone, left foot, initial encounter for closed fracture: Secondary | ICD-10-CM

## 2023-09-21 ENCOUNTER — Encounter: Payer: Self-pay | Admitting: Family

## 2023-09-21 NOTE — Progress Notes (Signed)
Office Visit Note   Patient: Pamela [name]           Date of Birth: September 04, 1946           MRN: [id number] Visit Date: 09/20/2023              Requested by: [name], MD [address] [place],  [place] [address] PCP: [name], MD  Chief Complaint  Patient presents with   Right Foot - Follow-up    4th toe fracture      HPI: The patient is a 77 year old woman who presents a little over 3 weeks out from fracture of the base of her fourth metatarsal she has been in a cam walker.  She reports she has been able to bear weight in her cam walker with minimal pain and the most recent days.  She has had some improvement in her swelling as well she has been using Tylenol for pain  Is using her orthotic in her cam walker for arch support  Assessment & Plan: Visit Diagnoses:  1. Nondisplaced fracture of fourth metatarsal bone, left foot, initial encounter for closed fracture     Plan: Reassurance provided.  She will continue her cam boot for the next 2 weeks at that time she will advance to regular shoewear recommended a stiff soled walking shoe or hiking shoe  Follow-Up Instructions: Return in about 4 weeks (around 10/18/2023), or if symptoms worsen or fail to improve.   Ortho Exam  Patient is alert, oriented, no adenopathy, well-dressed, normal affect, normal respiratory effort. On examination right foot she has mild to moderate swelling with no ecchymosis no erythema no warmth does have a palpable dorsalis pedis pulse.  Remains tender over the dorsum  Imaging: XR Foot Complete Right  Result Date: 09/21/2023 Radiographs of the right foot show transverse fracture through the base of the fourth metatarsal on the right there is early callus formation.  No complicating feature.  No images are attached to the encounter.  Labs: Lab Results  Component Value Date   HGBA1C 5.8 (H) 07/13/2023   REPTSTATUS 07/12/2023 FINAL 07/11/2023   CULT MULTIPLE SPECIES PRESENT,  SUGGEST RECOLLECTION (A) 07/11/2023     Lab Results  Component Value Date   ALBUMIN 2.9 (L) 07/16/2023   ALBUMIN 2.4 (L) 07/14/2023   ALBUMIN 2.5 (L) 07/13/2023    Lab Results  Component Value Date   MG 2.1 07/16/2023   MG 2.1 07/14/2023   MG 1.7 07/13/2023   Lab Results  Component Value Date   VD25OH 30 07/03/2018   VD25OH 27 (L) 07/07/2017   VD25OH 28 (L) 06/29/2016    No results found for: "PREALBUMIN"    Latest Ref Rng & Units 07/21/2023   12:33 PM 07/16/2023    6:00 AM 07/14/2023    1:10 AM  CBC EXTENDED  WBC 3.8 - 10.8 Thousand/uL 7.3  10.2  7.8   RBC 3.80 - 5.10 Million/uL 4.05  4.06  3.51   Hemoglobin 11.7 - 15.5 g/dL 47.4  25.9  56.3   HCT 35.0 - 45.0 % 37.0  37.4  31.5   Platelets 140 - 400 Thousand/uL 445  332  193   NEUT# 1,500 - 7,800 cells/uL 4,891     Lymph# 850 - 3,900 cells/uL 1,832        There is no height or weight on file to calculate BMI.  Orders:  Orders Placed This Encounter  Procedures   XR Foot Complete Right  No orders of the defined types were placed in this encounter.    Procedures: No procedures performed  Clinical Data: No additional findings.  ROS:  All other systems negative, except as noted in the HPI. Review of Systems  Objective: Vital Signs: There were no vitals taken for this visit.  Specialty Comments:  No specialty comments available.  PMFS History: Patient Active Problem List   Diagnosis Date Noted   Cellulitis 07/11/2023   Hypokalemia 07/11/2023   Female cystocele 03/06/2023   Vaginal atrophy 03/06/2023   Incomplete emptying of bladder 03/06/2023   Family history of cardiac disorder 03/27/2014   Hypertension    Diverticulitis of sigmoid colon s/p robotic colectomy 09/04/2013 08/14/2013   Hypothyroidism 06/26/2011   Osteopenia 06/26/2011   Fibrocystic breast disease 06/26/2011   Hyperlipidemia 06/26/2011   Insomnia 06/26/2011   Past Medical History:  Diagnosis Date   Colon polyps     Complication of anesthesia 1980's   laparoscopic procedure invitro, [facility] heart [facility]   Diverticulitis    Diverticulosis    Fibrocystic breast disease    Hyperlipidemia    Hypertension    elevated bp in past, no current meds   Hypothyroidism    Osteopenia    Tubal ectopic pregnancy    x 3   Vaginal atrophy    Vitamin D deficiency     Family History  Problem Relation Age of Onset   Colon cancer Paternal Uncle    Cancer Paternal Uncle        Colon   Bladder Cancer Paternal Uncle    Cancer Paternal Uncle        Prostate/Bladder   Diabetes Father    Heart disease Father    Stroke Father    Hypertension Mother    Cancer Mother        Lung   Prostate cancer Paternal Uncle    Skin cancer Paternal Uncle    Diabetes Other        father's side juvenile and adult onset   Cancer Paternal Grandfather        Skin/Stomach    Past Surgical History:  Procedure Laterality Date   BREAST CYST ASPIRATION  2003   ECTOPIC PREGNANCY SURGERY  1980s   x3   FERTILITY SURGERY     x4 IVF   PARTIAL COLECTOMY     recurrent diverticulitis   tubal repair  1980's   WRIST FRACTURE SURGERY Left 1987   Social History   Occupational History   Occupation: Agricultural engineer: [employer]  Tobacco Use   Smoking status: Never   Smokeless tobacco: Never  Vaping Use   Vaping status: Never Used  Substance and Sexual Activity   Alcohol use: Yes    Comment: Wine   Drug use: No   Sexual activity: Not Currently

## 2023-10-19 ENCOUNTER — Other Ambulatory Visit: Payer: Self-pay | Admitting: Internal Medicine

## 2023-10-20 ENCOUNTER — Telehealth: Payer: Self-pay | Admitting: Internal Medicine

## 2023-10-20 NOTE — Telephone Encounter (Signed)
LVM for patient to CB so I can try to find a place to work her in before the end of the year.

## 2023-10-20 NOTE — Telephone Encounter (Signed)
Copied from CRM [id number]. Topic: General - Other >> Oct 20, 2023  4:21 PM Larwance Sachs wrote: Reason for CRM: Patient is trying to reschedule Lab work and [employer] Physical she missed 9/30. There earliest appointment I see open is for Dec 01 2023 and patient is wondering can she be scheduled before the end of 2024 since that visit is once annually and she technically has not has it for this year  Call back [phone]

## 2023-10-21 NOTE — Telephone Encounter (Signed)
scheduled

## 2023-10-24 NOTE — Progress Notes (Signed)
Annual Wellness Visit    Patient Care Team: Joan Herschberger, Luanna Cole, MD as PCP - General (Internal Medicine) [name], MD (Inactive) as Consulting Physician (Gastroenterology) Karie Soda, MD as Consulting Physician (General Surgery)  Visit Date: 10/31/23   Chief Complaint  Patient presents with   Medicare Wellness   Annual exam  Subjective:   Patient: Pamela [name], Female    DOB: [birth year], 77 y.o.   MRN: [id number]  Pamela [name] is a 77 y.o. Female who presents today for her Annual Wellness Visit and health maintenance exam. History of diverticulosis, diverticulitis, fibrocystic breast disease, hyperlipidemia, hypertension, hypothyroidism, osteopenia,remote hx of tubal ectopic pregnancy, Vitamin D deficiency.  She was seen in the ED on 07/09/23. Bedside ultrasound showed 2 x 2 cm fluid collection in the inferior portion of right labia majora for which she had bedside I&D with purulent drainage. She was discharged on p.o. doxycycline. She had progressive swelling, pain and diaphoresis overnight that prompted her to return to ED. Upon return to ED, WBC 10.5 with left shift. Lactic acid negative. K 3.2. 07/11/23 CT abdomen and pelvis showed extensive soft tissue swelling and infiltration in subcutaneous fat involving the right labia and extending into right perineal region consistent with cellulitis. Urinalysis with pyuria. Blood and urine cultures sent. Started on IV fluid and broad-spectrum antibiotics. Admitted on 07/11/23. Repeat CT pelvis on 07/15/23 with similar appearance of diffuse inflammatory change without abscess. History of sebaceous cyst on her left labia. Seeing gynecologist tomorrow for suspected abscess on labia.  Seen in ED on 08/29/23 for closed nondisplaced fracture at the base of the fourth metatarsal right foot. Injury occurred when she missed a step going down a set [place] cabin on 08[place]27[place]23. Discharged with hydrocodone 5 mg every six hours as  needed.  History of hypothyroidism treated with levothyroxine 88 mcg daily. TSH at 0.68.  History of hyperlipidemia treated with simvastatin 20 mg daily. Lipid panel normal.   History of left wrist fracture 1997 and had to be repaired surgically.   History of fibrocystic breast disease, diverticulosis of the colon status post laparoscopic colon resection.   Patient had tonsillectomy in the 1950s.  In 12/02/1993, she had a tubal repair laparotomy.  She had 3 ectopic pregnancies 1992, 1993 1995 age resulting in laparotomy.  10/25/23 labs reviewed today. Glucose elevated at 102. Kidney, liver functions normal. Electrolytes normal. Blood proteins normal. CBC normal. HGBA1c at 5.6% on 10/25/23.  11/11/22 mammogram showed possible asymmetry and possible calcifications right breast. 12/01/22 follow-up right mammogram showed no persistent asymmetry identified in the upper portion on additional spot compression views, no suspicious mass, distortion, or microcalcifications are identified to suggest presence of malignancy.  Had colonoscopy in Dec 02, 2012 and repeat study is due.   Had bone density study April 2024 with lowest score -2.1.  Has not wanted to be on bone sparing therapy.   Social history: She is retired.  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  She is married.  No history of smoking.  Occasional wine consumption.  She enjoys sewing.  She enjoys reading.   Family history: Father died in 2000-12-02 with coronary disease, CVA, MI diabetes.  Mother died age 7 in 67 with history of hypertension and lung cancer.  3 brothers and 1 sister in good health.  No children.  Patient had a cousin who died of the sudden cardiac arrhythmia due to right ventricular dysplasia/cardiomyopathy.  Patient went  to [facility] and was screened for that condition and was found not to have it.  She says several family members have tested positive for the gene.  Past Medical History:  Diagnosis Date    Colon polyps    Complication of anesthesia 1980's   laparoscopic procedure invitro, [facility] and heart [facility]   Diverticulitis    Diverticulosis    Fibrocystic breast disease    Hyperlipidemia    Hypertension    elevated bp in past, no current meds   Hypothyroidism    Osteopenia    Tubal ectopic pregnancy    x 3   Vaginal atrophy    Vitamin D deficiency      Family History  Problem Relation Age of Onset   Colon cancer Paternal Uncle    Cancer Paternal Uncle        Colon   Bladder Cancer Paternal Uncle    Cancer Paternal Uncle        Prostate/Bladder   Diabetes Father    Heart disease Father    Stroke Father    Hypertension Mother    Cancer Mother        Lung   Prostate cancer Paternal Uncle    Skin cancer Paternal Uncle    Diabetes Other        father's side juvenile and adult onset   Cancer Paternal Grandfather        Skin/Stomach     Social Hx: She is married. She is retired. 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 No hx of smoking. Occasional wine consumption.    Review of Systems  Constitutional:  Negative for chills, fever, malaise/fatigue and weight loss.  HENT:  Negative for hearing loss, sinus pain and sore throat.   Respiratory:  Negative for cough, hemoptysis and shortness of breath.   Cardiovascular:  Negative for chest pain, palpitations, leg swelling and PND.  Gastrointestinal:  Negative for abdominal pain, constipation, diarrhea, heartburn, nausea and vomiting.  Genitourinary:  Negative for dysuria, frequency and urgency.  Musculoskeletal:  Negative for back pain, myalgias and neck pain.  Skin:  Negative for itching and rash.  Neurological:  Negative for dizziness, tingling, seizures and headaches.  Endo/Heme/Allergies:  Negative for polydipsia.  Psychiatric/Behavioral:  Negative for depression. The patient is not nervous/anxious.       Objective:   Vitals: BP 130/82   Pulse (!) 50   Ht 5\' 1"  (1.549 m)    Wt 138 lb (62.6 kg)   SpO2 98%   BMI 26.07 kg/m   Physical Exam Vitals and nursing note reviewed.  Constitutional:      General: She is not in acute distress.    Appearance: Normal appearance. She is not ill-appearing or toxic-appearing.  HENT:     Head: Normocephalic and atraumatic.     Right Ear: Hearing, tympanic membrane, ear canal and external ear normal.     Left Ear: Hearing, tympanic membrane, ear canal and external ear normal.     Mouth/Throat:     Pharynx: Oropharynx is clear.  Eyes:     Extraocular Movements: Extraocular movements intact.     Pupils: Pupils are equal, round, and reactive to light.  Neck:     Thyroid: No thyroid mass, thyromegaly or thyroid tenderness.     Vascular: No carotid bruit.  Cardiovascular:     Rate and Rhythm: Normal rate and regular rhythm. No extrasystoles are present.    Pulses:  Dorsalis pedis pulses are 1+ on the right side and 1+ on the left side.     Heart sounds: Normal heart sounds. No murmur heard.    No friction rub. No gallop.  Pulmonary:     Effort: Pulmonary effort is normal.     Breath sounds: Normal breath sounds. No decreased breath sounds, wheezing, rhonchi or rales.  Chest:     Chest wall: No mass.  Breasts:    Right: No mass.     Left: No mass.  Abdominal:     Palpations: Abdomen is soft. There is no hepatomegaly, splenomegaly or mass.     Tenderness: There is no abdominal tenderness.     Hernia: No hernia is present.  Musculoskeletal:     Cervical back: Normal range of motion.     Right lower leg: No edema.     Left lower leg: No edema.  Lymphadenopathy:     Cervical: No cervical adenopathy.     Upper Body:     Right upper body: No supraclavicular adenopathy.     Left upper body: No supraclavicular adenopathy.  Skin:    General: Skin is warm and dry.  Neurological:     General: No focal deficit present.     Mental Status: She is alert and oriented to person, place, and time. Mental status is at  baseline.     Sensory: Sensation is intact.     Motor: Motor function is intact. No weakness.     Deep Tendon Reflexes: Reflexes are normal and symmetric.  Psychiatric:        Attention and Perception: Attention normal.        Mood and Affect: Mood normal.        Speech: Speech normal.        Behavior: Behavior normal.        Thought Content: Thought content normal.        Cognition and Memory: Cognition normal.        Judgment: Judgment normal.     Most recent functional status assessment:    10/31/2023    2:35 PM  In your present state of health, do you have any difficulty performing the following activities:  Hearing? 0  Vision? 0  Difficulty concentrating or making decisions? 0  Walking or climbing stairs? 0  Dressing or bathing? 0  Doing errands, shopping? 0  Preparing Food and eating ? N  Using the Toilet? N  In the past six months, have you accidently leaked urine? N  Do you have problems with loss of bowel control? N  Managing your Medications? N  Managing your Finances? N  Housekeeping or managing your Housekeeping? N   Most recent fall risk assessment:    10/31/2023    2:36 PM  Fall Risk   Falls in the past year? 1  Number falls in past yr: 0  Injury with Fall? 1  Risk for fall due to : Other (Comment)  Follow up Falls evaluation completed;Falls prevention discussed;Education provided    Most recent depression screenings:    08/08/2023    2:52 PM 07/29/2023   11:02 AM  PHQ 2/9 Scores  PHQ - 2 Score 0 0   Most recent cognitive screening:    10/31/2023    2:37 PM  6CIT Screen  What Year? 0 points  What month? 0 points  What time? 0 points  Count back from 20 0 points  Months in reverse 0 points  Repeat phrase 0 points  Total Score 0 points     Results:   Studies obtained and personally reviewed by me:  11/11/22 mammogram showed possible asymmetry and possible calcifications right breast. 12/01/22 follow-up right mammogram showed no persistent  asymmetry identified in the upper portion on additional spot compression views, no suspicious mass, distortion, or microcalcifications are identified to suggest presence of malignancy.  Had colonoscopy in 2013 and repeat study is due.   Had bone density study April 2024 with lowest score -2.1.  Has not wanted to be on bone sparing therapy.   Labs:       Component Value Date/Time   NA 142 10/25/2023 0923   K 4.9 10/25/2023 0923   CL 106 10/25/2023 0923   CO2 27 10/25/2023 0923   GLUCOSE 102 (H) 10/25/2023 0923   BUN 13 10/25/2023 0923   CREATININE 0.71 10/25/2023 0923   CALCIUM 9.4 10/25/2023 0923   PROT 6.7 10/25/2023 0923   ALBUMIN 2.9 (L) 07/16/2023 0600   AST 16 10/25/2023 0923   ALT 13 10/25/2023 0923   ALKPHOS 58 07/11/2023 1354   BILITOT 0.4 10/25/2023 0923   GFRNONAA >60 07/16/2023 0600   GFRNONAA 81 07/21/2020 1202   GFRAA 94 07/21/2020 1202     Lab Results  Component Value Date   WBC 4.7 10/25/2023   HGB 13.6 10/25/2023   HCT 41.5 10/25/2023   MCV 91.6 10/25/2023   PLT 255 10/25/2023    Lab Results  Component Value Date   CHOL 192 10/25/2023   HDL 75 10/25/2023   LDLCALC 95 10/25/2023   TRIG 119 10/25/2023   CHOLHDL 2.6 10/25/2023    Lab Results  Component Value Date   HGBA1C 5.6 10/25/2023     Lab Results  Component Value Date   TSH 0.68 10/25/2023    Assessment & Plan:   Hypothyroidism: treated with levothyroxine 88 mcg daily. TSH at 0.68.  Mixed hyperlipidemia: treated with simvastatin 20 mg daily. Lipid panel normal.    Insomnia: refilled Ambien. Has taken this for years without side effects.  Prescribed Zofran, levofloxacin for upcoming travel to [place]. For malaria prophylaxis check with [facility]  History of glaucoma treated with Cosopt.  Urinalysis today showed trace blood.  Osteopenia  Hx of vitamin D deficiency  Pelvic exam deferred to Dr. Hyacinth Meeker, GYN  Hx of cystocele seen  by GYN. Has been fitted with pessary  Hx abscess right labia with right perineal cellulitis requiring hospitalization in August 2024  Left fourth metatarsal fracture.  Osteopenia- bone density T score -2.1. Hx mild Vitamin D deficiency in the remote past. Encourage taking daily Vitamin  D supplement.  11/11/22 mammogram showed possible asymmetry and possible calcifications right breast. 12/01/22 follow-up right mammogram showed no persistent asymmetry identified in the upper portion on additional spot compression views, no suspicious mass, distortion, or microcalcifications are identified to suggest presence of malignancy.  Had colonoscopy in 2013 and repeat study is due.    Had bone density study April 2024 with lowest score -2.1.  Has not wanted to be on bone sparing therapy.   Hx mixed hyperlipidemia. Recent lipid panel is normal. Pt takes simvastatin  Vaccine counseling: UTD on tetanus, shingles vaccine.   Return in 1 year or as needed.     Annual wellness visit done today including the all of the following: Reviewed patient's Family Medical History Reviewed and updated list of patient's medical providers Assessment of cognitive impairment was done Assessed patient's functional ability  Established a written schedule for health screening services Health Risk Assessent Completed and Reviewed  Discussed health benefits of physical activity, and encouraged her to engage in regular exercise appropriate for her age and condition.        I,Alexander Ruley,acting as a Neurosurgeon for [name], MD.,have documented all relevant documentation on the behalf of [name], MD,as directed by  [name], MD while in the presence of [name], MD.   I, [name], MD, have reviewed all documentation for this visit. The documentation on 11/14/23 for the exam, diagnosis, procedures, and orders are all accurate and complete.

## 2023-10-25 ENCOUNTER — Other Ambulatory Visit: Payer: Medicare Other

## 2023-10-25 DIAGNOSIS — M8580 Other specified disorders of bone density and structure, unspecified site: Secondary | ICD-10-CM

## 2023-10-25 DIAGNOSIS — E039 Hypothyroidism, unspecified: Secondary | ICD-10-CM | POA: Diagnosis not present

## 2023-10-25 DIAGNOSIS — R7302 Impaired glucose tolerance (oral): Secondary | ICD-10-CM | POA: Diagnosis not present

## 2023-10-25 DIAGNOSIS — E7800 Pure hypercholesterolemia, unspecified: Secondary | ICD-10-CM | POA: Diagnosis not present

## 2023-10-25 DIAGNOSIS — Z0000 Encounter for general adult medical examination without abnormal findings: Secondary | ICD-10-CM | POA: Diagnosis not present

## 2023-10-26 LAB — COMPLETE METABOLIC PANEL WITH GFR
AG Ratio: 1.9 (calc) (ref 1.0–2.5)
ALT: 13 U/L (ref 6–29)
AST: 16 U/L (ref 10–35)
Albumin: 4.4 g/dL (ref 3.6–5.1)
Alkaline phosphatase (APISO): 54 U/L (ref 37–153)
BUN: 13 mg/dL (ref 7–25)
CO2: 27 mmol/L (ref 20–32)
Calcium: 9.4 mg/dL (ref 8.6–10.4)
Chloride: 106 mmol/L (ref 98–110)
Creat: 0.71 mg/dL (ref 0.60–1.00)
Globulin: 2.3 g/dL (ref 1.9–3.7)
Glucose, Bld: 102 mg/dL — ABNORMAL HIGH (ref 65–99)
Potassium: 4.9 mmol/L (ref 3.5–5.3)
Sodium: 142 mmol/L (ref 135–146)
Total Bilirubin: 0.4 mg/dL (ref 0.2–1.2)
Total Protein: 6.7 g/dL (ref 6.1–8.1)
eGFR: 89 mL/min/{1.73_m2} (ref >=60)

## 2023-10-26 LAB — LIPID PANEL
Cholesterol: 192 mg/dL (ref <200)
HDL: 75 mg/dL (ref >=50)
LDL Cholesterol (Calc): 95 mg/dL
Non-HDL Cholesterol (Calc): 117 mg/dL (ref <130)
Total CHOL/HDL Ratio: 2.6 (calc) (ref <5.0)
Triglycerides: 119 mg/dL (ref <150)

## 2023-10-26 LAB — HEMOGLOBIN A1C
Hgb A1c MFr Bld: 5.6 %{Hb} (ref <5.7)
Mean Plasma Glucose: 114 mg/dL
eAG (mmol/L): 6.3 mmol/L

## 2023-10-26 LAB — CBC WITH DIFFERENTIAL/PLATELET
Absolute Lymphocytes: 1842 {cells}/uL (ref 850–3900)
Absolute Monocytes: 306 {cells}/uL (ref 200–950)
Basophils Absolute: 28 {cells}/uL (ref 0–200)
Basophils Relative: 0.6 %
Eosinophils Absolute: 52 {cells}/uL (ref 15–500)
Eosinophils Relative: 1.1 %
HCT: 41.5 % (ref 35.0–45.0)
Hemoglobin: 13.6 g/dL (ref 11.7–15.5)
MCH: 30 pg (ref 27.0–33.0)
MCHC: 32.8 g/dL (ref 32.0–36.0)
MCV: 91.6 fL (ref 80.0–100.0)
MPV: 11.2 fL (ref 7.5–12.5)
Monocytes Relative: 6.5 %
Neutro Abs: 2472 {cells}/uL (ref 1500–7800)
Neutrophils Relative %: 52.6 %
Platelets: 255 10*3/uL (ref 140–400)
RBC: 4.53 10*6/uL (ref 3.80–5.10)
RDW: 13.1 % (ref 11.0–15.0)
Total Lymphocyte: 39.2 %
WBC: 4.7 10*3/uL (ref 3.8–10.8)

## 2023-10-26 LAB — TSH: TSH: 0.68 m[IU]/L (ref 0.40–4.50)

## 2023-10-31 ENCOUNTER — Encounter: Payer: Self-pay | Admitting: Internal Medicine

## 2023-10-31 ENCOUNTER — Ambulatory Visit: Payer: Medicare Other | Admitting: Internal Medicine

## 2023-10-31 VITALS — BP 130/82 | HR 50 | Ht 61.0 in | Wt 138.0 lb

## 2023-10-31 DIAGNOSIS — Z7184 Encounter for health counseling related to travel: Secondary | ICD-10-CM | POA: Diagnosis not present

## 2023-10-31 DIAGNOSIS — E782 Mixed hyperlipidemia: Secondary | ICD-10-CM

## 2023-10-31 DIAGNOSIS — S92342D Displaced fracture of fourth metatarsal bone, left foot, subsequent encounter for fracture with routine healing: Secondary | ICD-10-CM

## 2023-10-31 DIAGNOSIS — E559 Vitamin D deficiency, unspecified: Secondary | ICD-10-CM

## 2023-10-31 DIAGNOSIS — Z8669 Personal history of other diseases of the nervous system and sense organs: Secondary | ICD-10-CM

## 2023-10-31 DIAGNOSIS — Z0000 Encounter for general adult medical examination without abnormal findings: Secondary | ICD-10-CM | POA: Diagnosis not present

## 2023-10-31 DIAGNOSIS — Z9049 Acquired absence of other specified parts of digestive tract: Secondary | ICD-10-CM | POA: Diagnosis not present

## 2023-10-31 DIAGNOSIS — M8580 Other specified disorders of bone density and structure, unspecified site: Secondary | ICD-10-CM

## 2023-10-31 DIAGNOSIS — E039 Hypothyroidism, unspecified: Secondary | ICD-10-CM | POA: Diagnosis not present

## 2023-10-31 DIAGNOSIS — Z87898 Personal history of other specified conditions: Secondary | ICD-10-CM

## 2023-10-31 LAB — POCT URINALYSIS DIP (CLINITEK)
Bilirubin, UA: NEGATIVE
Glucose, UA: NEGATIVE mg/dL
Ketones, POC UA: NEGATIVE mg/dL
Leukocytes, UA: NEGATIVE
Nitrite, UA: NEGATIVE
POC PROTEIN,UA: NEGATIVE
Spec Grav, UA: 1.01 (ref 1.010–1.025)
Urobilinogen, UA: 0.2 U/dL
pH, UA: 7 (ref 5.0–8.0)

## 2023-10-31 MED ORDER — ONDANSETRON HCL 4 MG PO TABS: 4.0000 mg | ORAL_TABLET | Freq: Three times a day (TID) | ORAL | 0 refills | Status: DC | PRN

## 2023-10-31 MED ORDER — LEVOFLOXACIN 500 MG PO TABS: 500.0000 mg | ORAL_TABLET | Freq: Every day | ORAL | 0 refills | Status: AC

## 2023-11-01 ENCOUNTER — Ambulatory Visit (HOSPITAL_BASED_OUTPATIENT_CLINIC_OR_DEPARTMENT_OTHER): Payer: Medicare Other | Admitting: Obstetrics & Gynecology

## 2023-11-01 ENCOUNTER — Encounter (HOSPITAL_BASED_OUTPATIENT_CLINIC_OR_DEPARTMENT_OTHER): Payer: Self-pay | Admitting: Obstetrics & Gynecology

## 2023-11-01 VITALS — BP 197/94 | HR 61 | Wt 139.6 lb

## 2023-11-01 DIAGNOSIS — N764 Abscess of vulva: Secondary | ICD-10-CM | POA: Diagnosis not present

## 2023-11-01 MED ORDER — SULFAMETHOXAZOLE-TRIMETHOPRIM 800-160 MG PO TABS: 1.0000 | ORAL_TABLET | Freq: Two times a day (BID) | ORAL | 0 refills | Status: DC

## 2023-11-01 NOTE — Progress Notes (Unsigned)
GYNECOLOGY  VISIT  CC:   labial cyst  HPI: 77 y.o. G100P0030 Married White or Caucasian female here for concerns about labial cyst, tenderness that started over the thanksgiving weekend.  She is not having any fevers and feels the area is coming to a head.  She had a very significant abscess on the opposite side and is not having issues on the right.  She is very anxious considering how severe the prior abscess was.  No bleeding or drainage at this point.   Past Medical History:  Diagnosis Date   Colon polyps    Complication of anesthesia 1980's   laparoscopic procedure invitro, [facility] heart [facility]   Diverticulitis    Diverticulosis    Fibrocystic breast disease    Hyperlipidemia    Hypertension    elevated bp in past, no current meds   Hypothyroidism    Osteopenia    Tubal ectopic pregnancy    x 3   Vaginal atrophy    Vitamin D deficiency     MEDS:   Current Outpatient Medications on File Prior to Visit  Medication Sig Dispense Refill   cholecalciferol (VITAMIN D) 1000 UNITS tablet Take 1,000 Units by mouth daily.     dorzolamide-timolol (COSOPT) 22.3-6.8 MG/ML ophthalmic solution Place 1 drop into both eyes 2 (two) times daily.     HYDROcodone-acetaminophen (NORCO/VICODIN) 5-325 MG tablet Take 1 tablet by mouth every 6 (six) hours as needed (pain). 30 tablet 0   levofloxacin (LEVAQUIN) 500 MG tablet Take 1 tablet (500 mg total) by mouth daily for 7 days. 10 tablet 0   levothyroxine (SYNTHROID) 88 MCG tablet TAKE 1 TABLET(88 MCG) BY MOUTH DAILY 90 tablet 0   ondansetron (ZOFRAN) 4 MG tablet Take 1 tablet (4 mg total) by mouth every 8 (eight) hours as needed for nausea or vomiting. 20 tablet 0   simvastatin (ZOCOR) 20 MG tablet TAKE 1 TABLET(20 MG) BY MOUTH AT BEDTIME 90 tablet 3   No current facility-administered medications on file prior to visit.    ALLERGIES: Succinylcholine, Boniva [ibandronate sodium], Morphine and codeine, and Penicillins  SH:   married, non smoker  Review of Systems  Constitutional: Negative.   Genitourinary:        Tender lesion    PHYSICAL EXAMINATION:    BP (!) 194/94 (BP Location: Left Arm, Patient Position: Sitting, Cuff Size: Normal)   Pulse (!) 58   Wt 139 lb 9.6 oz (63.3 kg)   BMI 26.38 kg/m     General appearance: alert, cooperative and appears stated age Abdomen: soft, non-tender; bowel sounds normal; no masses,  no organomegaly Lymph:  no inguinal LAD noted  Pelvic: External genitalia:  left sided erythematous 2.5cm furuncle with some fluctuance in the middle              Urethra:  normal appearing urethra with no masses, tenderness or lesions              Bartholins and Skenes: normal                  I&D recommended.  Consent obtained.  Area cleansed with betadine x 3.  1% lidocaine instilled, 1cc instilled.  Using #11 blade, lesion opened.  Purulent drainage noted.  Lesion milked and probed to ensure no loculations.  Packing not requires as not deep.  No dressing applied.    Chaperone, Verlin Fester, RN, was present for exam.  Assessment/Plan: 1. Labial abscess - will start antibiotics  and follow up in 48 hours with pt - sulfamethoxazole-trimethoprim (BACTRIM DS) 800-160 MG tablet; Take 1 tablet by mouth 2 (two) times daily.  Dispense: 20 tablet; Refill: 0 - WOUND CULTURE obtained today

## 2023-11-02 ENCOUNTER — Other Ambulatory Visit: Payer: Self-pay | Admitting: Internal Medicine

## 2023-11-02 DIAGNOSIS — N764 Abscess of vulva: Secondary | ICD-10-CM | POA: Diagnosis not present

## 2023-11-03 ENCOUNTER — Other Ambulatory Visit: Payer: Self-pay

## 2023-11-03 ENCOUNTER — Encounter (HOSPITAL_BASED_OUTPATIENT_CLINIC_OR_DEPARTMENT_OTHER): Payer: Self-pay | Admitting: Obstetrics & Gynecology

## 2023-11-03 ENCOUNTER — Encounter: Payer: Self-pay | Admitting: Pharmacist

## 2023-11-03 ENCOUNTER — Ambulatory Visit (HOSPITAL_BASED_OUTPATIENT_CLINIC_OR_DEPARTMENT_OTHER): Payer: Medicare Other | Admitting: Obstetrics & Gynecology

## 2023-11-03 DIAGNOSIS — N764 Abscess of vulva: Secondary | ICD-10-CM

## 2023-11-03 MED ORDER — SULFAMETHOXAZOLE-TRIMETHOPRIM 800-160 MG PO TABS: 1.0000 | ORAL_TABLET | Freq: Two times a day (BID) | ORAL | 0 refills | Status: DC

## 2023-11-03 MED ORDER — SIMVASTATIN 20 MG PO TABS: 20.0000 mg | ORAL_TABLET | Freq: Every day | ORAL | 3 refills | Status: DC

## 2023-11-03 NOTE — Progress Notes (Signed)
GYNECOLOGY  VISIT  CC:   vulvar recheck  HPI: 77 y.o. G11P0030 Married White or Caucasian female here for recheck after having I&D of left labial abscess.  Pt reports pain is better.  Drainage has stopped.  No fever.  No bleeding.  Has take 4 doses so not quite 48 hours.  Not having any issues with antibiotic.   Past Medical History:  Diagnosis Date   Colon polyps    Complication of anesthesia 1980's   laparoscopic procedure invitro, [facility] heart [facility]   Diverticulitis    Diverticulosis    Fibrocystic breast disease    Hyperlipidemia    Hypertension    elevated bp in past, no current meds   Hypothyroidism    Osteopenia    Tubal ectopic pregnancy    x 3   Vaginal atrophy    Vitamin D deficiency     MEDS:   Current Outpatient Medications on File Prior to Visit  Medication Sig Dispense Refill   cholecalciferol (VITAMIN D) 1000 UNITS tablet Take 1,000 Units by mouth daily.     dorzolamide-timolol (COSOPT) 22.3-6.8 MG/ML ophthalmic solution Place 1 drop into both eyes 2 (two) times daily.     HYDROcodone-acetaminophen (NORCO/VICODIN) 5-325 MG tablet Take 1 tablet by mouth every 6 (six) hours as needed (pain). 30 tablet 0   levofloxacin (LEVAQUIN) 500 MG tablet Take 1 tablet (500 mg total) by mouth daily for 7 days. 10 tablet 0   levothyroxine (SYNTHROID) 88 MCG tablet TAKE 1 TABLET(88 MCG) BY MOUTH DAILY 90 tablet 0   ondansetron (ZOFRAN) 4 MG tablet Take 1 tablet (4 mg total) by mouth every 8 (eight) hours as needed for nausea or vomiting. 20 tablet 0   simvastatin (ZOCOR) 20 MG tablet TAKE 1 TABLET(20 MG) BY MOUTH AT BEDTIME 90 tablet 3   No current facility-administered medications on file prior to visit.    ALLERGIES: Succinylcholine, Boniva [ibandronate sodium], Morphine and codeine, and Penicillins  SH:  married, non smoker  Review of Systems  Constitutional: Negative.   Genitourinary: Negative.     PHYSICAL EXAMINATION:    BP (!) 146/85 (BP  Location: Left Arm, Patient Position: Sitting, Cuff Size: Normal)   Pulse (!) 53   Ht 5\' 2"  (1.575 m) Comment: Reported  Wt 138 lb (62.6 kg)   BMI 25.24 kg/m     General appearance: alert, cooperative and appears stated age Lymph:  no inguinal LAD noted  Pelvic: External genitalia:  significantly decreased erythema, decreased tenderness, induration still present but no fluctuance              Urethra:  normal appearing urethra with no masses, tenderness or lesions              Assessment/Plan: 1. Labial abscess - Doing well.  Pt will complete antibiotics.  Culture pending. - due to upcoming travel, RF for bactrim to pharmacy.  (sulfamethoxazole-trimethoprim (BACTRIM DS) 800-160 MG tablet; Take 1 tablet by mouth 2 (two) times daily.  Dispense: 20 tablet; Refill: 0)

## 2023-11-03 NOTE — Progress Notes (Unsigned)
Pharmacy Quality Measure Review  This patient is appearing on a report for being at risk of failing the adherence measure for cholesterol (statin) medications this calendar year.   Medication: simvastatin 20 mg daily Last fill date: 08/08/23 for 90 day supply   Prescription is expired. Will collaborate with PCP to refill.   Catie Eppie Gibson, PharmD, BCACP, CPP Clinical Pharmacist [facility] [phone]

## 2023-11-05 LAB — WOUND CULTURE

## 2023-11-11 ENCOUNTER — Encounter (HOSPITAL_BASED_OUTPATIENT_CLINIC_OR_DEPARTMENT_OTHER): Payer: Self-pay | Admitting: *Deleted

## 2023-11-11 ENCOUNTER — Other Ambulatory Visit (HOSPITAL_BASED_OUTPATIENT_CLINIC_OR_DEPARTMENT_OTHER): Payer: Self-pay | Admitting: *Deleted

## 2023-11-11 MED ORDER — CHLORHEXIDINE GLUCONATE 0.12 % MT SOLN: OROMUCOSAL | 5 refills | Status: DC

## 2023-11-11 MED ORDER — CHLORHEXIDINE GLUCONATE 4 % EX SOLN: CUTANEOUS | 6 refills | Status: DC

## 2023-11-11 MED ORDER — MUPIROCIN CALCIUM 2 % EX CREA: TOPICAL_CREAM | CUTANEOUS | 5 refills | Status: DC

## 2023-11-11 NOTE — Progress Notes (Signed)
Prescriptions sent for MRSA decolonization regimen

## 2023-11-14 MED ORDER — ZOLPIDEM TARTRATE 5 MG PO TABS: 5.0000 mg | ORAL_TABLET | Freq: Every evening | ORAL | 0 refills | Status: AC | PRN

## 2023-11-14 NOTE — Patient Instructions (Addendum)
Consider treatment for osteopenia. Continue Vitamin D supplement. Continue simvastatin for hyperlipidemia. Continue Ambien for longstanding insomnia. Travel advice given. Continue eye drops for hx of glaucoma. Return in one year or as needed. Levaquin and Zofran prescribed for travel to [place].

## 2024-01-17 ENCOUNTER — Other Ambulatory Visit: Payer: Self-pay | Admitting: Internal Medicine

## 2024-02-20 DIAGNOSIS — H52223 Regular astigmatism, bilateral: Secondary | ICD-10-CM | POA: Diagnosis not present

## 2024-03-21 DIAGNOSIS — H401132 Primary open-angle glaucoma, bilateral, moderate stage: Secondary | ICD-10-CM | POA: Diagnosis not present

## 2024-04-29 ENCOUNTER — Other Ambulatory Visit: Payer: Self-pay | Admitting: Internal Medicine

## 2024-07-26 ENCOUNTER — Other Ambulatory Visit: Payer: Self-pay | Admitting: Internal Medicine

## 2024-10-08 ENCOUNTER — Telehealth: Payer: Self-pay | Admitting: Gastroenterology

## 2024-10-08 ENCOUNTER — Other Ambulatory Visit: Payer: Self-pay | Admitting: Internal Medicine

## 2024-10-08 DIAGNOSIS — Z1231 Encounter for screening mammogram for malignant neoplasm of breast: Secondary | ICD-10-CM

## 2024-10-08 NOTE — Telephone Encounter (Signed)
 Ok to schedule direct colonoscopy

## 2024-10-08 NOTE — Telephone Encounter (Signed)
 Good afternoon Dr. Shila,  DOD PM 11/10  Patient called wishing to schedule a colonoscopy. Patient has recall for 2023. Patient is now the age of 43. Would it be best to schedule patient directly for colonoscopy? Or is a office visit prior more appropriate. Please advise further on scheduling.   Thank you

## 2024-10-18 ENCOUNTER — Other Ambulatory Visit: Payer: Self-pay | Admitting: Internal Medicine

## 2024-10-19 DIAGNOSIS — H401132 Primary open-angle glaucoma, bilateral, moderate stage: Secondary | ICD-10-CM | POA: Diagnosis not present

## 2024-10-22 NOTE — Progress Notes (Shared)
 Annual Wellness Visit   Patient Care Team: Baxley, Ronal PARAS, MD as PCP - General (Internal Medicine) Obie Princella HERO, MD (Inactive) as Consulting Physician (Gastroenterology) Sheldon Standing, MD as Consulting Physician (General Surgery)  Visit Date: 11/01/24   Chief Complaint  Patient presents with   Annual Exam   Subjective:  Patient: Pamela [name], Female DOB: 06/08/1946, 78 y.o. MRN: [id number] Vitals:   11/01/24 1503  BP: 134/72   Pamela [name] is a 78 y.o. Female who presents today for her Annual Wellness Visit. 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   She is having cataract surgery in January.    History of hypothyroidism treated with levothyroxine  88 mcg daily.  10/29/2024 TSH 0.32.    History of hyperlipidemia treated with simvastatin  20 mg daily. 10/29/2024 Lipid panel Triglycerides 171, LDL 102.    History of left wrist fracture 1997 and had to be repaired surgically.   She was seen in the ED on 07/09/23. Bedside ultrasound showed 2 x 2 cm fluid collection in the inferior portion of right labia majora for which she had bedside I&D with purulent drainage. She was discharged on p.o. doxycycline . She had progressive swelling, pain and diaphoresis overnight that prompted her to return to ED. Upon return to ED, WBC 10.5 with left shift. Lactic acid negative. K 3.2. 07/11/23 CT abdomen and pelvis showed extensive soft tissue swelling and infiltration in subcutaneous fat involving the right labia and extending into right perineal region consistent with cellulitis. Urinalysis with pyuria. Blood and urine cultures sent. Started on IV fluid and broad-spectrum antibiotics. Admitted on 07/11/23. Repeat CT  pelvis on 07/15/23 with similar appearance of diffuse inflammatory change without abscess. History of sebaceous cyst on her left labia.     Seen in ED on 08/29/23 for closed nondisplaced fracture at the base of the fourth metatarsal right foot. Injury occurred when she missed a step going down a set of stairs [place] cabin on 08[place]27/23. Discharged with hydrocodone  5 mg every six hours as needed.   History of fibrocystic breast disease, diverticulosis of the colon status post laparoscopic colon resection.   Patient had tonsillectomy in the 1950s.  In 1994, she had a tubal repair laparotomy.  She had 3 ectopic pregnancies 1992, 1993 1995 age resulting in laparotomy.   Labs 10/29/2024  TSH 0.32,  Triglycerides 171, LDL 102, Otherwise WNL.   12/01/2022 Mammogram no mammographic evidence of malignancy. Repeat in one year.     05/02/2012 Colonoscopy. Moderate diverticulosis, Otherwise normal examination. Repeat in 10 years.   Health Maintenance: Hepatitis C screening discontinued.  Health Maintenance  Topic Date Due   Medicare Annual Wellness (AWV)  10/30/2024   Hepatitis C Screening  11/01/2025 (Originally 12/25/1963)   COVID-19 Vaccine (10 - 2025-26 season) 03/07/2025   DTaP/Tdap/Td (3 - Td or Tdap) 08/01/2033   Pneumococcal Vaccine: 50+ Years  Completed   Influenza Vaccine  Completed   Bone Density Scan  Completed   Zoster Vaccines- Shingrix  Completed   Meningococcal B Vaccine  Aged Out   Mammogram  Discontinued   Colonoscopy  Discontinued     Review of Systems  Constitutional:  Negative for fever and malaise/fatigue.  HENT:  Negative for congestion.   Eyes:  Negative for blurred vision.  Respiratory:  Negative for cough and  shortness of breath.   Cardiovascular:  Negative for chest pain, palpitations and leg swelling.  Gastrointestinal:  Negative for vomiting.  Musculoskeletal:  Negative for back pain.  Skin:  Negative for rash.  Neurological:  Negative for loss of consciousness and  headaches.   Objective:  Vitals: body mass index is 26.34 kg/m. Today's Vitals   11/01/24 1503  BP: 134/72  Pulse: (!) 58  SpO2: 96%  Weight: 144 lb (65.3 kg)  Height: 5' 2 (1.575 m)   Physical Exam Vitals and nursing note reviewed.  Constitutional:      General: She is not in acute distress.    Appearance: Normal appearance. She is not ill-appearing or toxic-appearing.  HENT:     Head: Normocephalic and atraumatic.     Right Ear: Hearing, tympanic membrane, ear canal and external ear normal.     Left Ear: Hearing, tympanic membrane, ear canal and external ear normal.     Mouth/Throat:     Pharynx: Oropharynx is clear.  Eyes:     Extraocular Movements: Extraocular movements intact.     Pupils: Pupils are equal, round, and reactive to light.  Neck:     Thyroid : No thyroid  mass, thyromegaly or thyroid  tenderness.     Vascular: No carotid bruit.  Cardiovascular:     Rate and Rhythm: Normal rate and regular rhythm. No extrasystoles are present.    Pulses:          Dorsalis pedis pulses are 2+ on the right side and 2+ on the left side.     Heart sounds: Normal heart sounds. No murmur heard.    No friction rub. No gallop.  Pulmonary:     Effort: Pulmonary effort is normal.     Breath sounds: Normal breath sounds. No decreased breath sounds, wheezing, rhonchi or rales.  Chest:     Chest wall: No mass.  Abdominal:     Palpations: Abdomen is soft. There is no hepatomegaly, splenomegaly or mass.     Tenderness: There is no abdominal tenderness.     Hernia: No hernia is present.  Genitourinary:    Comments: GYN deferred.  Musculoskeletal:     Cervical back: Normal range of motion.     Right lower leg: No edema.     Left lower leg: No edema.  Lymphadenopathy:     Cervical: No cervical adenopathy.     Upper Body:     Right upper body: No supraclavicular adenopathy.     Left upper body: No supraclavicular adenopathy.  Skin:    General: Skin is warm and dry.  Neurological:      General: No focal deficit present.     Mental Status: She is alert and oriented to person, place, and time. Mental status is at baseline.     Sensory: Sensation is intact.     Motor: Motor function is intact. No weakness.     Deep Tendon Reflexes: Reflexes are normal and symmetric.  Psychiatric:        Attention and Perception: Attention normal.        Mood and Affect: Mood normal.        Speech: Speech normal.        Behavior: Behavior normal.        Thought Content: Thought content normal.        Cognition and Memory: Cognition normal.        Judgment: Judgment normal.     Current Outpatient Medications  Medication Instructions   chlorhexidine  (  HIBICLENS ) 4 % external liquid To use for daily bathing/showering for 6 months total   chlorhexidine  (PERIDEX ) 0.12 % solution Use 15ml twice daily for 5 days, twice each month for 6 months total   cholecalciferol  (VITAMIN D ) 1,000 Units, Daily   dorzolamide -timolol  (COSOPT ) 22.3-6.8 MG/ML ophthalmic solution 1 drop, 2 times daily   HYDROcodone -acetaminophen  (NORCO/VICODIN) 5-325 MG tablet 1 tablet, Oral, Every 6 hours PRN   levofloxacin  (LEVAQUIN ) 500 mg, Oral, Daily   levothyroxine  (SYNTHROID ) 75 mcg, Oral, Daily   mupirocin  cream (BACTROBAN ) 2 % Apply nasally, twice daily for 5 days, twice each month for 6 months total   ondansetron  (ZOFRAN ) 4 mg, Oral, Every 8 hours PRN   simvastatin  (ZOCOR ) 20 MG tablet TAKE 1 TABLET(20 MG) BY MOUTH AT BEDTIME   simvastatin  (ZOCOR ) 20 MG tablet TAKE 1 TABLET(20 MG) BY MOUTH AT BEDTIME   sulfamethoxazole -trimethoprim  (BACTRIM  DS) 800-160 MG tablet 1 tablet, Oral, 2 times daily   zolpidem  (AMBIEN ) 5 mg, Oral, At bedtime PRN   Past Medical History:  Diagnosis Date   Colon polyps    Complication of anesthesia 1980's   laparoscopic procedure invitro, bradycardia and heart stopped at [facility]   Diverticulitis    Diverticulosis    Fibrocystic breast disease    Hyperlipidemia    Hypertension     elevated bp in past, no current meds   Hypothyroidism    Osteopenia    Tubal ectopic pregnancy    x 3   Vaginal atrophy    Vitamin D  deficiency    Medical/Surgical History Narrative:  Allergic/Intolerant to:  Allergies  Allergen Reactions   Succinylcholine Other (See Comments)    BRADYCARDIA/  ASYSTOLE 1980's   Boniva [Ibandronate Sodium]     GI   Morphine  And Codeine Nausea And Vomiting   Penicillins Hives    Past Surgical History:  Procedure Laterality Date   BREAST CYST ASPIRATION  11/29/02   ECTOPIC PREGNANCY SURGERY  1980s   x3   FERTILITY SURGERY     x4 IVF   PARTIAL COLECTOMY     recurrent diverticulitis   tubal repair  1980's   WRIST FRACTURE SURGERY Left 1987   Family History  Problem Relation Age of Onset   Colon cancer Paternal Uncle    Cancer Paternal Uncle        Colon   Bladder Cancer Paternal Uncle    Cancer Paternal Uncle        Prostate/Bladder   Diabetes Father    Heart disease Father    Stroke Father    Hypertension Mother    Cancer Mother        Lung   Prostate cancer Paternal Uncle    Skin cancer Paternal Uncle    Diabetes Other        father's side juvenile and adult onset   Cancer Paternal Grandfather        Skin/Stomach   Family History Narrative: Father died in 2000/11/29 with coronary disease, CVA, MI diabetes. Mother died age 57 in 78 with history of hypertension and lung cancer. 3 brothers and 1 sister in good health. No children. Patient had a cousin who died of the sudden cardiac arrhythmia due to right ventricular dysplasia/cardiomyopathy. Patient went to [facility] and was screened for that condition and was found not to have it. She says several family members have tested positive for the gene.   She is retired. 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  She is married. No history of smoking. Occasional wine consumption. She enjoys sewing. She enjoys reading.    Most Recent Health Risks Assessment:   Most  Recent Social Determinants of Health (Including Hx of Tobacco, Alcohol, and Drug Use) SDOH Screenings   Food Insecurity: No Food Insecurity (10/28/2024)  Housing: Low Risk  (11/01/2024)  Transportation Needs: No Transportation Needs (11/01/2024)  Utilities: Not At Risk (11/01/2024)  Alcohol Screen: Low Risk  (10/28/2024)  Depression (PHQ2-9): Low Risk  (11/01/2024)  Financial Resource Strain: Low Risk  (10/28/2024)  Physical Activity: Sufficiently Active (10/28/2024)  Social Connections: Moderately Integrated (10/28/2024)  Stress: No Stress Concern Present (10/28/2024)  Tobacco Use: Low Risk  (11/03/2023)  Health Literacy: Adequate Health Literacy (11/01/2024)   Social History   Tobacco Use   Smoking status: Never   Smokeless tobacco: Never  Vaping Use   Vaping status: Never Used  Substance Use Topics   Alcohol use: Yes    Comment: Wine   Drug use: No   Most Recent Functional Status Assessment:    10/28/2024    2:07 PM  In your present state of health, do you have any difficulty performing the following activities:  Vision? 0  Difficulty concentrating or making decisions? 0  Walking or climbing stairs? 0  Dressing or bathing? 0  Doing errands, shopping? 0  Preparing Food and eating ? N  Using the Toilet? N  In the past six months, have you accidently leaked urine? Y  Do you have problems with loss of bowel control? N  Managing your Medications? N  Housekeeping or managing your Housekeeping? N   Most Recent Fall Risk Assessment:    11/01/2024    2:58 PM  Fall Risk   Falls in the past year? 0  Number falls in past yr: 0  Injury with Fall? 0  Follow up Falls evaluation completed   Most Recent Anxiety/Depression Screenings:    11/01/2024    3:07 PM 11/03/2023    1:14 PM  PHQ 2/9 Scores  PHQ - 2 Score 0 0    Most Recent Cognitive Screening:    10/31/2023    2:37 PM  6CIT Screen  What Year? 0 points  What month? 0 points  What time? 0 points  Count back from 20  0 points  Months in reverse 0 points  Repeat phrase 0 points  Total Score 0 points   Most Recent Vision/Hearing Screenings: Hearing Screening   250Hz  500Hz  2000Hz  3000Hz  4000Hz   Right ear Pass Pass Pass Pass Pass  Left ear Pass Pass Pass Pass Pass   Results:  Studies Obtained And Personally Reviewed By Me:   12/01/2022 Mammogram no mammographic evidence of malignancy. Repeat in one year.     05/02/2012 Colonoscopy. Moderate diverticulosis, Otherwise normal examination. Repeat in 10 years.   Labs:  CBC w/ Differential Lab Results  Component Value Date   WBC 4.5 10/29/2024   RBC 4.69 10/29/2024   HGB 14.2 10/29/2024   HCT 43.0 10/29/2024   PLT 267 10/29/2024   MCV 91.7 10/29/2024   MCH 30.3 10/29/2024   MCHC 33.0 10/29/2024   RDW 12.6 10/29/2024   MPV 11.8 10/29/2024   LYMPHSABS 1,832 07/21/2023   MONOABS 0.7 07/11/2023   BASOSABS 32 10/29/2024    Comprehensive Metabolic Panel Lab Results  Component Value Date   NA 136 10/29/2024   K 4.8 10/29/2024   CL 102 10/29/2024   CO2 27 10/29/2024   GLUCOSE 95 10/29/2024   BUN 15  10/29/2024   CREATININE 0.67 10/29/2024   CALCIUM  9.4 10/29/2024   PROT 6.9 10/29/2024   ALBUMIN 2.9 (L) 07/16/2023   AST 15 10/29/2024   ALT 14 10/29/2024   ALKPHOS 58 07/11/2023   BILITOT 0.6 10/29/2024   EGFR 91 10/29/2024   GFRNONAA >60 07/16/2023   Lipid Panel  Lab Results  Component Value Date   CHOL 193 10/29/2024   HDL 64 10/29/2024   LDLCALC 102 (H) 10/29/2024   TRIG 171 (H) 10/29/2024   A1c Lab Results  Component Value Date   HGBA1C 5.6 10/29/2024    TSH Lab Results  Component Value Date   TSH 0.32 (L) 10/29/2024   Assessment & Plan:   Meds ordered this encounter  Medications   levothyroxine  (SYNTHROID ) 75 MCG tablet    Sig: Take 1 tablet (75 mcg total) by mouth daily.    Dispense:  90 tablet    Refill:  0   Hypothyroidism treated with levothyroxine  88 mcg daily.  10/29/2024 TSH 0.32.    Levothyroxine   decreased from 88 mcg to 75 mcg.    TSH recheck in 6 weeks.    Hyperlipidemia: treated with simvastatin  20 mg daily. 10/29/2024 Lipid panel Triglycerides 171, LDL 102.   12/01/2022 Mammogram no mammographic evidence of malignancy. Repeat in one year.     05/02/2012 Colonoscopy. Moderate diverticulosis, Otherwise normal examination. Repeat in 10 years.   Health Maintenance: Hepatitis C screening discontinued.     Annual Wellness Visit done today including the all of the following: Reviewed patient's Family Medical History Reviewed patient's SDOH and reviewed tobacco, alcohol, and drug use.  Reviewed and updated list of patient's medical providers Assessment of cognitive impairment was done Assessed patient's functional ability Established a written schedule for health screening services Health Risk Assessent Completed and Reviewed  Discussed health benefits of physical activity, and encouraged her to engage in regular exercise appropriate for her age and condition.    I,Makayla C Reid,acting as a scribe for Ronal JINNY Hailstone, MD.,have documented all relevant documentation on the behalf of Ronal JINNY Hailstone, MD,as directed by  Ronal JINNY Hailstone, MD while in the presence of Ronal JINNY Hailstone, MD.  I, Ronal JINNY Hailstone, MD, have reviewed all documentation for and agree with the above Annual Wellness Visit documentation.  Ronal JINNY Hailstone, MD Internal Medicine 11/01/2024

## 2024-10-22 NOTE — Telephone Encounter (Signed)
Left patient voicemail to call back and schedule

## 2024-10-23 ENCOUNTER — Encounter: Payer: Self-pay | Admitting: Internal Medicine

## 2024-10-23 DIAGNOSIS — H401122 Primary open-angle glaucoma, left eye, moderate stage: Secondary | ICD-10-CM | POA: Diagnosis not present

## 2024-10-29 ENCOUNTER — Other Ambulatory Visit: Payer: Medicare Other

## 2024-10-29 DIAGNOSIS — Z0000 Encounter for general adult medical examination without abnormal findings: Secondary | ICD-10-CM | POA: Diagnosis not present

## 2024-10-29 DIAGNOSIS — E782 Mixed hyperlipidemia: Secondary | ICD-10-CM

## 2024-10-29 DIAGNOSIS — E7800 Pure hypercholesterolemia, unspecified: Secondary | ICD-10-CM

## 2024-10-29 DIAGNOSIS — M8580 Other specified disorders of bone density and structure, unspecified site: Secondary | ICD-10-CM

## 2024-10-29 DIAGNOSIS — R7302 Impaired glucose tolerance (oral): Secondary | ICD-10-CM | POA: Diagnosis not present

## 2024-10-29 DIAGNOSIS — E039 Hypothyroidism, unspecified: Secondary | ICD-10-CM | POA: Diagnosis not present

## 2024-10-29 LAB — COMPREHENSIVE METABOLIC PANEL WITH GFR
AG Ratio: 1.9 (calc) (ref 1.0–2.5)
ALT: 14 U/L (ref 6–29)
AST: 15 U/L (ref 10–35)
Albumin: 4.5 g/dL (ref 3.6–5.1)
Alkaline phosphatase (APISO): 53 U/L (ref 37–153)
BUN: 15 mg/dL (ref 7–25)
CO2: 27 mmol/L (ref 20–32)
Calcium: 9.4 mg/dL (ref 8.6–10.4)
Chloride: 102 mmol/L (ref 98–110)
Creat: 0.67 mg/dL (ref 0.60–1.00)
Globulin: 2.4 g/dL (ref 1.9–3.7)
Glucose, Bld: 95 mg/dL (ref 65–99)
Potassium: 4.8 mmol/L (ref 3.5–5.3)
Sodium: 136 mmol/L (ref 135–146)
Total Bilirubin: 0.6 mg/dL (ref 0.2–1.2)
Total Protein: 6.9 g/dL (ref 6.1–8.1)
eGFR: 91 mL/min/1.73m2 (ref >=60)

## 2024-10-29 LAB — CBC WITH DIFFERENTIAL/PLATELET
Absolute Lymphocytes: 1949 {cells}/uL (ref 850–3900)
Absolute Monocytes: 342 {cells}/uL (ref 200–950)
Basophils Absolute: 32 {cells}/uL (ref 0–200)
Basophils Relative: 0.7 %
Eosinophils Absolute: 59 {cells}/uL (ref 15–500)
Eosinophils Relative: 1.3 %
HCT: 43 % (ref 35.9–46.0)
Hemoglobin: 14.2 g/dL (ref 11.7–15.5)
MCH: 30.3 pg (ref 27.0–33.0)
MCHC: 33 g/dL (ref 31.6–35.4)
MCV: 91.7 fL (ref 81.4–101.7)
MPV: 11.8 fL (ref 7.5–12.5)
Monocytes Relative: 7.6 %
Neutro Abs: 2120 {cells}/uL (ref 1500–7800)
Neutrophils Relative %: 47.1 %
Platelets: 267 Thousand/uL (ref 140–400)
RBC: 4.69 Million/uL (ref 3.80–5.10)
RDW: 12.6 % (ref 11.0–15.0)
Total Lymphocyte: 43.3 %
WBC: 4.5 Thousand/uL (ref 3.8–10.8)

## 2024-10-29 LAB — LIPID PANEL
Cholesterol: 193 mg/dL (ref <200)
HDL: 64 mg/dL (ref >=50)
LDL Cholesterol (Calc): 102 mg/dL — ABNORMAL HIGH
Non-HDL Cholesterol (Calc): 129 mg/dL (ref <130)
Total CHOL/HDL Ratio: 3 (calc) (ref <5.0)
Triglycerides: 171 mg/dL — ABNORMAL HIGH (ref <150)

## 2024-10-29 LAB — HEMOGLOBIN A1C
Hgb A1c MFr Bld: 5.6 % (ref <5.7)
Mean Plasma Glucose: 114 mg/dL
eAG (mmol/L): 6.3 mmol/L

## 2024-10-29 LAB — TSH: TSH: 0.32 m[IU]/L — ABNORMAL LOW (ref 0.40–4.50)

## 2024-11-01 ENCOUNTER — Ambulatory Visit: Payer: Medicare Other | Admitting: Internal Medicine

## 2024-11-01 ENCOUNTER — Ambulatory Visit: Payer: Self-pay | Admitting: Internal Medicine

## 2024-11-01 VITALS — BP 134/72 | HR 58 | Ht 62.0 in | Wt 144.0 lb

## 2024-11-01 DIAGNOSIS — M8580 Other specified disorders of bone density and structure, unspecified site: Secondary | ICD-10-CM

## 2024-11-01 DIAGNOSIS — Z8669 Personal history of other diseases of the nervous system and sense organs: Secondary | ICD-10-CM

## 2024-11-01 DIAGNOSIS — S92342D Displaced fracture of fourth metatarsal bone, left foot, subsequent encounter for fracture with routine healing: Secondary | ICD-10-CM

## 2024-11-01 DIAGNOSIS — Z0001 Encounter for general adult medical examination with abnormal findings: Secondary | ICD-10-CM

## 2024-11-01 DIAGNOSIS — Z9049 Acquired absence of other specified parts of digestive tract: Secondary | ICD-10-CM

## 2024-11-01 DIAGNOSIS — E7800 Pure hypercholesterolemia, unspecified: Secondary | ICD-10-CM

## 2024-11-01 DIAGNOSIS — Z87898 Personal history of other specified conditions: Secondary | ICD-10-CM

## 2024-11-01 DIAGNOSIS — E559 Vitamin D deficiency, unspecified: Secondary | ICD-10-CM

## 2024-11-01 DIAGNOSIS — Z0000 Encounter for general adult medical examination without abnormal findings: Secondary | ICD-10-CM

## 2024-11-01 DIAGNOSIS — E785 Hyperlipidemia, unspecified: Secondary | ICD-10-CM | POA: Diagnosis not present

## 2024-11-01 DIAGNOSIS — E039 Hypothyroidism, unspecified: Secondary | ICD-10-CM

## 2024-11-01 DIAGNOSIS — E782 Mixed hyperlipidemia: Secondary | ICD-10-CM

## 2024-11-01 DIAGNOSIS — N762 Acute vulvitis: Secondary | ICD-10-CM

## 2024-11-01 LAB — POC URINALSYSI DIPSTICK (AUTOMATED)
Bilirubin, UA: NEGATIVE
Glucose, UA: NEGATIVE
Ketones, UA: NEGATIVE
Nitrite, UA: NEGATIVE
Protein, UA: NEGATIVE
Spec Grav, UA: 1.01 (ref 1.010–1.025)
Urobilinogen, UA: 0.2 U/dL
pH, UA: 6 (ref 5.0–8.0)

## 2024-11-01 MED ORDER — LEVOTHYROXINE SODIUM 75 MCG PO TABS: 75.0000 ug | ORAL_TABLET | Freq: Every day | ORAL | 0 refills | Status: AC

## 2024-11-17 ENCOUNTER — Encounter: Payer: Self-pay | Admitting: Internal Medicine

## 2024-11-17 NOTE — Patient Instructions (Signed)
 It was a pleasure to see you today.  Medical events over the past year discussed. Labs are stable. Continue diet and exercise regimen. Return in one year or as needed. 'Pamela [name],  Thank you for taking the time for your Medicare Wellness Visit. I appreciate your continued commitment to your health goals. Please review the care plan we discussed, and feel free to reach out if I can assist you further.  Please note that Annual Wellness Visits do not include a physical exam. Some assessments may be limited, especially if the visit was conducted virtually. If needed, we may recommend an in-person follow-up with your provider.  Ongoing Care Seeing your primary care provider every 3 to 6 months helps us  monitor your health and provide consistent, personalized care.   Referrals If a referral was made during today's visit and you haven't received any updates within two weeks, please contact the referred provider directly to check on the status.  Recommended Screenings:  Health Maintenance  Topic Date Due   Hepatitis C Screening  11/01/2025*   COVID-19 Vaccine (10 - 2025-26 season) 03/07/2025   Medicare Annual Wellness Visit  11/01/2025   DTaP/Tdap/Td vaccine (3 - Td or Tdap) 08/01/2033   Pneumococcal Vaccine for age over 16  Completed   Flu Shot  Completed   Osteoporosis screening with Bone Density Scan  Completed   Zoster (Shingles) Vaccine  Completed   Meningitis B Vaccine  Aged Out   Breast Cancer Screening  Discontinued   Colon Cancer Screening  Discontinued  *Topic was postponed. The date shown is not the original due date.       11/01/2024    2:58 PM  Advanced Directives  Does Patient Have a Medical Advance Directive? Yes  Type of Estate Agent of Combine;Living will  Does patient want to make changes to medical advance directive? No - Patient declined  Copy of Healthcare Power of Attorney in Chart? No - copy requested    Vision: Annual vision screenings  are recommended for early detection of glaucoma, cataracts, and diabetic retinopathy. These exams can also reveal signs of chronic conditions such as diabetes and high blood pressure.  Dental: Annual dental screenings help detect early signs of oral cancer, gum disease, and other conditions linked to overall health, including heart disease and diabetes.  Please see the attached documents for additional preventive care recommendations.

## 2024-11-19 NOTE — Progress Notes (Signed)
 "  Chief Complaint  Patient presents with   Annual Exam     Subjective:   Pamela [name] is a 78 y.o. female who presents for a Medicare Annual Wellness Visit.  Visit info / Clinical Intake: Medicare Wellness Visit Type:: Subsequent Annual Wellness Visit Persons participating in visit and providing information:: patient Medicare Wellness Visit Mode:: In-person (required for WTM) Pre-visit prep was completed: no AWV questionnaire completed by patient prior to visit?: no Living arrangements:: lives with spouse/significant other Patient's Overall Health Status Rating: very good Typical amount of pain: none Does pain affect daily life?: no Are you currently prescribed opioids?: no  Dietary Habits and Nutritional Risks How many meals a day?: 3 Eats fruit and vegetables daily?: yes Most meals are obtained by: preparing own meals; eating out In the last 2 weeks, have you had any of the following?: none Diabetic:: no  Functional Status Activities of Daily Living (to include ambulation/medication): Independent Ambulation: Independent Medication Administration: Independent Home Management (perform basic housework or laundry): Independent Manage your own finances?: yes Primary transportation is: driving Concerns about vision?: (!) yes Concerns about hearing?: no  Fall Screening Falls in the past year?: 0 Number of falls in past year: 0 Was there an injury with Fall?: 0 Fall Risk Category Calculator: 0 Patient Fall Risk Level: Low Fall Risk  Fall Risk Fall risk Follow up: Falls evaluation completed  Home and Transportation Safety: All rugs have non-skid backing?: yes All stairs or steps have railings?: yes Grab bars in the bathtub or shower?: yes Have non-skid surface in bathtub or shower?: yes Good home lighting?: yes Regular seat belt use?: yes Hospital stays in the last year:: no  Cognitive Assessment Difficulty concentrating, remembering, or making decisions? :  no Will 6CIT or Mini Cog be Completed: no 6CIT or Mini Cog Declined: patient alert, oriented, able to answer questions appropriately and recall recent events  Advance Directives (For Healthcare) Does Patient Have a Medical Advance Directive?: Yes Does patient want to make changes to medical advance directive?: No - Patient declined Type of Advance Directive: Healthcare Power of [place]; Living will Copy of Healthcare Power of Attorney in Chart?: No - copy requested Copy of Living Will in Chart?: No - copy requested  Reviewed/Updated  Reviewed/Updated: Reviewed All (Medical, Surgical, Family, Medications, Allergies, Care Teams, Patient Goals)    Allergies (verified) Succinylcholine, Boniva [ibandronate sodium], Morphine  and codeine, and Penicillins   Current Medications (verified) Outpatient Encounter Medications as of 11/01/2024  Medication Sig   cholecalciferol  (VITAMIN D ) 1000 UNITS tablet Take 1,000 Units by mouth daily.   dorzolamide -timolol  (COSOPT ) 22.3-6.8 MG/ML ophthalmic solution Place 1 drop into both eyes 2 (two) times daily.   [EXPIRED] levofloxacin  (LEVAQUIN ) 500 MG tablet Take 1 tablet (500 mg total) by mouth daily for 7 days.   levothyroxine  (SYNTHROID ) 75 MCG tablet Take 1 tablet (75 mcg total) by mouth daily.   simvastatin  (ZOCOR ) 20 MG tablet TAKE 1 TABLET(20 MG) BY MOUTH AT BEDTIME   zolpidem  (AMBIEN ) 5 MG tablet Take 1 tablet (5 mg total) by mouth at bedtime as needed for sleep.   [DISCONTINUED] levothyroxine  (SYNTHROID ) 88 MCG tablet TAKE 1 TABLET(88 MCG) BY MOUTH DAILY   chlorhexidine  (HIBICLENS ) 4 % external liquid To use for daily bathing/showering for 6 months total   chlorhexidine  (PERIDEX ) 0.12 % solution Use 15ml twice daily for 5 days, twice each month for 6 months total   HYDROcodone -acetaminophen  (NORCO/VICODIN) 5-325 MG tablet Take 1 tablet by mouth every 6 (six)  hours as needed (pain).   mupirocin  cream (BACTROBAN ) 2 % Apply nasally, twice daily for 5  days, twice each month for 6 months total   ondansetron  (ZOFRAN ) 4 MG tablet Take 1 tablet (4 mg total) by mouth every 8 (eight) hours as needed for nausea or vomiting.   simvastatin  (ZOCOR ) 20 MG tablet TAKE 1 TABLET(20 MG) BY MOUTH AT BEDTIME   sulfamethoxazole -trimethoprim  (BACTRIM  DS) 800-160 MG tablet Take 1 tablet by mouth 2 (two) times daily.   [DISCONTINUED] levothyroxine  (SYNTHROID ) 88 MCG tablet TAKE 1 TABLET(88 MCG) BY MOUTH DAILY   [DISCONTINUED] simvastatin  (ZOCOR ) 20 MG tablet Take 1 tablet (20 mg total) by mouth at bedtime.   [DISCONTINUED] sulfamethoxazole -trimethoprim  (BACTRIM  DS) 800-160 MG tablet Take 1 tablet by mouth 2 (two) times daily.   No facility-administered encounter medications on file as of 11/01/2024.    History: Past Medical History:  Diagnosis Date   Colon polyps    Complication of anesthesia 1980's   laparoscopic procedure invitro, br[facility] and heart [facility]   Diverticulitis    Diverticulosis    Fibrocystic breast disease    Hyperlipidemia    Hypertension    elevated bp in past, no current meds   Hypothyroidism    Osteopenia    Tubal ectopic pregnancy    x 3   Vaginal atrophy    Vitamin D  deficiency    Past Surgical History:  Procedure Laterality Date   BREAST CYST ASPIRATION  2003   ECTOPIC PREGNANCY SURGERY  1980s   x3   FERTILITY SURGERY     x4 IVF   PARTIAL COLECTOMY     recurrent diverticulitis   tubal repair  1980's   WRIST FRACTURE SURGERY Left 1987   Family History  Problem Relation Age of Onset   Colon cancer Paternal Uncle    Cancer Paternal Uncle        Colon   Bladder Cancer Paternal Uncle    Cancer Paternal Uncle        Prostate/Bladder   Diabetes Father    Heart disease Father    Stroke Father    Hypertension Mother    Cancer Mother        Lung   Prostate cancer Paternal Uncle    Skin cancer Paternal Uncle    Diabetes Other        father's side juvenile and adult onset   Cancer Paternal Grandfather         Skin/Stomach   Social History   Occupational History   Occupation: Agricultural Engineer: [employer]  Tobacco Use   Smoking status: Never   Smokeless tobacco: Never  Vaping Use   Vaping status: Never Used  Substance and Sexual Activity   Alcohol use: Yes    Comment: Wine   Drug use: No   Sexual activity: Not Currently   Tobacco Counseling Counseling given: Not Answered  SDOH Screenings   Food Insecurity: No Food Insecurity (10/28/2024)  Housing: Low Risk (11/01/2024)  Transportation Needs: No Transportation Needs (11/01/2024)  Utilities: Not At Risk (11/01/2024)  Alcohol Screen: Low Risk (10/28/2024)  Depression (PHQ2-9): Low Risk (11/01/2024)  Financial Resource Strain: Low Risk (10/28/2024)  Physical Activity: Sufficiently Active (10/28/2024)  Social Connections: Moderately Integrated (10/28/2024)  Stress: No Stress Concern Present (10/28/2024)  Tobacco Use: Low Risk (11/17/2024)  Health Literacy: Adequate Health Literacy (11/01/2024)   See flowsheets for full screening details  Depression Screen PHQ 2 & 9 Depression Scale- Over the past 2 weeks, how  often have you been bothered by any of the following problems? Little interest or pleasure in doing things: 0 Feeling down, depressed, or hopeless (PHQ Adolescent also includes...irritable): 0 PHQ-2 Total Score: 0     Goals Addressed   None          Objective:    Today's Vitals   11/01/24 1503  BP: 134/72  Pulse: (!) 58  SpO2: 96%  Weight: 144 lb (65.3 kg)  Height: 5' 2 (1.575 m)   Body mass index is 26.34 kg/m.  Hearing/Vision screen Hearing Screening   250Hz  500Hz  2000Hz  3000Hz  4000Hz   Right ear Pass Pass Pass Pass Pass  Left ear Pass Pass Pass Pass Pass   Immunizations and Health Maintenance Health Maintenance  Topic Date Due   Hepatitis C Screening  11/01/2025 (Originally 12/25/1963)   COVID-19 Vaccine (10 - 2025-26 season) 03/07/2025   Medicare Annual Wellness (AWV)  11/01/2025    DTaP/Tdap/Td (3 - Td or Tdap) 08/01/2033   Pneumococcal Vaccine: 50+ Years  Completed   Influenza Vaccine  Completed   Bone Density Scan  Completed   Zoster Vaccines- Shingrix  Completed   Meningococcal B Vaccine  Aged Out   Mammogram  Discontinued   Colonoscopy  Discontinued        Assessment/Plan:  This is a routine wellness examination for Pamela [name].  Patient Care Team: Perri Ronal PARAS, MD as PCP - General (Internal Medicine) Obie Princella HERO, MD (Inactive) as Consulting Physician (Gastroenterology) Sheldon Standing, MD as Consulting Physician (General Surgery)  I have personally reviewed and noted the following in the patients chart:   Medical and social history Use of alcohol, tobacco or illicit drugs  Current medications and supplements including opioid prescriptions. Functional ability and status Nutritional status Physical activity Advanced directives List of other physicians Hospitalizations, surgeries, and ER visits in previous 12 months Vitals Screenings to include cognitive, depression, and falls Referrals and appointments  Orders Placed This Encounter  Procedures   POCT Urinalysis Dipstick (Automated)   In addition, I have reviewed and discussed with patient certain preventive protocols, quality metrics, and best practice recommendations. A written personalized care plan for preventive services as well as general preventive health recommendations were provided to patient.   Pamela [name], CMA   11/19/2024   Return in about 1 year (around 11/01/2025).  After Visit Summary: (In Person-Printed) AVS printed and given to the patient  Nurse Notes: none  I, Ronal PARAS Perri, MD, have reviewed all documentation for this visit. The documentation on 11/01/2024 for the exam, diagnosis, procedures, and orders are all accurate and complete.  "

## 2024-11-22 ENCOUNTER — Encounter: Payer: Self-pay | Admitting: Internal Medicine

## 2024-11-30 ENCOUNTER — Encounter

## 2024-12-03 ENCOUNTER — Ambulatory Visit

## 2024-12-03 VITALS — Ht 62.0 in | Wt 140.0 lb

## 2024-12-03 DIAGNOSIS — Z1211 Encounter for screening for malignant neoplasm of colon: Secondary | ICD-10-CM

## 2024-12-03 MED ORDER — PEG 3350-KCL-NA BICARB-NACL 420 G PO SOLR: 4000.0000 mL | Freq: Once | ORAL | 0 refills | Status: AC

## 2024-12-03 NOTE — Progress Notes (Signed)
 PCP MD at time of PV: Perri Ronal PARAS, MD  __________________________________________________________________________________________________________________________________________  No egg allergy known to patient  No soy allergy known to patient No issues known to pt with past sedation with any surgeries or procedures Patient denies ever being told they had issues or difficulty with intubation  No FH of Malignant Hyperthermia Pt is not on diet pills Pt is not on  home 02  Pt is not on blood thinners  No A fib or A flutter Have any cardiac testing pending-- no  LOA: independent  No Chew or Snuff tobacco __________________________________________________________________________________________________________________________________________  Constipation: no  Prep:  golytely  __________________________________________________________________________________________________________________________________________  PV completed with patient. Prep instructions reviewed and provided during apt. Rx sent to preferred pharmacy.  __________________________________________________________________________________________________________________________________________  Patient's chart reviewed by Norleen Schillings CNRA prior to previsit and patient appropriate for the [facility].  Previsit completed and red dot placed by patient's name on their procedure day (on provider's schedule).

## 2024-12-10 ENCOUNTER — Encounter

## 2024-12-13 ENCOUNTER — Encounter: Payer: Self-pay | Admitting: Internal Medicine

## 2024-12-14 ENCOUNTER — Other Ambulatory Visit

## 2024-12-14 ENCOUNTER — Encounter: Admitting: Gastroenterology

## 2024-12-14 DIAGNOSIS — E039 Hypothyroidism, unspecified: Secondary | ICD-10-CM

## 2024-12-14 DIAGNOSIS — M8580 Other specified disorders of bone density and structure, unspecified site: Secondary | ICD-10-CM

## 2024-12-15 LAB — TSH: TSH: 3.06 m[IU]/L (ref 0.40–4.50)

## 2024-12-16 ENCOUNTER — Ambulatory Visit: Payer: Self-pay | Admitting: Internal Medicine

## 2024-12-18 ENCOUNTER — Ambulatory Visit
Admission: RE | Admit: 2024-12-18 | Discharge: 2024-12-18 | Disposition: A | Source: Ambulatory Visit | Attending: Internal Medicine | Admitting: Internal Medicine

## 2024-12-18 DIAGNOSIS — Z1231 Encounter for screening mammogram for malignant neoplasm of breast: Secondary | ICD-10-CM

## 2024-12-21 ENCOUNTER — Telehealth: Payer: Self-pay

## 2024-12-21 NOTE — Telephone Encounter (Signed)
 Procedure rescheduled , new prep instruction sent to patient via My Chart. Encouraged patient to call with questions.

## 2024-12-24 ENCOUNTER — Encounter: Admitting: Internal Medicine

## 2025-01-04 ENCOUNTER — Encounter: Payer: Self-pay | Admitting: Internal Medicine

## 2025-01-04 ENCOUNTER — Ambulatory Visit: Admitting: Internal Medicine

## 2025-01-04 VITALS — BP 101/71 | HR 56 | Temp 97.2°F | Resp 16 | Wt 140.0 lb

## 2025-01-04 DIAGNOSIS — Z1211 Encounter for screening for malignant neoplasm of colon: Secondary | ICD-10-CM

## 2025-01-04 DIAGNOSIS — D122 Benign neoplasm of ascending colon: Secondary | ICD-10-CM

## 2025-01-04 DIAGNOSIS — D124 Benign neoplasm of descending colon: Secondary | ICD-10-CM

## 2025-01-04 DIAGNOSIS — D123 Benign neoplasm of transverse colon: Secondary | ICD-10-CM

## 2025-01-04 MED ORDER — SODIUM CHLORIDE 0.9 % IV SOLN: 500.0000 mL | Freq: Once | INTRAVENOUS | Status: DC

## 2025-01-04 NOTE — Progress Notes (Signed)
 "   GASTROENTEROLOGY PROCEDURE H&P NOTE   Primary Care Physician: Perri Ronal PARAS, MD    Reason for Procedure:   Colon cancer screening  Plan:    Colonoscopy  Patient is appropriate for endoscopic procedure(s) in the ambulatory ([facility]) setting.  The nature of the procedure, as well as the risks, benefits, and alternatives were carefully and thoroughly reviewed with the patient. Ample time for discussion and questions allowed. The patient understood, was satisfied, and agreed to proceed.     HPI: Pamela [name] is a 79 y.o. female who presents for colonoscopy for colon cancer screening. Denies blood in stools, changes in bowel habits, or unintentional weight loss. Uncle and grandfather had colon cancer.  Colonoscopy 05/05/12: Moderate diverticulosis in sigmoid   Past Medical History:  Diagnosis Date   Colon polyps    Complication of anesthesia 1980's   laparoscopic procedure invitro, [facility]   Diverticulitis    Diverticulosis    Fibrocystic breast disease    Hyperlipidemia    Hypertension    elevated bp in past, no current meds   Hypothyroidism    Osteopenia    Tubal ectopic pregnancy    x 3   Vaginal atrophy    Vitamin D  deficiency     Past Surgical History:  Procedure Laterality Date   BREAST CYST ASPIRATION  2003   ECTOPIC PREGNANCY SURGERY  1980s   x3   FERTILITY SURGERY     x4 IVF   PARTIAL COLECTOMY     recurrent diverticulitis   tubal repair  1980's   WRIST FRACTURE SURGERY Left 1987    Prior to Admission medications  Medication Sig Start Date End Date Taking? Authorizing Provider  cholecalciferol  (VITAMIN D ) 1000 UNITS tablet Take 1,000 Units by mouth daily. Patient taking differently: Take 1,000 Units by mouth daily. Not taking regularly    [provider]  dorzolamide -timolol  (COSOPT ) 22.3-6.8 MG/ML ophthalmic solution Place 1 drop into both eyes 2 (two) times daily. 01/28/22   [provider]   levothyroxine  (SYNTHROID ) 75 MCG tablet Take 1 tablet (75 mcg total) by mouth daily. 11/01/24   Perri Ronal PARAS, MD  simvastatin  (ZOCOR ) 20 MG tablet TAKE 1 TABLET(20 MG) BY MOUTH AT BEDTIME 10/18/24   Perri Ronal PARAS, MD  zolpidem  (AMBIEN ) 5 MG tablet Take 1 tablet (5 mg total) by mouth at bedtime as needed for sleep. 11/14/23   Perri Ronal PARAS, MD    Current Outpatient Medications  Medication Sig Dispense Refill   cholecalciferol  (VITAMIN D ) 1000 UNITS tablet Take 1,000 Units by mouth daily. (Patient taking differently: Take 1,000 Units by mouth daily. Not taking regularly)     dorzolamide -timolol  (COSOPT ) 22.3-6.8 MG/ML ophthalmic solution Place 1 drop into both eyes 2 (two) times daily.     levothyroxine  (SYNTHROID ) 75 MCG tablet Take 1 tablet (75 mcg total) by mouth daily. 90 tablet 0   simvastatin  (ZOCOR ) 20 MG tablet TAKE 1 TABLET(20 MG) BY MOUTH AT BEDTIME 90 tablet 3   zolpidem  (AMBIEN ) 5 MG tablet Take 1 tablet (5 mg total) by mouth at bedtime as needed for sleep. 12 tablet 0   Current Facility-Administered Medications  Medication Dose Route Frequency Provider Last Rate Last Admin   0.9 %  sodium chloride  infusion  500 mL Intravenous Once Federico Rosario BROCKS, MD        Allergies as of 01/04/2025 - Review Complete 01/04/2025  Allergen Reaction Noted   Succinylcholine Other (See Comments) 06/10/2011  Boniva [ibandronate sodium]  06/10/2011   Morphine  and codeine Nausea And Vomiting 06/10/2011   Penicillins Hives 06/10/2011    Family History  Problem Relation Age of Onset   Hypertension Mother    Cancer Mother        Lung   Diabetes Father    Heart disease Father    Stroke Father    Colon cancer Paternal Uncle    Cancer Paternal Uncle        Colon   Bladder Cancer Paternal Uncle    Cancer Paternal Uncle        Prostate/Bladder   Prostate cancer Paternal Uncle    Skin cancer Paternal Uncle    Cancer Paternal Grandfather        Skin/Stomach   Diabetes Other        father's  side juvenile and adult onset   Rectal cancer Neg Hx    Stomach cancer Neg Hx     Social History   Socioeconomic History   Marital status: Married    Spouse name: Not on file   Number of children: 0   Years of education: Not on file   Highest education level: Bachelor's degree (e.g., BA, AB, BS)  Occupational History   Occupation: Agricultural Engineer: [employer]  Tobacco Use   Smoking status: Never   Smokeless tobacco: Never  Vaping Use   Vaping status: Never Used  Substance and Sexual Activity   Alcohol use: Yes    Comment: Wine   Drug use: No   Sexual activity: Not Currently  Other Topics Concern   Not on file  Social History Narrative   Not on file   Social Drivers of Health   Tobacco Use: Low Risk (12/03/2024)   Patient History    Smoking Tobacco Use: Never    Smokeless Tobacco Use: Never    Passive Exposure: Not on file  Financial Resource Strain: Low Risk (10/28/2024)   Overall Financial Resource Strain (CARDIA)    Difficulty of Paying Living Expenses: Not hard at all  Food Insecurity: No Food Insecurity (10/28/2024)   Epic    Worried About Programme Researcher, Broadcasting/film/video in the Last Year: Never true    Ran Out of Food in the Last Year: Never true  Transportation Needs: No Transportation Needs (11/01/2024)   Epic    Lack of Transportation (Medical): No    Lack of Transportation (Non-Medical): No  Physical Activity: Sufficiently Active (10/28/2024)   Exercise Vital Sign    Days of Exercise per Week: 5 days    Minutes of Exercise per Session: 60 min  Stress: No Stress Concern Present (10/28/2024)   [employer] of Occupational Health - Occupational Stress Questionnaire    Feeling of Stress: Only a little  Social Connections: Moderately Integrated (10/28/2024)   Social Connection and Isolation Panel    Frequency of Communication with Friends and Family: More than three times a week    Frequency of Social Gatherings with Friends and Family: More than three times a  week    Attends Religious Services: Patient declined    Database Administrator or Organizations: Yes    Attends Banker Meetings: More than 4 times per year    Marital Status: Married  Catering Manager Violence: Not At Risk (11/01/2024)   Epic    Fear of Current or Ex-Partner: No    Emotionally Abused: No    Physically Abused: No    Sexually Abused: No  Depression (PHQ2-9):  Low Risk (11/01/2024)   Depression (PHQ2-9)    PHQ-2 Score: 0  Alcohol Screen: Low Risk (10/28/2024)   Alcohol Screen    Last Alcohol Screening Score (AUDIT): 4  Housing: Low Risk (11/01/2024)   Epic    Unable to Pay for Housing in the Last Year: No    Number of Times Moved in the Last Year: 0    Homeless in the Last Year: No  Utilities: Not At Risk (11/01/2024)   Epic    Threatened with loss of utilities: No  Health Literacy: Adequate Health Literacy (11/01/2024)   B1300 Health Literacy    Frequency of need for help with medical instructions: Never    Physical Exam: Vital signs in last 24 hours: BP (!) 146/74   Pulse (!) 56   Temp (!) 97.2 F (36.2 C) (Skin)   Wt 140 lb (63.5 kg)   SpO2 98%   BMI 25.61 kg/m  GEN: NAD EYE: Sclerae anicteric ENT: MMM CV: Non-tachycardic Pulm: No increased work of breathing GI: Soft, NT/ND NEURO:  Alert & Oriented   Estefana Kidney, MD [facility]  01/04/2025 2:37 PM  "

## 2025-01-04 NOTE — Progress Notes (Signed)
 Called to room to assist during endoscopic procedure.  Patient ID and intended procedure confirmed with present staff. Received instructions for my participation in the procedure from the performing physician.

## 2025-01-04 NOTE — Op Note (Signed)
 [facility] Patient Name: Pamela [name] Procedure Date: 01/04/2025 2:36 PM MRN: [id number] Endoscopist: Rosario Estefana Kidney , , [id number] Age: 79 Referring MD:  Date of Birth: Nov 23, 1946 Gender: Female Account #: [id number] Procedure:                Colonoscopy Indications:              Screening for colorectal malignant neoplasm Medicines:                Monitored Anesthesia Care Procedure:                Pre-Anesthesia Assessment:                           - Prior to the procedure, a History and Physical                            was performed, and patient medications and                            allergies were reviewed. The patient's tolerance of                            previous anesthesia was also reviewed. The risks                            and benefits of the procedure and the sedation                            options and risks were discussed with the patient.                            All questions were answered, and informed consent                            was obtained. Prior Anticoagulants: The patient has                            taken no anticoagulant or antiplatelet agents. ASA                            Grade Assessment: II - A patient with mild systemic                            disease. After reviewing the risks and benefits,                            the patient was deemed in satisfactory condition to                            undergo the procedure.                           After obtaining informed consent, the colonoscope  was passed under direct vision. Throughout the                            procedure, the patient's blood pressure, pulse, and                            oxygen saturations were monitored continuously. The                            Olympus Scope [id number] was introduced through the                            anus and advanced to the the terminal ileum. The                            colonoscopy  was performed without difficulty. The                            patient tolerated the procedure well. The quality                            of the bowel preparation was excellent. The                            terminal ileum, ileocecal valve, appendiceal                            orifice, and rectum were photographed. Scope In: 2:55:23 PM Scope Out: 3:11:32 PM Scope Withdrawal Time: 0 hours 13 minutes 21 seconds  Total Procedure Duration: 0 hours 16 minutes 9 seconds  Findings:                 The terminal ileum appeared normal.                           Two sessile polyps were found in the ascending                            colon. The polyps were 3 to 5 mm in size. These                            polyps were removed with a cold snare. Resection                            and retrieval were complete.                           A 2 mm polyp was found in the transverse colon. The                            polyp was sessile. The polyp was removed with a                            cold biopsy  forceps. Resection and retrieval were                            complete.                           A 4 mm polyp was found in the descending colon. The                            polyp was sessile. The polyp was removed with a                            cold snare. Resection and retrieval were complete.                           Multiple diverticula were found in the sigmoid                            colon.                           Non-bleeding internal hemorrhoids were found during                            retroflexion. Complications:            No immediate complications. Estimated Blood Loss:     Estimated blood loss was minimal. Impression:               - The examined portion of the ileum was normal.                           - Two 3 to 5 mm polyps in the ascending colon,                            removed with a cold snare. Resected and retrieved.                           - One 2 mm  polyp in the transverse colon, removed                            with a cold biopsy forceps. Resected and retrieved.                           - One 4 mm polyp in the descending colon, removed                            with a cold snare. Resected and retrieved.                           - Diverticulosis in the sigmoid colon.                           - Non-bleeding internal hemorrhoids. Recommendation:           -  Discharge patient to home (with escort).                           - Await pathology results.                           - The findings and recommendations were discussed                            with the patient. Dr Estefana Federico Rosario Estefana Federico,  01/04/2025 3:15:12 PM

## 2025-01-04 NOTE — Progress Notes (Signed)
 Vss nad trans to pacu

## 2025-01-04 NOTE — Progress Notes (Signed)
 Pt's states no medical or surgical changes since previsit or office visit.

## 2025-01-04 NOTE — Patient Instructions (Signed)
Resume previous diet. Await pathology results.   YOU HAD AN ENDOSCOPIC PROCEDURE TODAY AT THE [facility]:   Refer to the procedure report that was given to you for any specific questions about what was found during the examination.  If the procedure report does not answer your questions, please call your gastroenterologist to clarify.  If you requested that your care partner not be given the details of your procedure findings, then the procedure report has been included in a sealed envelope for you to review at your convenience later.  YOU SHOULD EXPECT: Some feelings of bloating in the abdomen. Passage of more gas than usual.  Walking can help get rid of the air that was put into your GI tract during the procedure and reduce the bloating. If you had a lower endoscopy (such as a colonoscopy or flexible sigmoidoscopy) you may notice spotting of blood in your stool or on the toilet paper. If you underwent a bowel prep for your procedure, you may not have a normal bowel movement for a few days.  Please Note:  You might notice some irritation and congestion in your nose or some drainage.  This is from the oxygen used during your procedure.  There is no need for concern and it should clear up in a day or so.  SYMPTOMS TO REPORT IMMEDIATELY:  Following lower endoscopy (colonoscopy or flexible sigmoidoscopy):  Excessive amounts of blood in the stool  Significant tenderness or worsening of abdominal pains  Swelling of the abdomen that is new, acute  Fever of 100F or higher  For urgent or emergent issues, a gastroenterologist can be reached at any hour by calling [phone]. Do not use MyChart messaging for urgent concerns.    DIET:  We do recommend a small meal at first, but then you may proceed to your regular diet.  Drink plenty of fluids but you should avoid alcoholic beverages for 24 hours.  ACTIVITY:  You should plan to take it easy for the rest of today and you should NOT  DRIVE or use heavy machinery until tomorrow (because of the sedation medicines used during the test).    FOLLOW UP: Our staff will call the number listed on your records the next business day following your procedure.  We will call around 7:15- 8:00 am to check on you and address any questions or concerns that you may have regarding the information given to you following your procedure. If we do not reach you, we will leave a message.     If any biopsies were taken you will be contacted by phone or by letter within the next 1-3 weeks.  Please call us at [phone] if you have not heard about the biopsies in 3 weeks.    SIGNATURES/CONFIDENTIALITY: You and/or your care partner have signed paperwork which will be entered into your electronic medical record.  These signatures attest to the fact that that the information above on your After Visit Summary has been reviewed and is understood.  Full responsibility of the confidentiality of this discharge information lies with you and/or your care-partner.
